# Patient Record
Sex: Male | Born: 1960
Health system: Southern US, Community
[De-identification: ages and names within clinical notes are randomized; demographics above are authoritative.]

## PROBLEM LIST (undated history)

## (undated) DIAGNOSIS — I10 Essential (primary) hypertension: Secondary | ICD-10-CM

## (undated) DIAGNOSIS — M5106 Intervertebral disc disorders with myelopathy, lumbar region: Secondary | ICD-10-CM

## (undated) DIAGNOSIS — G5621 Lesion of ulnar nerve, right upper limb: Secondary | ICD-10-CM

## (undated) DIAGNOSIS — M961 Postlaminectomy syndrome, not elsewhere classified: Secondary | ICD-10-CM

## (undated) DIAGNOSIS — M109 Gout, unspecified: Secondary | ICD-10-CM

## (undated) DIAGNOSIS — M7581 Other shoulder lesions, right shoulder: Secondary | ICD-10-CM

## (undated) DIAGNOSIS — N189 Chronic kidney disease, unspecified: Secondary | ICD-10-CM

## (undated) DIAGNOSIS — M24111 Other articular cartilage disorders, right shoulder: Secondary | ICD-10-CM

## (undated) DIAGNOSIS — N400 Enlarged prostate without lower urinary tract symptoms: Secondary | ICD-10-CM

## (undated) DIAGNOSIS — M5431 Sciatica, right side: Secondary | ICD-10-CM

## (undated) DIAGNOSIS — M51369 Other intervertebral disc degeneration, lumbar region without mention of lumbar back pain or lower extremity pain: Secondary | ICD-10-CM

## (undated) DIAGNOSIS — Z87442 Personal history of urinary calculi: Secondary | ICD-10-CM

## (undated) DIAGNOSIS — E785 Hyperlipidemia, unspecified: Secondary | ICD-10-CM

## (undated) HISTORY — PX: LUMBAR FUSION: SHX111

## (undated) HISTORY — PX: CHOLECYSTECTOMY: SHX55

## (undated) HISTORY — PX: BACK SURGERY: SHX140

---

## 2001-10-03 HISTORY — PX: SHOULDER SURGERY: SHX246

## 2008-11-30 ENCOUNTER — Emergency Department: Payer: Self-pay | Admitting: Emergency Medicine

## 2009-10-14 ENCOUNTER — Ambulatory Visit: Payer: Self-pay | Admitting: General Practice

## 2011-01-09 ENCOUNTER — Emergency Department: Payer: Self-pay | Admitting: Emergency Medicine

## 2011-01-11 ENCOUNTER — Emergency Department: Payer: Self-pay | Admitting: Emergency Medicine

## 2011-05-27 ENCOUNTER — Ambulatory Visit: Payer: Self-pay | Admitting: Urology

## 2011-09-22 ENCOUNTER — Emergency Department: Payer: Self-pay | Admitting: Internal Medicine

## 2011-11-07 ENCOUNTER — Ambulatory Visit: Payer: Self-pay | Admitting: Urology

## 2012-02-08 ENCOUNTER — Ambulatory Visit: Payer: Self-pay | Admitting: General Practice

## 2012-06-25 ENCOUNTER — Ambulatory Visit: Payer: Self-pay | Admitting: General Practice

## 2012-07-04 ENCOUNTER — Emergency Department: Payer: Self-pay | Admitting: Unknown Physician Specialty

## 2012-07-04 LAB — BASIC METABOLIC PANEL
Calcium, Total: 8.9 mg/dL (ref 8.5–10.1)
Co2: 19 mmol/L — ABNORMAL LOW (ref 21–32)
EGFR (Non-African Amer.): 58 — ABNORMAL LOW
Osmolality: 285 (ref 275–301)
Potassium: 4.4 mmol/L (ref 3.5–5.1)
Sodium: 140 mmol/L (ref 136–145)

## 2012-07-04 LAB — URINALYSIS, COMPLETE
Bacteria: NONE SEEN
Bilirubin,UR: NEGATIVE
Glucose,UR: NEGATIVE mg/dL (ref 0–75)
Ketone: NEGATIVE
Nitrite: NEGATIVE
Ph: 5 (ref 4.5–8.0)
Specific Gravity: 1.02 (ref 1.003–1.030)
Squamous Epithelial: <1
WBC UR: 2 /HPF (ref 0–5)

## 2012-07-04 LAB — CBC
MCH: 29.5 pg (ref 26.0–34.0)
MCHC: 34.2 g/dL (ref 32.0–36.0)
MCV: 86 fL (ref 80–100)
Platelet: 294 10*3/uL (ref 150–440)
RDW: 14.7 % — ABNORMAL HIGH (ref 11.5–14.5)

## 2012-07-07 ENCOUNTER — Emergency Department: Payer: Self-pay | Admitting: Unknown Physician Specialty

## 2012-07-07 LAB — URINALYSIS, COMPLETE
Bacteria: NONE SEEN
Bilirubin,UR: NEGATIVE
Glucose,UR: NEGATIVE mg/dL (ref 0–75)
Ketone: NEGATIVE
Leukocyte Esterase: NEGATIVE
Nitrite: NEGATIVE
Ph: 5 (ref 4.5–8.0)
Specific Gravity: 1.02 (ref 1.003–1.030)
Squamous Epithelial: NONE SEEN
WBC UR: NONE SEEN /HPF (ref 0–5)

## 2012-07-07 LAB — COMPREHENSIVE METABOLIC PANEL
Albumin: 3.4 g/dL (ref 3.4–5.0)
Alkaline Phosphatase: 86 U/L (ref 50–136)
Chloride: 110 mmol/L — ABNORMAL HIGH (ref 98–107)
Co2: 24 mmol/L (ref 21–32)
Creatinine: 1.66 mg/dL — ABNORMAL HIGH (ref 0.60–1.30)
EGFR (African American): 54 — ABNORMAL LOW
EGFR (Non-African Amer.): 47 — ABNORMAL LOW
Osmolality: 291 (ref 275–301)
SGOT(AST): 22 U/L (ref 15–37)
SGPT (ALT): 33 U/L (ref 12–78)
Sodium: 144 mmol/L (ref 136–145)
Total Protein: 6.9 g/dL (ref 6.4–8.2)

## 2012-07-07 LAB — CBC
HCT: 42.3 % (ref 40.0–52.0)
HGB: 14.4 g/dL (ref 13.0–18.0)
MCHC: 34 g/dL (ref 32.0–36.0)
RDW: 14.6 % — ABNORMAL HIGH (ref 11.5–14.5)
WBC: 12.1 10*3/uL — ABNORMAL HIGH (ref 3.8–10.6)

## 2012-07-11 ENCOUNTER — Ambulatory Visit: Payer: Self-pay | Admitting: General Practice

## 2013-01-20 ENCOUNTER — Emergency Department: Payer: Self-pay | Admitting: Unknown Physician Specialty

## 2013-01-20 LAB — URINALYSIS, COMPLETE
Bacteria: NONE SEEN
Leukocyte Esterase: NEGATIVE
Ph: 5 (ref 4.5–8.0)
Protein: NEGATIVE
Specific Gravity: 1.021 (ref 1.003–1.030)
Squamous Epithelial: NONE SEEN
WBC UR: 3 /HPF (ref 0–5)

## 2013-01-20 LAB — BASIC METABOLIC PANEL
Anion Gap: 7 (ref 7–16)
Chloride: 107 mmol/L (ref 98–107)
Co2: 23 mmol/L (ref 21–32)
Creatinine: 1.88 mg/dL — ABNORMAL HIGH (ref 0.60–1.30)
Glucose: 134 mg/dL — ABNORMAL HIGH (ref 65–99)
Potassium: 4 mmol/L (ref 3.5–5.1)
Sodium: 137 mmol/L (ref 136–145)

## 2013-01-20 LAB — CBC
HCT: 48.5 % (ref 40.0–52.0)
HGB: 16.3 g/dL (ref 13.0–18.0)
MCHC: 33.6 g/dL (ref 32.0–36.0)
MCV: 87 fL (ref 80–100)
RDW: 15.8 % — ABNORMAL HIGH (ref 11.5–14.5)
WBC: 15.9 10*3/uL — ABNORMAL HIGH (ref 3.8–10.6)

## 2013-01-21 ENCOUNTER — Emergency Department: Payer: Self-pay | Admitting: Emergency Medicine

## 2013-01-23 ENCOUNTER — Ambulatory Visit: Payer: Self-pay | Admitting: Urology

## 2013-01-24 ENCOUNTER — Ambulatory Visit: Payer: Self-pay | Admitting: Urology

## 2014-02-10 ENCOUNTER — Emergency Department: Payer: Self-pay | Admitting: Emergency Medicine

## 2014-02-10 LAB — COMPREHENSIVE METABOLIC PANEL
ALK PHOS: 76 U/L
ALT: 39 U/L (ref 12–78)
ANION GAP: 7 (ref 7–16)
Albumin: 4.1 g/dL (ref 3.4–5.0)
BILIRUBIN TOTAL: 1.6 mg/dL — AB (ref 0.2–1.0)
BUN: 16 mg/dL (ref 7–18)
CHLORIDE: 109 mmol/L — AB (ref 98–107)
CO2: 23 mmol/L (ref 21–32)
CREATININE: 1.52 mg/dL — AB (ref 0.60–1.30)
Calcium, Total: 9.3 mg/dL (ref 8.5–10.1)
EGFR (African American): 60 — ABNORMAL LOW
EGFR (Non-African Amer.): 52 — ABNORMAL LOW
GLUCOSE: 142 mg/dL — AB (ref 65–99)
Osmolality: 281 (ref 275–301)
POTASSIUM: 4.4 mmol/L (ref 3.5–5.1)
SGOT(AST): 36 U/L (ref 15–37)
Sodium: 139 mmol/L (ref 136–145)
Total Protein: 7.9 g/dL (ref 6.4–8.2)

## 2014-02-10 LAB — CBC
HCT: 54.6 % — AB (ref 40.0–52.0)
HGB: 17.5 g/dL (ref 13.0–18.0)
MCH: 28.5 pg (ref 26.0–34.0)
MCHC: 32 g/dL (ref 32.0–36.0)
MCV: 89 fL (ref 80–100)
PLATELETS: 301 10*3/uL (ref 150–440)
RBC: 6.12 10*6/uL — ABNORMAL HIGH (ref 4.40–5.90)
RDW: 14.6 % — ABNORMAL HIGH (ref 11.5–14.5)
WBC: 21.8 10*3/uL — ABNORMAL HIGH (ref 3.8–10.6)

## 2014-02-10 LAB — URINALYSIS, COMPLETE
BILIRUBIN, UR: NEGATIVE
Bacteria: NONE SEEN
GLUCOSE, UR: NEGATIVE mg/dL (ref 0–75)
Hyaline Cast: 2
Ketone: NEGATIVE
Leukocyte Esterase: NEGATIVE
Nitrite: NEGATIVE
Ph: 5 (ref 4.5–8.0)
Protein: 30
RBC,UR: 18 /HPF (ref 0–5)
SPECIFIC GRAVITY: 1.017 (ref 1.003–1.030)
Squamous Epithelial: NONE SEEN
WBC UR: 6 /HPF (ref 0–5)

## 2014-02-10 LAB — LIPASE, BLOOD: LIPASE: 119 U/L (ref 73–393)

## 2014-02-12 LAB — URINE CULTURE

## 2014-03-24 ENCOUNTER — Ambulatory Visit: Payer: Self-pay | Admitting: General Practice

## 2014-05-28 ENCOUNTER — Ambulatory Visit: Payer: Self-pay | Admitting: General Practice

## 2014-06-03 ENCOUNTER — Ambulatory Visit: Payer: Self-pay | Admitting: General Practice

## 2015-01-23 NOTE — Op Note (Signed)
PATIENT NAME:  John John [name], John John [name] MR#:  [id number] DATE OF BIRTH:  1961/06/17  DATE OF PROCEDURE:  01/24/2013  PREOPERATIVE DIAGNOSES:  1. Right distal ureteral calculus.  2. Right renal colic.   POSTOPERATIVE DIAGNOSES:  1. Right distal ureteral calculus.  2. Right renal colic.   PROCEDURES:  1. Right ureteroscopy with holmium laser lithotripsy/stone extraction.  2. Placement right ureteral stent.   SURGEON: John C. Lonna CobbStoioff, MD   ASSISTANT: None.   ANESTHESIA: General.   INDICATIONS: John [name] 54 year old male with John [name] history of recurrent stone disease presented with an 8-day history of severe, intermittent right renal colic. He has had 2 Emergency Department visits. He denies fevers or chills. CT remarkable for John [name] 5 mm right distal ureteral calculus. After discussion of treatment options, he has elected ureteroscopy.   DESCRIPTION: He was taken to the Operating Room, where John [name] general anesthetic was administered. He was placed in the low lithotomy position, and his external genitalia were prepped and draped in the usual sterile fashion. John [name] 21 French cystoscope with 30 degree lens was lubricated and passed under direct vision. The urethra was normal in caliber without stricture. The prostate was remarkable for mild to moderate lateral lobe enlargement. Bladder mucosa was closely inspected, and there was no erythema, solid or papillary lesions. The left ureteral orifice was normal in appearance with clear efflux. No efflux was seen from the right ureteral orifice. John [name] 0.035 Glidewire was placed through the cystoscope into the right ureteral orifice and could not be negotiated past the stone into the distal ureter. John [name] 5 French open-ended ureteral catheter was placed over the wire at the ureteral orifice, and the wire was able to be negotiated past the stone into the right renal pelvis. There was John [name] large rush of urine seen after wire placement. The cystoscope was removed, and John [name] 6 French semirigid ureteroscope was  passed per urethra, and the right ureteral orifice was engaged without dilation. The stone was seen in the distal ureter with John [name] fairly significant degree of inflammation. John [name] 365 micron holmium laser fiber was placed through the ureteroscope, and the stone was easily fragmented at John [name] power setting of 5 watts. All fragments were removed with aid of John [name] parachute basket. The ureteroscope was repassed up to the proximal ureter, and no other fragments were seen. Due to the degree of ureteral edema, it was elected to place John [name] stent, and John [name] 6 French/26 cm Microvasive Contour ureteral stent was placed. There was good curl seen in the renal pelvis under fluoroscopy, and the distal end of the stent was well positioned in the bladder under direct vision. The stent was left attached to John [name] string and will be removed at John [name] later date. B and O suppository was placed per rectum. He was taken to PACU in stable condition. There were no complications. EBL minimal.    ____________________________ John CzechScott C. Lonna CobbStoioff, MD scs:OSi D: 01/25/2013 17:26:18 ET T: 01/26/2013 06:43:04 ET JOB#: [id number]  cc: Lorin PicketScott C. Lonna CobbStoioff, MD, <Dictator> John AltesSCOTT C STOIOFF MD ELECTRONICALLY SIGNED 02/06/2013 7:33

## 2015-12-24 ENCOUNTER — Encounter: Payer: Self-pay | Admitting: Emergency Medicine

## 2015-12-24 ENCOUNTER — Emergency Department: Payer: BLUE CROSS/BLUE SHIELD

## 2015-12-24 ENCOUNTER — Emergency Department
Admission: EM | Admit: 2015-12-24 | Discharge: 2015-12-24 | Disposition: A | Discharge status: Home / Self Care | Payer: BLUE CROSS/BLUE SHIELD | Attending: Emergency Medicine | Admitting: Emergency Medicine

## 2015-12-24 DIAGNOSIS — Z79899 Other long term (current) drug therapy: Secondary | ICD-10-CM | POA: Insufficient documentation

## 2015-12-24 DIAGNOSIS — N201 Calculus of ureter: Secondary | ICD-10-CM | POA: Insufficient documentation

## 2015-12-24 DIAGNOSIS — R109 Unspecified abdominal pain: Secondary | ICD-10-CM

## 2015-12-24 HISTORY — DX: Hyperlipidemia, unspecified: E78.5

## 2015-12-24 LAB — CBC WITH DIFFERENTIAL/PLATELET
BASOS PCT: 1 %
Basophils Absolute: 0.1 10*3/uL (ref 0–0.1)
EOS ABS: 0.3 10*3/uL (ref 0–0.7)
EOS PCT: 3 %
HCT: 51.2 % (ref 40.0–52.0)
Hemoglobin: 17.4 g/dL (ref 13.0–18.0)
LYMPHS ABS: 1.5 10*3/uL (ref 1.0–3.6)
Lymphocytes Relative: 14 %
MCH: 29.4 pg (ref 26.0–34.0)
MCHC: 34 g/dL (ref 32.0–36.0)
MCV: 86.6 fL (ref 80.0–100.0)
MONOS PCT: 7 %
Monocytes Absolute: 0.8 10*3/uL (ref 0.2–1.0)
Neutro Abs: 8 10*3/uL — ABNORMAL HIGH (ref 1.4–6.5)
Neutrophils Relative %: 75 %
PLATELETS: 263 10*3/uL (ref 150–440)
RBC: 5.91 MIL/uL — ABNORMAL HIGH (ref 4.40–5.90)
RDW: 14.8 % — AB (ref 11.5–14.5)
WBC: 10.7 10*3/uL — ABNORMAL HIGH (ref 3.8–10.6)

## 2015-12-24 LAB — COMPREHENSIVE METABOLIC PANEL
ALBUMIN: 4.4 g/dL (ref 3.5–5.0)
ALT: 60 U/L (ref 17–63)
ANION GAP: 7 (ref 5–15)
AST: 40 U/L (ref 15–41)
Alkaline Phosphatase: 80 U/L (ref 38–126)
BUN: 17 mg/dL (ref 6–20)
CALCIUM: 9.3 mg/dL (ref 8.9–10.3)
CHLORIDE: 107 mmol/L (ref 101–111)
CO2: 22 mmol/L (ref 22–32)
Creatinine, Ser: 1.49 mg/dL — ABNORMAL HIGH (ref 0.61–1.24)
GFR calc non Af Amer: 51 mL/min — ABNORMAL LOW (ref >60)
GFR, EST AFRICAN AMERICAN: 59 mL/min — AB (ref >60)
Glucose, Bld: 127 mg/dL — ABNORMAL HIGH (ref 65–99)
POTASSIUM: 4.4 mmol/L (ref 3.5–5.1)
SODIUM: 136 mmol/L (ref 135–145)
Total Bilirubin: 1.5 mg/dL — ABNORMAL HIGH (ref 0.3–1.2)
Total Protein: 7.5 g/dL (ref 6.5–8.1)

## 2015-12-24 MED ORDER — IBUPROFEN 800 MG PO TABS: 800.0000 mg | ORAL_TABLET | Freq: Three times a day (TID) | ORAL | Status: DC | PRN

## 2015-12-24 MED ORDER — TAMSULOSIN HCL 0.4 MG PO CAPS
0.4000 mg | ORAL_CAPSULE | Freq: Once | ORAL | Status: AC
  Administered 2015-12-24: 0.4 mg via ORAL
  Filled 2015-12-24: qty 1

## 2015-12-24 MED ORDER — HYDROMORPHONE HCL 1 MG/ML IJ SOLN
1.0000 mg | Freq: Once | INTRAMUSCULAR | Status: AC
  Administered 2015-12-24: 1 mg via INTRAVENOUS

## 2015-12-24 MED ORDER — ONDANSETRON HCL 4 MG/2ML IJ SOLN
INTRAMUSCULAR | Status: AC
  Administered 2015-12-24: 4 mg
  Filled 2015-12-24: qty 2

## 2015-12-24 MED ORDER — TAMSULOSIN HCL 0.4 MG PO CAPS: 0.4000 mg | ORAL_CAPSULE | Freq: Every day | ORAL | Status: DC

## 2015-12-24 MED ORDER — HYDROMORPHONE HCL 1 MG/ML IJ SOLN
INTRAMUSCULAR | Status: AC
  Administered 2015-12-24: 1 mg via INTRAVENOUS
  Filled 2015-12-24: qty 1

## 2015-12-24 MED ORDER — ONDANSETRON HCL 4 MG/2ML IJ SOLN
4.0000 mg | Freq: Once | INTRAMUSCULAR | Status: AC
  Administered 2015-12-24: 4 mg via INTRAVENOUS

## 2015-12-24 MED ORDER — OXYCODONE-ACETAMINOPHEN 5-325 MG PO TABS: 2.0000 | ORAL_TABLET | ORAL | Status: DC | PRN

## 2015-12-24 MED ORDER — ONDANSETRON 4 MG PO TBDP: 4.0000 mg | ORAL_TABLET | Freq: Three times a day (TID) | ORAL | Status: DC | PRN

## 2015-12-24 MED ORDER — SODIUM CHLORIDE 0.9 % IV BOLUS (SEPSIS)
1000.0000 mL | Freq: Once | INTRAVENOUS | Status: AC
  Administered 2015-12-24: 1000 mL via INTRAVENOUS

## 2015-12-24 MED ORDER — ONDANSETRON HCL 4 MG/2ML IJ SOLN
INTRAMUSCULAR | Status: AC
  Filled 2015-12-24: qty 2

## 2015-12-24 MED ORDER — KETOROLAC TROMETHAMINE 30 MG/ML IJ SOLN
30.0000 mg | INTRAMUSCULAR | Status: AC
  Administered 2015-12-24: 30 mg via INTRAVENOUS
  Filled 2015-12-24: qty 1

## 2015-12-24 MED ORDER — HYDROMORPHONE HCL 1 MG/ML IJ SOLN
INTRAMUSCULAR | Status: AC
  Filled 2015-12-24: qty 1

## 2015-12-24 NOTE — ED Provider Notes (Signed)
[facility] Emergency Department Provider Note ____________________________________________  Time seen: Approximately 9:00 AM  I have reviewed the triage vital signs and the nursing notes.   HISTORY  Chief Complaint Flank Pain  HPI John [name] is a 55 y.o. male who is complaining of severe right posterior lateral flank pain radiating into his right groin that has progressively worsened over the past several hours. Patient states that he did have some vomiting at home and was not able to hold any of his pain medications down. Patient has history of multiple kidney stones in which he passes several at home, but occasionally has had have a procedure done to remove a kidney stone. Patient states that this pain feels just like his kidney stone and on a scale of 0-10 is about a 10. Patient denies any dysuria frequency or hematuria, but he says his urine looked normal. Patient has not had any significant recent surgeries are new medications. Patient states that nothing seems to make it better or worse. She also denies any constipation or diarrhea. Patient denies any fever or chills or muscle aches.   Past Medical History  Diagnosis Date  . Hyperlipidemia     There are no active problems to display for this patient.   History reviewed. No pertinent past surgical history.  Current Outpatient Rx  Name  Route  Sig  Dispense  Refill  . atorvastatin (LIPITOR) 40 MG tablet   Oral   Take 40 mg by mouth daily.         . cyclobenzaprine (FLEXERIL) 10 MG tablet   Oral   Take 10 mg by mouth 3 (three) times daily as needed for muscle spasms.         Marland Kitchen ibuprofen (ADVIL,MOTRIN) 200 MG tablet   Oral   Take 400 mg by mouth every 6 (six) hours as needed.         Marland Kitchen oxyCODONE-acetaminophen (PERCOCET/ROXICET) 5-325 MG tablet   Oral   Take 1 tablet by mouth. Every 4-6 hours as needed for kidney stone pain         . ibuprofen (ADVIL,MOTRIN) 800 MG tablet   Oral   Take 1  tablet (800 mg total) by mouth every 8 (eight) hours as needed.   30 tablet   0   . ondansetron (ZOFRAN ODT) 4 MG disintegrating tablet   Oral   Take 1 tablet (4 mg total) by mouth every 8 (eight) hours as needed for nausea or vomiting.   20 tablet   0   . oxyCODONE-acetaminophen (ROXICET) 5-325 MG tablet   Oral   Take 2 tablets by mouth every 4 (four) hours as needed for severe pain.   24 tablet   0   . tamsulosin (FLOMAX) 0.4 MG CAPS capsule   Oral   Take 1 capsule (0.4 mg total) by mouth daily.   14 capsule   0     Allergies Review of patient's allergies indicates no known allergies.  No family history on file.  Social History Social History  Substance Use Topics  . Smoking status: Never Smoker   . Smokeless tobacco: None  . Alcohol Use: No    Review of Systems Constitutional: No fever/chills Eyes: No visual changes. ENT: No sore throat. Cardiovascular: Denies chest pain. Respiratory: Denies shortness of breath. Gastrointestinal: Patient with severe right flank pain that radiates down into the right groin.  Patient with nausea and vomiting..  No diarrhea.  No constipation. Genitourinary: Negative for dysuria, frequency,  or hematuria, but urine is dark. Musculoskeletal: Pain is in his mid right posterior lateral flank.. Skin: Negative for rash. Neurological: Negative for headaches, focal weakness or numbness. 10-point ROS otherwise negative.  ____________________________________________   PHYSICAL EXAM:  VITAL SIGNS: ED Triage Vitals  Enc Vitals Group     BP 12/24/15 0848 159/83 mmHg     Pulse Rate 12/24/15 0848 83     Resp 12/24/15 0848 20     Temp 12/24/15 0856 97.7 F (36.5 C)     Temp Source 12/24/15 0848 Oral     SpO2 12/24/15 0848 100 %     Weight 12/24/15 0848 220 lb (99.791 kg)     Height 12/24/15 0848 6\' 2"  (1.88 m)     Head Cir --      Peak Flow --      Pain Score 12/24/15 0848 10     Pain Loc --      Pain Edu? --      Excl. in GC?  --     Constitutional: Alert and oriented. Well appearing but in significant distress secondary to his pain. Eyes: Conjunctivae are normal. PERRL. EOMI. Head: Atraumatic. Nose: No congestion/rhinnorhea. Mouth/Throat: Mucous membranes are moist.  Oropharynx non-erythematous. Neck: No stridor.   Cardiovascular: Normal rate, regular rhythm. Grossly normal heart sounds.  Good peripheral circulation. Respiratory: Normal respiratory effort.  No retractions. Lungs CTAB. Gastrointestinal: Soft and nontender. No distention. No abdominal bruits. No CVA tenderness. The patient was significant tenderness palpation to his posterior lateral right flank into his right lower quadrant. There is no rebound guarding or peritoneal signs. Musculoskeletal: No lower extremity tenderness nor edema.  No joint effusions. Neurologic:  Normal speech and language. No gross focal neurologic deficits are appreciated. No gait instability. Skin:  Skin is warm, dry and intact. No rash noted. Psychiatric: Mood and affect are normal. Speech and behavior are normal.  ____________________________________________   LABS (all labs ordered are listed, but only abnormal results are displayed)  Labs Reviewed  CBC WITH DIFFERENTIAL/PLATELET - Abnormal; Notable for the following:    WBC 10.7 (*)    RBC 5.91 (*)    RDW 14.8 (*)    Neutro Abs 8.0 (*)    All other components within normal limits  COMPREHENSIVE METABOLIC PANEL - Abnormal; Notable for the following:    Glucose, Bld 127 (*)    Creatinine, Ser 1.49 (*)    Total Bilirubin 1.5 (*)    GFR calc non Af Amer 51 (*)    GFR calc Af Amer 59 (*)    All other components within normal limits  URINALYSIS COMPLETEWITH MICROSCOPIC ([facility] ONLY)   ____________________________________________  EKG  None ____________________________________________  RADIOLOGY Ct Renal Stone Study  12/24/2015  CLINICAL DATA:  Left-sided flank pain for several hours, initial encounter EXAM:  CT ABDOMEN AND PELVIS WITHOUT CONTRAST TECHNIQUE: Multidetector CT imaging of the abdomen and pelvis was performed following the standard protocol without IV contrast. COMPARISON:  02/10/2014 FINDINGS: Lung bases are free of acute infiltrate or sizable effusion. The gallbladder has been surgically removed. The liver, spleen, adrenal glands and pancreas are within normal limits. The left kidney demonstrates multiple nonobstructing renal calculi measuring approximately 5 mm. No obstructive changes are seen. Cystic lesions are noted in the left kidney stable from the previous exam. The right kidney also shows cystic changes and multiple nonobstructing renal calculi. The largest of these measures approximately 4 mm. Dilatation of the collecting system is noted which extends to the level  of the mid to distal ureter. The mid ureter there are 2 obstructing stones identified. The larger of these measures 6 mm and is noted inferiorly causing the obstructive change. A smaller 3 mm stone is noted proximal. The more distal right ureter is within normal limits. The bladder is well distended. Diverticulosis is seen without evidence of diverticulitis. The appendix is unremarkable. No acute bony abnormality is seen. IMPRESSION: Bilateral renal cystic change and nonobstructing renal calculi as described. Two mid right ureteral stones with obstructive change Electronically Signed   By: Alcide Clever M.D.   On: 12/24/2015 09:43     ____________________________________________   PROCEDURES  Procedure(s) performed: None  Critical Care performed: No  ____________________________________________   INITIAL IMPRESSION / ASSESSMENT AND PLAN / ED COURSE  Pertinent labs & imaging results that were available during my care of the patient were reviewed by me and considered in my medical decision making (see chart for details). On arrival to the ER patient was given IV Dilaudid and Zofran for pain as well as started on a liter  of IV fluids. We'll get routine labs and urine and CT renal stone study to evaluate. 1:28 PM Dr. Len Childs was consulted approximately one hour ago for further evaluation of this patient but is still in surgery at this time. Patient was given another dose of pain and nausea medicine and we are awaiting his consult. Patient has 2 right ureter stones at this time and is still having a significant amount of pain.  1:28 PM Dr. Lonna Cobb was  consulted and he feels that we can go ahead and give patient some IV Toradol as well as some Flomax to see if we can get his pain under control. If we are able to get his pain under control and he feels the patient will be able to just follow-up as an outpatient.  1:28 PM Patient's pain is under control after the IV Toradol. Patient will be placed on by mouth Toradol, Percocet, Zofran, and Flomax at home. Patient was told to follow up with Dr. Rob Bunting L over the next couple days, and return immediately if condition worsens. ____________________________________________   FINAL CLINICAL IMPRESSION(S) / ED DIAGNOSES  Final diagnoses:  Right flank pain  Right ureteral stone      Leona Carry, MD 12/24/15 1329

## 2015-12-24 NOTE — ED Notes (Signed)
Pt reports right flank pain radiating into testicles. Pt appears uncomfortable, unable to stay still and grimacing.  See med note.  Small amount of blood noted in urine this am.  Reports long hx of kidney stones.

## 2015-12-24 NOTE — ED Notes (Signed)
AAOx3.  Skin warm and dry.  Ambulates with easy and steady gait.  Posture relaxed.   

## 2015-12-24 NOTE — ED Notes (Signed)
Pt to ed with c/o left flank pain acute onset at 530 this am.  Pt reports vomiting and difficulty urinating as well.  Hx of kidney stones.

## 2015-12-24 NOTE — ED Notes (Signed)
Pt reports pain got much better after dilaudid but pain returned.  New order for dilaudid given.

## 2015-12-26 DIAGNOSIS — R109 Unspecified abdominal pain: Secondary | ICD-10-CM | POA: Diagnosis present

## 2015-12-26 DIAGNOSIS — Z79899 Other long term (current) drug therapy: Secondary | ICD-10-CM | POA: Insufficient documentation

## 2015-12-26 DIAGNOSIS — N200 Calculus of kidney: Secondary | ICD-10-CM | POA: Insufficient documentation

## 2015-12-26 MED ORDER — KETOROLAC TROMETHAMINE 30 MG/ML IJ SOLN
INTRAMUSCULAR | Status: AC
  Administered 2015-12-26: 30 mg via INTRAVENOUS
  Filled 2015-12-26: qty 1

## 2015-12-26 MED ORDER — HYDROMORPHONE HCL 1 MG/ML IJ SOLN
1.0000 mg | Freq: Once | INTRAMUSCULAR | Status: AC
  Administered 2015-12-26: 1 mg via INTRAVENOUS

## 2015-12-26 MED ORDER — HYDROMORPHONE HCL 1 MG/ML IJ SOLN
INTRAMUSCULAR | Status: AC
  Administered 2015-12-26: 1 mg via INTRAVENOUS
  Filled 2015-12-26: qty 1

## 2015-12-26 MED ORDER — KETOROLAC TROMETHAMINE 30 MG/ML IJ SOLN
30.0000 mg | Freq: Once | INTRAMUSCULAR | Status: AC
  Administered 2015-12-26: 30 mg via INTRAVENOUS

## 2015-12-26 NOTE — ED Notes (Signed)
Pt reports seen here on Thursday and dx'd with 6mm and 3 mm kidney stone on the right. Has appoint to see Dr Lonna CobbStoioff on Tuesday. Pt reports pain became severe to his right flank area and radiating into his groin over the last few hours. Pt last took percocet x 2 tabs at 6 pm.

## 2015-12-27 ENCOUNTER — Emergency Department
Admission: EM | Admit: 2015-12-27 | Discharge: 2015-12-27 | Disposition: A | Discharge status: Home / Self Care | Payer: BLUE CROSS/BLUE SHIELD | Attending: Emergency Medicine | Admitting: Emergency Medicine

## 2015-12-27 DIAGNOSIS — N200 Calculus of kidney: Secondary | ICD-10-CM

## 2015-12-27 DIAGNOSIS — R109 Unspecified abdominal pain: Secondary | ICD-10-CM

## 2015-12-27 LAB — CBC WITH DIFFERENTIAL/PLATELET
BASOS ABS: 0.1 10*3/uL (ref 0–0.1)
Basophils Relative: 0 %
Eosinophils Absolute: 0.3 10*3/uL (ref 0–0.7)
Eosinophils Relative: 2 %
HEMATOCRIT: 40.8 % (ref 40.0–52.0)
Hemoglobin: 13.7 g/dL (ref 13.0–18.0)
LYMPHS PCT: 10 %
Lymphs Abs: 1.4 10*3/uL (ref 1.0–3.6)
MCH: 29.2 pg (ref 26.0–34.0)
MCHC: 33.5 g/dL (ref 32.0–36.0)
MCV: 87.2 fL (ref 80.0–100.0)
Monocytes Absolute: 1.3 10*3/uL — ABNORMAL HIGH (ref 0.2–1.0)
Monocytes Relative: 9 %
NEUTROS ABS: 10.3 10*3/uL — AB (ref 1.4–6.5)
NEUTROS PCT: 77 %
PLATELETS: 221 10*3/uL (ref 150–440)
RBC: 4.68 MIL/uL (ref 4.40–5.90)
RDW: 14.2 % (ref 11.5–14.5)
WBC: 13.3 10*3/uL — AB (ref 3.8–10.6)

## 2015-12-27 LAB — BASIC METABOLIC PANEL
ANION GAP: 3 — AB (ref 5–15)
BUN: 19 mg/dL (ref 6–20)
CO2: 23 mmol/L (ref 22–32)
Calcium: 8 mg/dL — ABNORMAL LOW (ref 8.9–10.3)
Chloride: 110 mmol/L (ref 101–111)
Creatinine, Ser: 2.28 mg/dL — ABNORMAL HIGH (ref 0.61–1.24)
GFR calc Af Amer: 35 mL/min — ABNORMAL LOW (ref >60)
GFR, EST NON AFRICAN AMERICAN: 31 mL/min — AB (ref >60)
GLUCOSE: 108 mg/dL — AB (ref 65–99)
POTASSIUM: 4.7 mmol/L (ref 3.5–5.1)
Sodium: 136 mmol/L (ref 135–145)

## 2015-12-27 LAB — URINALYSIS COMPLETE WITH MICROSCOPIC (ARMC ONLY)
BILIRUBIN URINE: NEGATIVE
Bacteria, UA: NONE SEEN
Glucose, UA: NEGATIVE mg/dL
Ketones, ur: NEGATIVE mg/dL
LEUKOCYTES UA: NEGATIVE
Nitrite: NEGATIVE
PH: 5 (ref 5.0–8.0)
Protein, ur: NEGATIVE mg/dL
SPECIFIC GRAVITY, URINE: 1.017 (ref 1.005–1.030)
SQUAMOUS EPITHELIAL / LPF: NONE SEEN
WBC, UA: NONE SEEN WBC/hpf (ref 0–5)

## 2015-12-27 MED ORDER — KETOROLAC TROMETHAMINE 30 MG/ML IJ SOLN
30.0000 mg | Freq: Once | INTRAMUSCULAR | Status: DC
  Filled 2015-12-27: qty 1

## 2015-12-27 MED ORDER — HYDROMORPHONE HCL 2 MG PO TABS
2.0000 mg | ORAL_TABLET | Freq: Once | ORAL | Status: AC
  Administered 2015-12-27: 2 mg via ORAL
  Filled 2015-12-27: qty 1

## 2015-12-27 MED ORDER — ONDANSETRON HCL 4 MG/2ML IJ SOLN
4.0000 mg | Freq: Once | INTRAMUSCULAR | Status: AC
  Administered 2015-12-27: 4 mg via INTRAVENOUS
  Filled 2015-12-27: qty 2

## 2015-12-27 MED ORDER — SODIUM CHLORIDE 0.9 % IV BOLUS (SEPSIS)
1000.0000 mL | Freq: Once | INTRAVENOUS | Status: AC
  Administered 2015-12-27: 1000 mL via INTRAVENOUS

## 2015-12-27 MED ORDER — HYDROMORPHONE HCL 2 MG PO TABS: 2.0000 mg | ORAL_TABLET | Freq: Four times a day (QID) | ORAL | Status: DC | PRN

## 2015-12-27 MED ORDER — HYDROMORPHONE HCL 1 MG/ML IJ SOLN
1.0000 mg | Freq: Once | INTRAMUSCULAR | Status: AC
  Administered 2015-12-27: 1 mg via INTRAVENOUS
  Filled 2015-12-27: qty 1

## 2015-12-27 NOTE — Discharge Instructions (Signed)
1. Discontinue toradol tablets. You may alternate Dilaudid tablets (#30) with Percocet to control your pain. 2. Drink plenty of fluids daily.  3. Return to the ER for worsening symptoms, persistent vomiting, fever, or other concerns.   Flank Pain Flank pain refers to pain that is located on the side of the body between the upper abdomen and the back. The pain may occur over a short period of time (acute) or may be long-term or reoccurring (chronic). It may be mild or severe. Flank pain can be caused by many things. CAUSES  Some of the more common causes of flank pain include:  Muscle strains.   Muscle spasms.   A disease of your spine (vertebral disk disease).   A lung infection (pneumonia).   Fluid around your lungs (pulmonary edema).   A kidney infection.   Kidney stones.   A very painful skin rash caused by the chickenpox virus (shingles).   Gallbladder disease.  HOME CARE INSTRUCTIONS  Home care will depend on the cause of your pain. In general,  Rest as directed by your caregiver.  Drink enough fluids to keep your urine clear or pale yellow.  Only take over-the-counter or prescription medicines as directed by your caregiver. Some medicines may help relieve the pain.  Tell your caregiver about any changes in your pain.  Follow up with your caregiver as directed. SEEK IMMEDIATE MEDICAL CARE IF:   Your pain is not controlled with medicine.   You have new or worsening symptoms.  Your pain increases.   You have abdominal pain.   You have shortness of breath.   You have persistent nausea or vomiting.   You have swelling in your abdomen.   You feel faint or pass out.   You have blood in your urine.  You have a fever or persistent symptoms for more than 2-3 days.  You have a fever and your symptoms suddenly get worse. MAKE SURE YOU:   Understand these instructions.  Will watch your condition.  Will get help right away if you are not  doing well or get worse.   This information is not intended to replace advice given to you by your health care provider. Make sure you discuss any questions you have with your health care provider.   Document Released: 11/10/2005 Document Revised: 06/13/2012 Document Reviewed: 05/03/2012 Elsevier Interactive Patient Education 2016 [employer]  Kidney Stones Kidney stones (urolithiasis) are deposits that form inside your kidneys. The intense pain is caused by the stone moving through the urinary tract. When the stone moves, the ureter goes into spasm around the stone. The stone is usually passed in the urine.  CAUSES   A disorder that makes certain neck glands produce too much parathyroid hormone (primary hyperparathyroidism).  A buildup of uric acid crystals, similar to gout in your joints.  Narrowing (stricture) of the ureter.  A kidney obstruction present at birth (congenital obstruction).  Previous surgery on the kidney or ureters.  Numerous kidney infections. SYMPTOMS   Feeling sick to your stomach (nauseous).  Throwing up (vomiting).  Blood in the urine (hematuria).  Pain that usually spreads (radiates) to the groin.  Frequency or urgency of urination. DIAGNOSIS   Taking a history and physical exam.  Blood or urine tests.  CT scan.  Occasionally, an examination of the inside of the urinary bladder (cystoscopy) is performed. TREATMENT   Observation.  Increasing your fluid intake.  Extracorporeal shock wave lithotripsy--This is a noninvasive procedure that uses shock waves  to break up kidney stones.  Surgery may be needed if you have severe pain or persistent obstruction. There are various surgical procedures. Most of the procedures are performed with the use of small instruments. Only small incisions are needed to accommodate these instruments, so recovery time is minimized. The size, location, and chemical composition are all important variables that will  determine the proper choice of action for you. Talk to your health care provider to better understand your situation so that you will minimize the risk of injury to yourself and your kidney.  HOME CARE INSTRUCTIONS   Drink enough water and fluids to keep your urine clear or pale yellow. This will help you to pass the stone or stone fragments.  Strain all urine through the provided strainer. Keep all particulate matter and stones for your health care provider to see. The stone causing the pain may be as small as a grain of salt. It is very important to use the strainer each and every time you pass your urine. The collection of your stone will allow your health care provider to analyze it and verify that a stone has actually passed. The stone analysis will often identify what you can do to reduce the incidence of recurrences.  Only take over-the-counter or prescription medicines for pain, discomfort, or fever as directed by your health care provider.  Keep all follow-up visits as told by your health care provider. This is important.  Get follow-up X-rays if required. The absence of pain does not always mean that the stone has passed. It may have only stopped moving. If the urine remains completely obstructed, it can cause loss of kidney function or even complete destruction of the kidney. It is your responsibility to make sure X-rays and follow-ups are completed. Ultrasounds of the kidney can show blockages and the status of the kidney. Ultrasounds are not associated with any radiation and can be performed easily in a matter of minutes.  Make changes to your daily diet as told by your health care provider. You may be told to:  Limit the amount of salt that you eat.  Eat 5 or more servings of fruits and vegetables each day.  Limit the amount of meat, poultry, fish, and eggs that you eat.  Collect a 24-hour urine sample as told by your health care provider.You may need to collect another urine  sample every 6-12 months. SEEK MEDICAL CARE IF:  You experience pain that is progressive and unresponsive to any pain medicine you have been prescribed. SEEK IMMEDIATE MEDICAL CARE IF:   Pain cannot be controlled with the prescribed medicine.  You have a fever or shaking chills.  The severity or intensity of pain increases over 18 hours and is not relieved by pain medicine.  You develop a new onset of abdominal pain.  You feel faint or pass out.  You are unable to urinate.   This information is not intended to replace advice given to you by your health care provider. Make sure you discuss any questions you have with your health care provider.   Document Released: 09/19/2005 Document Revised: 06/10/2015 Document Reviewed: 02/20/2013 Elsevier Interactive Patient Education [employer]2016 [employer]

## 2015-12-27 NOTE — ED Provider Notes (Signed)
[facility] Emergency Department Provider Note  ____________________________________________  Time seen: Approximately 12:52 AM  I have reviewed the triage vital signs and the nursing notes.   HISTORY  Chief Complaint Flank Pain and Nephrolithiasis    HPI John [name] is a 55 y.o. male who returns to the ED from home with a chief complaint of right flank pain. Patient has a history of kidney stones requiring lithotripsy (last in 2014) who was seen in the ED on 3/23 for similar complaint. On CT scan at that time, he was found to have 2 ureteral stones measuring 3 mm and 6 mm in size. Has a scheduled appointment with urologist in 3 days. Reports increased pain at home despite Percocet and Toradol use. Denies associated fever, chills, chest pain, shortness of breath, abdominal pain, urinary symptoms, nausea, vomiting, diarrhea, testicular pain or swelling. Denies recent travel or trauma. Nothing makes his pain better or worse.   Past Medical History  Diagnosis Date  . Hyperlipidemia   Nephrolithiasis  There are no active problems to display for this patient.   Past surgical history Lithotripsy  Current Outpatient Rx  Name  Route  Sig  Dispense  Refill  . atorvastatin (LIPITOR) 40 MG tablet   Oral   Take 40 mg by mouth daily.         . cyclobenzaprine (FLEXERIL) 10 MG tablet   Oral   Take 10 mg by mouth 3 (three) times daily as needed for muscle spasms.         Marland Kitchen ibuprofen (ADVIL,MOTRIN) 800 MG tablet   Oral   Take 1 tablet (800 mg total) by mouth every 8 (eight) hours as needed.   30 tablet   0   . ondansetron (ZOFRAN ODT) 4 MG disintegrating tablet   Oral   Take 1 tablet (4 mg total) by mouth every 8 (eight) hours as needed for nausea or vomiting.   20 tablet   0   . oxyCODONE-acetaminophen (ROXICET) 5-325 MG tablet   Oral   Take 2 tablets by mouth every 4 (four) hours as needed for severe pain.   24 tablet   0   . tamsulosin (FLOMAX)  0.4 MG CAPS capsule   Oral   Take 1 capsule (0.4 mg total) by mouth daily.   14 capsule   0     Allergies Review of patient's allergies indicates no known allergies.  Family history Father with nephrolithiasis  Social History Social History  Substance Use Topics  . Smoking status: Never Smoker   . Smokeless tobacco: Not on file  . Alcohol Use: No    Review of Systems  Constitutional: No fever/chills. Eyes: No visual changes. ENT: No sore throat. Cardiovascular: Denies chest pain. Respiratory: Denies shortness of breath. Gastrointestinal: No abdominal pain.  No nausea, no vomiting.  No diarrhea.  No constipation. Positive for flank pain. Genitourinary: Negative for dysuria. Musculoskeletal: Negative for back pain. Skin: Negative for rash. Neurological: Negative for headaches, focal weakness or numbness.  10-point ROS otherwise negative.  ____________________________________________   PHYSICAL EXAM:  VITAL SIGNS: ED Triage Vitals  Enc Vitals Group     BP 12/26/15 2140 156/97 mmHg     Pulse Rate 12/26/15 2140 112     Resp 12/26/15 2140 18     Temp 12/26/15 2140 98.2 F (36.8 C)     Temp Source 12/26/15 2140 Oral     SpO2 12/26/15 2140 96 %     Weight 12/26/15 2147 225  lb (102.059 kg)     Height 12/26/15 2147 6' (1.829 m)     Head Cir --      Peak Flow --      Pain Score 12/26/15 2147 10     Pain Loc --      Pain Edu? --      Excl. in GC? --     Constitutional: Alert and oriented. Well appearing and in no acute distress. Eyes: Conjunctivae are normal. PERRL. EOMI. Head: Atraumatic. Nose: No congestion/rhinnorhea. Mouth/Throat: Mucous membranes are moist.  Oropharynx non-erythematous. Neck: No stridor.   Cardiovascular: Normal rate, regular rhythm. Grossly normal heart sounds.  Good peripheral circulation. Respiratory: Normal respiratory effort.  No retractions. Lungs CTAB. Gastrointestinal: Soft and nontender. No distention. No abdominal bruits. No  CVA tenderness. Musculoskeletal: No lower extremity tenderness nor edema.  No joint effusions. Neurologic:  Normal speech and language. No gross focal neurologic deficits are appreciated. No gait instability. Skin:  Skin is warm, dry and intact. No rash noted. Psychiatric: Mood and affect are normal. Speech and behavior are normal.  ____________________________________________   LABS (all labs ordered are listed, but only abnormal results are displayed)  Labs Reviewed  CBC WITH DIFFERENTIAL/PLATELET - Abnormal; Notable for the following:    WBC 13.3 (*)    Neutro Abs 10.3 (*)    Monocytes Absolute 1.3 (*)    All other components within normal limits  BASIC METABOLIC PANEL - Abnormal; Notable for the following:    Glucose, Bld 108 (*)    Creatinine, Ser 2.28 (*)    Calcium 8.0 (*)    GFR calc non Af Amer 31 (*)    GFR calc Af Amer 35 (*)    Anion gap 3 (*)    All other components within normal limits  URINALYSIS COMPLETEWITH MICROSCOPIC ([facility] ONLY) - Abnormal; Notable for the following:    Color, Urine YELLOW (*)    APPearance CLEAR (*)    Hgb urine dipstick 1+ (*)    All other components within normal limits   ____________________________________________  EKG  None ____________________________________________  RADIOLOGY  None ____________________________________________   PROCEDURES  Procedure(s) performed: None  Critical Care performed: No  ____________________________________________   INITIAL IMPRESSION / ASSESSMENT AND PLAN / ED COURSE  Pertinent labs & imaging results that were available during my care of the patient were reviewed by me and considered in my medical decision making (see chart for details).  55 year old male with a history of nephrolithiasis who returns to the ED for pain control. He has known 3 mm and 6 mm right ureteral stones on CT scan obtained several days ago. He is afebrile, not vomiting. Will repeat blood work to evaluate kidney  function, urinalysis as this was not done on prior visit, initiate IV fluid resuscitation. Noted patient received a dose of IV Toradol and Dilaudid prior to being placed in treatment room which has partially relieved his pain. Will give additional dose of Dilaudid coupled with Zofran and reassess.  ----------------------------------------- 2:30 AM on 12/27/2015 -----------------------------------------  Patient improved and is eager for discharge. I discussed with Dr. Apolinar Junes (on-call urology) patient's bump in creatinine. She recommends increased oral hydration, avoidance of NSAIDs and close follow-up with Dr. Lonna Cobb. I have instructed patient to stop taking Toradol; will write prescription for Dilaudid tablets to be alternated with his Percocet. Patient is to call Dr. Heywood Footman office on Monday to see if he can be fit in sooner. Strict return precautions given. Patient and spouse verbalize understanding  and agree with plan of care. ____________________________________________   FINAL CLINICAL IMPRESSION(S) / ED DIAGNOSES  Final diagnoses:  Kidney stones  Flank pain      Irean HongJade J Clova Morlock, MD 12/27/15 [id number]

## 2015-12-27 NOTE — ED Notes (Signed)
Pt reports being seen Thursday morning, and diagnosed with 2 kidney stones. Pt reports he had a significant increase in pain around 1700 3/25. Pt took 2 oxycodone at 1500, and Zofran and oxycodone at 1830 with no relief. Pt denies frequent urination, oliguria, dysuria, penile discharge. Pt denies current pain and nausea.

## 2015-12-27 NOTE — ED Notes (Signed)
Pt. Going home with wife. 

## 2016-09-09 ENCOUNTER — Other Ambulatory Visit: Payer: Self-pay | Admitting: Surgery

## 2016-09-09 DIAGNOSIS — M7581 Other shoulder lesions, right shoulder: Secondary | ICD-10-CM | POA: Insufficient documentation

## 2016-09-09 DIAGNOSIS — M25511 Pain in right shoulder: Secondary | ICD-10-CM

## 2016-09-09 DIAGNOSIS — M75121 Complete rotator cuff tear or rupture of right shoulder, not specified as traumatic: Secondary | ICD-10-CM

## 2016-09-13 ENCOUNTER — Other Ambulatory Visit: Payer: Self-pay | Admitting: Surgery

## 2016-09-13 DIAGNOSIS — M75121 Complete rotator cuff tear or rupture of right shoulder, not specified as traumatic: Secondary | ICD-10-CM

## 2016-09-13 DIAGNOSIS — Z77018 Contact with and (suspected) exposure to other hazardous metals: Secondary | ICD-10-CM

## 2016-09-21 ENCOUNTER — Ambulatory Visit
Admission: RE | Admit: 2016-09-21 | Discharge: 2016-09-21 | Disposition: A | Discharge status: Home / Self Care | Payer: BLUE CROSS/BLUE SHIELD | Source: Ambulatory Visit | Attending: Surgery | Admitting: Surgery

## 2016-09-21 DIAGNOSIS — Z77018 Contact with and (suspected) exposure to other hazardous metals: Secondary | ICD-10-CM

## 2016-09-21 DIAGNOSIS — M75121 Complete rotator cuff tear or rupture of right shoulder, not specified as traumatic: Secondary | ICD-10-CM

## 2016-09-23 ENCOUNTER — Ambulatory Visit: Payer: BLUE CROSS/BLUE SHIELD

## 2016-10-20 ENCOUNTER — Encounter
Admission: RE | Admit: 2016-10-20 | Discharge: 2016-10-20 | Disposition: A | Discharge status: Home / Self Care | Payer: BLUE CROSS/BLUE SHIELD | Source: Ambulatory Visit | Attending: Surgery | Admitting: Surgery

## 2016-10-20 HISTORY — DX: Personal history of urinary calculi: Z87.442

## 2016-10-20 NOTE — Patient Instructions (Signed)
  Your procedure is scheduled on: Thursday, October 27, 2016 Report to Same Day Surgery 2nd floor medical mall (Medical Mall Entrance-take elevator on left to 2nd floor.  Check in with surgery information desk.) To find out your arrival time please call [phone] between 1PM - 3PM on Wednesday, January 24th  Remember: Instructions that are not followed completely may result in serious medical risk, up to and including death, or upon the discretion of your surgeon and anesthesiologist your surgery may need to be rescheduled.    _x___ 1. Do not eat food or drink liquids after midnight. No gum chewing or hard candies.     __x__ 2. No Alcohol for 24 hours before or after surgery.   __x__3. No Smoking for 24 prior to surgery.   ____  4. Bring all medications with you on the day of surgery if instructed.    __x__ 5. Notify your doctor if there is any change in your medical condition     (cold, fever, infections).     Do not wear jewelry, make-up, hairpins, clips or nail polish.  Do not wear lotions, powders, or perfumes. You may wear deodorant.  Do not shave 48 hours prior to surgery. Men may shave face and neck.  Do not bring valuables to the hospital.    [facility] is not responsible for any belongings or valuables.               Contacts, dentures or bridgework may not be worn into surgery.  Leave your suitcase in the car. After surgery it may be brought to your room.  For patients admitted to the hospital, discharge time is determined by your treatment team.   Patients discharged the day of surgery will not be allowed to drive home.  You will need someone to drive you home and stay with you the night of your procedure.    Please read over the following fact sheets that you were given:   [facility] and or MRSA Information   _x___ Take these medicines the morning of surgery with A SIP OF WATER:    1. NONE  2.  3.  4.  5.  6.  ____Fleets enema or  Magnesium Citrate as directed.   _x___ Use CHG Soap or sage wipes as directed on instruction sheet   ____ Use inhalers on the day of surgery and bring to hospital day of surgery  ____ Stop metformin 2 days prior to surgery    ____ Take 1/2 of usual insulin dose the night before surgery and none on the morning of           surgery.   ____ Stop Aspirin, Coumadin, Pllavix ,Eliquis, Effient, or Pradaxa  x__ Stop Anti-inflammatories such as Advil, Aleve, Ibuprofen, Motrin, Naproxen,          Naprosyn, Goodies powders or aspirin products. Ok to take Tylenol.   ____ Stop supplements until after surgery.    ____ Bring C-Pap to the hospital.    

## 2016-10-27 ENCOUNTER — Encounter: Payer: Self-pay | Admitting: *Deleted

## 2016-10-27 ENCOUNTER — Ambulatory Visit: Payer: BLUE CROSS/BLUE SHIELD | Admitting: Anesthesiology

## 2016-10-27 ENCOUNTER — Encounter
Admission: RE | Disposition: A | Discharge status: Home / Self Care | Payer: Self-pay | Source: Ambulatory Visit | Attending: Surgery

## 2016-10-27 ENCOUNTER — Ambulatory Visit
Admission: RE | Admit: 2016-10-27 | Discharge: 2016-10-27 | Disposition: A | Discharge status: Home / Self Care | Payer: BLUE CROSS/BLUE SHIELD | Source: Ambulatory Visit | Attending: Surgery | Admitting: Surgery

## 2016-10-27 DIAGNOSIS — M25511 Pain in right shoulder: Secondary | ICD-10-CM | POA: Diagnosis present

## 2016-10-27 DIAGNOSIS — E785 Hyperlipidemia, unspecified: Secondary | ICD-10-CM | POA: Diagnosis not present

## 2016-10-27 DIAGNOSIS — M25811 Other specified joint disorders, right shoulder: Secondary | ICD-10-CM | POA: Diagnosis not present

## 2016-10-27 DIAGNOSIS — M24111 Other articular cartilage disorders, right shoulder: Secondary | ICD-10-CM | POA: Insufficient documentation

## 2016-10-27 DIAGNOSIS — X58XXXA Exposure to other specified factors, initial encounter: Secondary | ICD-10-CM | POA: Diagnosis not present

## 2016-10-27 DIAGNOSIS — N189 Chronic kidney disease, unspecified: Secondary | ICD-10-CM | POA: Insufficient documentation

## 2016-10-27 HISTORY — PX: SHOULDER ARTHROSCOPY WITH SUBACROMIAL DECOMPRESSION: SHX5684

## 2016-10-27 SURGERY — SHOULDER ARTHROSCOPY WITH SUBACROMIAL DECOMPRESSION
Anesthesia: Regional | Site: Shoulder | Laterality: Right | Wound class: Clean

## 2016-10-27 MED ORDER — ROPIVACAINE HCL 5 MG/ML IJ SOLN
INTRAMUSCULAR | Status: AC
  Filled 2016-10-27: qty 40

## 2016-10-27 MED ORDER — FENTANYL CITRATE (PF) 100 MCG/2ML IJ SOLN
INTRAMUSCULAR | Status: DC | PRN
  Administered 2016-10-27: 200 ug via INTRAVENOUS
  Administered 2016-10-27: 50 ug via INTRAVENOUS

## 2016-10-27 MED ORDER — EPINEPHRINE PF 1 MG/ML IJ SOLN
INTRAMUSCULAR | Status: DC | PRN
  Administered 2016-10-27: 2 mL

## 2016-10-27 MED ORDER — GLYCOPYRROLATE 0.2 MG/ML IJ SOLN
INTRAMUSCULAR | Status: DC | PRN
  Administered 2016-10-27: 0.2 mg via INTRAVENOUS

## 2016-10-27 MED ORDER — MIDAZOLAM HCL 2 MG/2ML IJ SOLN
INTRAMUSCULAR | Status: DC | PRN
  Administered 2016-10-27: 1 mg via INTRAVENOUS

## 2016-10-27 MED ORDER — ROCURONIUM BROMIDE 100 MG/10ML IV SOLN
INTRAVENOUS | Status: DC | PRN
  Administered 2016-10-27: 50 mg via INTRAVENOUS

## 2016-10-27 MED ORDER — ROPIVACAINE HCL 5 MG/ML IJ SOLN
INTRAMUSCULAR | Status: DC | PRN
  Administered 2016-10-27: 30 mL via PERINEURAL

## 2016-10-27 MED ORDER — LIDOCAINE HCL (PF) 1 % IJ SOLN
INTRAMUSCULAR | Status: AC
  Filled 2016-10-27: qty 5

## 2016-10-27 MED ORDER — METOCLOPRAMIDE HCL 5 MG/ML IJ SOLN: 5.0000 mg | Freq: Three times a day (TID) | INTRAMUSCULAR | Status: DC | PRN

## 2016-10-27 MED ORDER — FENTANYL CITRATE (PF) 100 MCG/2ML IJ SOLN
50.0000 ug | Freq: Once | INTRAMUSCULAR | Status: AC
  Administered 2016-10-27: 50 ug via INTRAVENOUS

## 2016-10-27 MED ORDER — SUGAMMADEX SODIUM 200 MG/2ML IV SOLN
INTRAVENOUS | Status: DC | PRN
  Administered 2016-10-27: 200 mg via INTRAVENOUS

## 2016-10-27 MED ORDER — ONDANSETRON HCL 4 MG PO TABS: 4.0000 mg | ORAL_TABLET | Freq: Four times a day (QID) | ORAL | Status: DC | PRN

## 2016-10-27 MED ORDER — LIDOCAINE HCL (PF) 2 % IJ SOLN
INTRAMUSCULAR | Status: AC
  Filled 2016-10-27: qty 2

## 2016-10-27 MED ORDER — BUPIVACAINE-EPINEPHRINE (PF) 0.25% -1:200000 IJ SOLN
INTRAMUSCULAR | Status: DC | PRN
  Administered 2016-10-27: 30 mL via PERINEURAL

## 2016-10-27 MED ORDER — METOCLOPRAMIDE HCL 10 MG PO TABS: 5.0000 mg | ORAL_TABLET | Freq: Three times a day (TID) | ORAL | Status: DC | PRN

## 2016-10-27 MED ORDER — SUGAMMADEX SODIUM 200 MG/2ML IV SOLN
INTRAVENOUS | Status: AC
  Filled 2016-10-27: qty 2

## 2016-10-27 MED ORDER — MIDAZOLAM HCL 2 MG/2ML IJ SOLN
INTRAMUSCULAR | Status: AC
  Administered 2016-10-27: 1 mg via INTRAVENOUS
  Filled 2016-10-27: qty 2

## 2016-10-27 MED ORDER — CEFAZOLIN SODIUM-DEXTROSE 2-4 GM/100ML-% IV SOLN
INTRAVENOUS | Status: AC
  Filled 2016-10-27: qty 100

## 2016-10-27 MED ORDER — PROMETHAZINE HCL 25 MG/ML IJ SOLN: 6.2500 mg | INTRAMUSCULAR | Status: DC | PRN

## 2016-10-27 MED ORDER — EPHEDRINE SULFATE 50 MG/ML IJ SOLN
INTRAMUSCULAR | Status: DC | PRN
  Administered 2016-10-27: 20 mg via INTRAVENOUS

## 2016-10-27 MED ORDER — LIDOCAINE HCL (PF) 1 % IJ SOLN
INTRAMUSCULAR | Status: DC | PRN
  Administered 2016-10-27: 5 mL

## 2016-10-27 MED ORDER — FENTANYL CITRATE (PF) 250 MCG/5ML IJ SOLN
INTRAMUSCULAR | Status: AC
  Filled 2016-10-27: qty 5

## 2016-10-27 MED ORDER — ONDANSETRON HCL 4 MG/2ML IJ SOLN
INTRAMUSCULAR | Status: DC | PRN
  Administered 2016-10-27: 4 mg via INTRAVENOUS

## 2016-10-27 MED ORDER — PROPOFOL 10 MG/ML IV BOLUS
INTRAVENOUS | Status: AC
  Filled 2016-10-27: qty 20

## 2016-10-27 MED ORDER — BUPIVACAINE-EPINEPHRINE (PF) 0.25% -1:200000 IJ SOLN
INTRAMUSCULAR | Status: AC
  Filled 2016-10-27: qty 30

## 2016-10-27 MED ORDER — DEXAMETHASONE SODIUM PHOSPHATE 4 MG/ML IJ SOLN
INTRAMUSCULAR | Status: DC | PRN
  Administered 2016-10-27: 5 mg via INTRAVENOUS

## 2016-10-27 MED ORDER — FENTANYL CITRATE (PF) 100 MCG/2ML IJ SOLN: 25.0000 ug | INTRAMUSCULAR | Status: DC | PRN

## 2016-10-27 MED ORDER — MIDAZOLAM HCL 2 MG/2ML IJ SOLN
INTRAMUSCULAR | Status: AC
  Filled 2016-10-27: qty 2

## 2016-10-27 MED ORDER — MIDAZOLAM HCL 2 MG/2ML IJ SOLN
1.0000 mg | Freq: Once | INTRAMUSCULAR | Status: AC
  Administered 2016-10-27: 1 mg via INTRAVENOUS

## 2016-10-27 MED ORDER — NALOXONE HCL 0.4 MG/ML IJ SOLN
INTRAMUSCULAR | Status: DC | PRN
  Administered 2016-10-27: 80 ug via INTRAVENOUS

## 2016-10-27 MED ORDER — LACTATED RINGERS IV SOLN
INTRAVENOUS | Status: DC
  Administered 2016-10-27: 10:00:00 via INTRAVENOUS

## 2016-10-27 MED ORDER — KETOROLAC TROMETHAMINE 30 MG/ML IJ SOLN
INTRAMUSCULAR | Status: DC | PRN
  Administered 2016-10-27: 20 mg via INTRAVENOUS

## 2016-10-27 MED ORDER — OXYCODONE HCL 5 MG PO TABS: 5.0000 mg | ORAL_TABLET | ORAL | 0 refills | Status: DC | PRN

## 2016-10-27 MED ORDER — DEXAMETHASONE SODIUM PHOSPHATE 10 MG/ML IJ SOLN
INTRAMUSCULAR | Status: AC
  Filled 2016-10-27: qty 1

## 2016-10-27 MED ORDER — KETOROLAC TROMETHAMINE 30 MG/ML IJ SOLN
INTRAMUSCULAR | Status: AC
  Filled 2016-10-27: qty 1

## 2016-10-27 MED ORDER — EPINEPHRINE PF 1 MG/ML IJ SOLN
INTRAMUSCULAR | Status: AC
  Filled 2016-10-27: qty 2

## 2016-10-27 MED ORDER — CEFAZOLIN SODIUM-DEXTROSE 2-4 GM/100ML-% IV SOLN
2.0000 g | Freq: Once | INTRAVENOUS | Status: AC
  Administered 2016-10-27: 2 g via INTRAVENOUS

## 2016-10-27 MED ORDER — ONDANSETRON HCL 4 MG/2ML IJ SOLN
INTRAMUSCULAR | Status: AC
  Filled 2016-10-27: qty 2

## 2016-10-27 MED ORDER — ROCURONIUM BROMIDE 50 MG/5ML IV SOSY
PREFILLED_SYRINGE | INTRAVENOUS | Status: AC
  Filled 2016-10-27: qty 5

## 2016-10-27 MED ORDER — POTASSIUM CHLORIDE IN NACL 20-0.9 MEQ/L-% IV SOLN: INTRAVENOUS | Status: DC

## 2016-10-27 MED ORDER — PHENYLEPHRINE HCL 10 MG/ML IJ SOLN
INTRAMUSCULAR | Status: DC | PRN
  Administered 2016-10-27: 100 ug via INTRAVENOUS

## 2016-10-27 MED ORDER — FAMOTIDINE 20 MG PO TABS
20.0000 mg | ORAL_TABLET | Freq: Once | ORAL | Status: AC
  Administered 2016-10-27: 20 mg via ORAL

## 2016-10-27 MED ORDER — LIDOCAINE HCL (CARDIAC) 20 MG/ML IV SOLN
INTRAVENOUS | Status: DC | PRN
  Administered 2016-10-27: 100 mg via INTRAVENOUS

## 2016-10-27 MED ORDER — FAMOTIDINE 20 MG PO TABS
ORAL_TABLET | ORAL | Status: AC
  Filled 2016-10-27: qty 1

## 2016-10-27 MED ORDER — PROPOFOL 10 MG/ML IV BOLUS
INTRAVENOUS | Status: DC | PRN
  Administered 2016-10-27: 150 mg via INTRAVENOUS

## 2016-10-27 MED ORDER — ONDANSETRON HCL 4 MG/2ML IJ SOLN: 4.0000 mg | Freq: Four times a day (QID) | INTRAMUSCULAR | Status: DC | PRN

## 2016-10-27 MED ORDER — OXYCODONE HCL 5 MG PO TABS: 5.0000 mg | ORAL_TABLET | ORAL | Status: DC | PRN

## 2016-10-27 MED ORDER — PHENYLEPHRINE HCL 10 MG/ML IJ SOLN
INTRAVENOUS | Status: DC | PRN
  Administered 2016-10-27: 25 ug/min via INTRAVENOUS

## 2016-10-27 MED ORDER — FENTANYL CITRATE (PF) 100 MCG/2ML IJ SOLN
INTRAMUSCULAR | Status: AC
  Administered 2016-10-27: 50 ug via INTRAVENOUS
  Filled 2016-10-27: qty 2

## 2016-10-27 SURGICAL SUPPLY — 41 items
BIT DRILL JUGRKNT W/NDL BIT2.9 (DRILL) IMPLANT
BLADE FULL RADIUS 3.5 (BLADE) ×3 IMPLANT
BUR ACROMIONIZER 4.0 (BURR) ×3 IMPLANT
CANNULA SHAVER 8MMX76MM (CANNULA) ×3 IMPLANT
CHLORAPREP W/TINT 26ML (MISCELLANEOUS) ×3 IMPLANT
COVER MAYO STAND STRL (DRAPES) ×3 IMPLANT
DRAPE IMP U-DRAPE 54X76 (DRAPES) ×6 IMPLANT
DRILL JUGGERKNOT W/NDL BIT 2.9 (DRILL)
DRSG OPSITE POSTOP 4X8 (GAUZE/BANDAGES/DRESSINGS) IMPLANT
ELECT REM PT RETURN 9FT ADLT (ELECTROSURGICAL) ×3
ELECTRODE REM PT RTRN 9FT ADLT (ELECTROSURGICAL) ×2 IMPLANT
GAUZE PETRO XEROFOAM 1X8 (MISCELLANEOUS) ×3 IMPLANT
GAUZE SPONGE 4X4 12PLY STRL (GAUZE/BANDAGES/DRESSINGS) ×3 IMPLANT
GLOVE BIO SURGEON STRL SZ7.5 (GLOVE) ×6 IMPLANT
GLOVE BIO SURGEON STRL SZ8 (GLOVE) ×6 IMPLANT
GLOVE BIOGEL PI IND STRL 8 (GLOVE) ×2 IMPLANT
GLOVE BIOGEL PI INDICATOR 8 (GLOVE) ×1
GLOVE INDICATOR 8.0 STRL GRN (GLOVE) ×3 IMPLANT
GOWN STRL REUS W/ TWL LRG LVL3 (GOWN DISPOSABLE) ×2 IMPLANT
GOWN STRL REUS W/ TWL XL LVL3 (GOWN DISPOSABLE) ×2 IMPLANT
GOWN STRL REUS W/TWL LRG LVL3 (GOWN DISPOSABLE) ×1
GOWN STRL REUS W/TWL XL LVL3 (GOWN DISPOSABLE) ×1
GRASPER SUT 15 45D LOW PRO (SUTURE) IMPLANT
IV LACTATED RINGER IRRG 3000ML (IV SOLUTION) ×2
IV LR IRRIG 3000ML ARTHROMATIC (IV SOLUTION) ×4 IMPLANT
MANIFOLD NEPTUNE II (INSTRUMENTS) ×3 IMPLANT
MASK FACE SPIDER DISP (MASK) ×3 IMPLANT
MAT BLUE FLOOR 46X72 FLO (MISCELLANEOUS) ×3 IMPLANT
NEEDLE REVERSE CUT 1/2 CRC (NEEDLE) IMPLANT
PACK ARTHROSCOPY SHOULDER (MISCELLANEOUS) ×3 IMPLANT
SLING ARM LRG DEEP (SOFTGOODS) ×3 IMPLANT
SLING ULTRA II LG (MISCELLANEOUS) IMPLANT
STAPLER SKIN PROX 35W (STAPLE) ×3 IMPLANT
STRAP SAFETY BODY (MISCELLANEOUS) ×3 IMPLANT
SUT ETHIBOND 0 MO6 C/R (SUTURE) ×3 IMPLANT
SUT VIC AB 2-0 CT1 27 (SUTURE) ×2
SUT VIC AB 2-0 CT1 TAPERPNT 27 (SUTURE) ×4 IMPLANT
TAPE MICROFOAM 4IN (TAPE) ×3 IMPLANT
TUBING ARTHRO INFLOW-ONLY STRL (TUBING) ×3 IMPLANT
TUBING CONNECTING 10 (TUBING) ×3 IMPLANT
WAND HAND CNTRL MULTIVAC 90 (MISCELLANEOUS) ×3 IMPLANT

## 2016-10-27 NOTE — Anesthesia Procedure Notes (Signed)
Procedure Name: Intubation Date/Time: 10/27/2016 12:13 PM Performed by: Rosaria Ferries, Kameisha Malicki Pre-anesthesia Checklist: Patient identified, Emergency Drugs available, Suction available and Patient being monitored Patient Re-evaluated:Patient Re-evaluated prior to inductionOxygen Delivery Method: Circle system utilized Preoxygenation: Pre-oxygenation with 100% oxygen Intubation Type: IV induction Laryngoscope Size: Mac and 3 Grade View: Grade I Laser Tube: Cuffed inflated with minimal occlusive pressure - saline Tube size: 7.0 mm Number of attempts: 1 Placement Confirmation: ETT inserted through vocal cords under direct vision,  positive ETCO2 and breath sounds checked- equal and bilateral Secured at: 23 cm Tube secured with: Tape Dental Injury: Teeth and Oropharynx as per pre-operative assessment

## 2016-10-27 NOTE — Transfer of Care (Signed)
Immediate Anesthesia Transfer of Care Note  Patient: John [name]  Procedure(s) Performed: Procedure(s): SHOULDER ARTHROSCOPY WITH SUBACROMIAL DECOMPRESSION  AND DEBRIDEMENT (Right)  Patient Location: PACU  Anesthesia Type:General  Level of Consciousness: awake, alert , oriented and patient cooperative  Airway & Oxygen Therapy: Patient Spontanous Breathing and Patient connected to face mask oxygen  Post-op Assessment: Report given to RN and Post -op Vital signs reviewed and stable  Post vital signs: Reviewed and stable  Last Vitals:  Vitals:   10/27/16 1149 10/27/16 1327  BP: 120/81 134/72  Pulse: (!) 55 84  Resp: 12 15  Temp:  36.1 C    Last Pain:  Vitals:   10/27/16 0950  TempSrc: Oral  PainSc: 4          Complications: No apparent anesthesia complications

## 2016-10-27 NOTE — OR Nursing (Signed)
To PACU via stretcher bay 12 for block

## 2016-10-27 NOTE — Anesthesia Procedure Notes (Signed)
Anesthesia Regional Block:  Interscalene brachial plexus block  Pre-Anesthetic Checklist: ,, timeout performed, Correct Patient, Correct Site, Correct Laterality, Correct Procedure, Correct Position, site marked, Risks and benefits discussed,  Surgical consent,  Pre-op evaluation,  At surgeon's request and post-op pain management  Laterality: Right and Upper  Prep: chloraprep       Needles:  Injection technique: Single-shot  Needle Type: Stimiplex     Needle Length: 5cm 5 cm Needle Gauge: 22 and 22 G    Additional Needles:  Procedures: ultrasound guided (picture in chart) Interscalene brachial plexus block Narrative:  Start time: 10/27/2016 11:32 AM End time: 10/27/2016 11:37 AM Injection made incrementally with aspirations every 5 mL.  Performed by: Personally  Anesthesiologist: Lenard SimmerKARENZ, Etheridge Geil  Additional Notes: Functioning IV was confirmed and monitors were applied.  A 50mm 22ga Stimuplex needle was used. Sterile prep and drape,hand hygiene and sterile gloves were used.  Negative aspiration and negative test dose prior to incremental administration of local anesthetic. The patient tolerated the procedure well.

## 2016-10-27 NOTE — Discharge Instructions (Addendum)
Keep dressing dry and intact.  °May shower after dressing changed on post-op day #4 (Monday).  °Cover staples with Band-Aids after drying off. °Apply ice frequently to shoulder. °Take oxycodone as prescribed when needed.  °May supplement with ES Tylenol if necessary. °Keep shoulder immobilizer on at all times except may remove for bathing purposes. °Follow-up in 10-14 days or as scheduled. ° ° °AMBULATORY SURGERY  °DISCHARGE INSTRUCTIONS ° ° °1) The drugs that you were given will stay in your system until tomorrow so for the next 24 hours you should not: ° °A) Drive an automobile °B) Make any legal decisions °C) Drink any alcoholic beverage ° ° °2) You may resume regular meals tomorrow.  Today it is better to start with liquids and gradually work up to solid foods. ° °You may eat anything you prefer, but it is better to start with liquids, then soup and crackers, and gradually work up to solid foods. ° ° °3) Please notify your doctor immediately if you have any unusual bleeding, trouble breathing, redness and pain at the surgery site, drainage, fever, or pain not relieved by medication. ° ° ° °4) Additional Instructions: ° ° ° ° ° ° ° °Please contact your physician with any problems or Same Day Surgery at [phone], Monday through Friday 6 am to 4 pm, or [facility] Main number at [phone]. °

## 2016-10-27 NOTE — Anesthesia Preprocedure Evaluation (Signed)
Anesthesia Evaluation  Patient identified by MRN, date of birth, ID band Patient awake    Reviewed: Allergy & Precautions, H&P , NPO status , Patient's Chart, lab work & pertinent test results, reviewed documented beta blocker date and time   History of Anesthesia Complications Negative for: history of anesthetic complications  Airway Mallampati: I  TM Distance: >3 FB Neck ROM: full    Dental no notable dental hx. (+) Teeth Intact   Pulmonary neg shortness of breath, neg sleep apnea, neg COPD, Recent URI , Residual Cough,           Cardiovascular Exercise Tolerance: Good negative cardio ROS       Neuro/Psych negative neurological ROS  negative psych ROS   GI/Hepatic negative GI ROS, Neg liver ROS,   Endo/Other  negative endocrine ROS  Renal/GU CRFRenal disease (kidney stones)  negative genitourinary   Musculoskeletal   Abdominal   Peds  Hematology negative hematology ROS (+)   Anesthesia Other Findings Past Medical History: No date: History of kidney stones No date: Hyperlipidemia   Reproductive/Obstetrics negative OB ROS                             Anesthesia Physical Anesthesia Plan  ASA: II  Anesthesia Plan: General and Regional   Post-op Pain Management: GA combined w/ Regional for post-op pain   Induction:   Airway Management Planned:   Additional Equipment:   Intra-op Plan:   Post-operative Plan:   Informed Consent: I have reviewed the patients History and Physical, chart, labs and discussed the procedure including the risks, benefits and alternatives for the proposed anesthesia with the patient or authorized representative who has indicated his/her understanding and acceptance.   Dental Advisory Given  Plan Discussed with: Anesthesiologist, CRNA and Surgeon  Anesthesia Plan Comments:         Anesthesia Quick Evaluation

## 2016-10-27 NOTE — Anesthesia Post-op Follow-up Note (Cosign Needed)
Anesthesia QCDR form completed.        

## 2016-10-27 NOTE — H&P (Signed)
Paper H&P to be scanned into permanent record. H&P reviewed. No changes. 

## 2016-10-27 NOTE — Op Note (Signed)
10/27/2016  1:19 PM  Patient:   John [name]  Pre-Op Diagnosis:   Impingement/tendinopathy, right shoulder.  Postoperative diagnosis: Impingement/tendinopathy with labral fraying, right shoulder.  Procedure: Arthroscopic labral debridement and arthroscopic subacromial decompression, right shoulder.  Anesthesia: General endotracheal with interscalene block placed preoperatively by the anesthesiologist.  Surgeon:   Maryagnes AmosJ. Jeffrey Cylee Dattilo, MD  Assistant:   Ellwood DenseJarred Fisher, PA-S  Findings: As above. The labrum demonstrated fraying anteriorly and posterior superiorly, but there was no separation from the glenoid. The articular surfaces of the glenoid and humerus both were in excellent condition. The rotator cuff also was in excellent condition, as was the biceps tendon.  Complications: None  Fluids:   800 cc  Estimated blood loss: 5 cc  Tourniquet time: None  Drains: None  Closure: Staples   Brief clinical note: The patient is a 56 year old male with a history of right shoulder pain. The patient's symptoms have progressed despite medications, activity modification, etc. The patient's history and examination are consistent with impingement/tendinopathy with a possible partial thickness rotator cuff, as suggested by an MRI scan. The patient presents at this time for definitive management of his shoulder symptoms.  Procedure: The patient underwent placement of an interscalene block by the anesthesiologist in the preoperative holding area before he was brought into the operating room and lain in the supine position. The patient then underwent general endotracheal intubation and anesthesia before being repositioned in the beach chair position using the beach chair positioner. The right shoulder and upper extremity were prepped with ChloraPrep solution before being draped sterilely. Preoperative antibiotics were administered. A timeout was performed to confirm the proper  surgical site before the expected portal sites and incision site were injected with 0.5% Sensorcaine with epinephrine. A posterior portal was created and the glenohumeral joint thoroughly inspected with the findings as described above. An anterior portal was created using an outside-in technique. The labrum and rotator cuff were further probed, again confirming the above-noted findthe areas of labral fraying and localized synovitis were debrided using the full-radius resector. The ArthroCare wand was inserted and used to obtain hemostasis as well as to "anneal" the labrum superiorly and anteriorly. The instruments were removed from the joint after suctioning the excess fluid.  The camera was repositioned through the posterior portal into the subacromial space. A separate lateral portal was created using an outside-in technique. The 3.5 mm full-radius resector was introduced and used to perform a subtotal bursectomy. The ArthroCare wand was then inserted and used to remove the periosteal tissue off the undersurface of the anterior third of the acromion as well as to recess the coracoacromial ligament from its attachment along the anterior and lateral margins of the acromion. The 4.0 mm acromionizing bur was introduced and used to complete the decompression by removing the undersurface of the anterior third of the acromion. The full radius resector was reintroduced to remove any residual bony debris before the ArthroCare wand was reintroduced to obtain hemostasis. The instruments were then removed from the subacromial space after suctioning the excess fluid.  The portal sites were closed using staples. A sterile bulky dressing was applied to the shoulder before the arm was placed into a standard shoulder sling. The patient was then awakened, extubated, and returned to the recovery room in satisfactory condition after tolerating the procedure well.

## 2016-10-28 NOTE — Anesthesia Postprocedure Evaluation (Signed)
Anesthesia Post Note  Patient: John [name]  Procedure(s) Performed: Procedure(s) (LRB): SHOULDER ARTHROSCOPY WITH SUBACROMIAL DECOMPRESSION  AND DEBRIDEMENT (Right)  Patient location during evaluation: PACU Anesthesia Type: Regional and General Level of consciousness: awake and alert Pain management: pain level controlled Vital Signs Assessment: post-procedure vital signs reviewed and stable Respiratory status: spontaneous breathing, nonlabored ventilation, respiratory function stable and patient connected to nasal cannula oxygen Cardiovascular status: blood pressure returned to baseline and stable Postop Assessment: no signs of nausea or vomiting Anesthetic complications: no     Last Vitals:  Vitals:   10/27/16 1418 10/27/16 1436  BP:  121/69  Pulse: 86 83  Resp: 13 14  Temp:      Last Pain:  Vitals:   10/28/16 0843  TempSrc:   PainSc: 1                  John [name]

## 2016-11-08 ENCOUNTER — Ambulatory Visit
Admission: RE | Admit: 2016-11-08 | Discharge: 2016-11-08 | Disposition: A | Discharge status: Home / Self Care | Payer: BLUE CROSS/BLUE SHIELD | Source: Ambulatory Visit | Attending: Family Medicine | Admitting: Family Medicine

## 2016-11-08 ENCOUNTER — Other Ambulatory Visit: Payer: Self-pay | Admitting: Family Medicine

## 2016-11-08 DIAGNOSIS — R0600 Dyspnea, unspecified: Secondary | ICD-10-CM

## 2016-11-08 DIAGNOSIS — R05 Cough: Secondary | ICD-10-CM

## 2016-11-08 DIAGNOSIS — R0602 Shortness of breath: Secondary | ICD-10-CM | POA: Insufficient documentation

## 2016-11-08 DIAGNOSIS — R059 Cough, unspecified: Secondary | ICD-10-CM

## 2016-12-12 DIAGNOSIS — G5621 Lesion of ulnar nerve, right upper limb: Secondary | ICD-10-CM | POA: Insufficient documentation

## 2016-12-28 ENCOUNTER — Emergency Department
Admission: EM | Admit: 2016-12-28 | Discharge: 2016-12-28 | Disposition: A | Discharge status: Home / Self Care | Payer: BLUE CROSS/BLUE SHIELD | Attending: Emergency Medicine | Admitting: Emergency Medicine

## 2016-12-28 ENCOUNTER — Encounter: Payer: Self-pay | Admitting: Emergency Medicine

## 2016-12-28 ENCOUNTER — Emergency Department: Payer: BLUE CROSS/BLUE SHIELD

## 2016-12-28 DIAGNOSIS — F17290 Nicotine dependence, other tobacco product, uncomplicated: Secondary | ICD-10-CM | POA: Insufficient documentation

## 2016-12-28 DIAGNOSIS — Z79899 Other long term (current) drug therapy: Secondary | ICD-10-CM | POA: Insufficient documentation

## 2016-12-28 DIAGNOSIS — N200 Calculus of kidney: Secondary | ICD-10-CM | POA: Insufficient documentation

## 2016-12-28 DIAGNOSIS — R109 Unspecified abdominal pain: Secondary | ICD-10-CM | POA: Diagnosis present

## 2016-12-28 LAB — BASIC METABOLIC PANEL
Anion gap: 9 (ref 5–15)
BUN: 12 mg/dL (ref 6–20)
CALCIUM: 8.9 mg/dL (ref 8.9–10.3)
CO2: 23 mmol/L (ref 22–32)
Chloride: 105 mmol/L (ref 101–111)
Creatinine, Ser: 1.54 mg/dL — ABNORMAL HIGH (ref 0.61–1.24)
GFR calc Af Amer: 57 mL/min — ABNORMAL LOW (ref >60)
GFR calc non Af Amer: 49 mL/min — ABNORMAL LOW (ref >60)
Glucose, Bld: 175 mg/dL — ABNORMAL HIGH (ref 65–99)
Potassium: 3.7 mmol/L (ref 3.5–5.1)
SODIUM: 137 mmol/L (ref 135–145)

## 2016-12-28 LAB — URINALYSIS, COMPLETE (UACMP) WITH MICROSCOPIC
BILIRUBIN URINE: NEGATIVE
Bacteria, UA: NONE SEEN
GLUCOSE, UA: NEGATIVE mg/dL
KETONES UR: NEGATIVE mg/dL
LEUKOCYTES UA: NEGATIVE
NITRITE: NEGATIVE
PH: 6 (ref 5.0–8.0)
Protein, ur: NEGATIVE mg/dL
Specific Gravity, Urine: 1.014 (ref 1.005–1.030)
Squamous Epithelial / LPF: NONE SEEN

## 2016-12-28 LAB — CBC
HCT: 50.1 % (ref 40.0–52.0)
HEMOGLOBIN: 17.2 g/dL (ref 13.0–18.0)
MCH: 29.6 pg (ref 26.0–34.0)
MCHC: 34.4 g/dL (ref 32.0–36.0)
MCV: 86 fL (ref 80.0–100.0)
PLATELETS: 292 10*3/uL (ref 150–440)
RBC: 5.82 MIL/uL (ref 4.40–5.90)
RDW: 14.3 % (ref 11.5–14.5)
WBC: 16.6 10*3/uL — ABNORMAL HIGH (ref 3.8–10.6)

## 2016-12-28 MED ORDER — HYDROMORPHONE HCL 2 MG PO TABS: 2.0000 mg | ORAL_TABLET | Freq: Two times a day (BID) | ORAL | 0 refills | Status: DC | PRN

## 2016-12-28 MED ORDER — HYDROMORPHONE HCL 1 MG/ML IJ SOLN
INTRAMUSCULAR | Status: AC
  Administered 2016-12-28: 1 mg via INTRAVENOUS
  Filled 2016-12-28: qty 1

## 2016-12-28 MED ORDER — HYDROMORPHONE HCL 1 MG/ML IJ SOLN
INTRAMUSCULAR | Status: AC
  Filled 2016-12-28: qty 1

## 2016-12-28 MED ORDER — HYDROMORPHONE HCL 1 MG/ML IJ SOLN
1.0000 mg | Freq: Once | INTRAMUSCULAR | Status: AC
  Administered 2016-12-28: 1 mg via INTRAVENOUS

## 2016-12-28 MED ORDER — TAMSULOSIN HCL 0.4 MG PO CAPS: 0.4000 mg | ORAL_CAPSULE | Freq: Every day | ORAL | 0 refills | Status: DC

## 2016-12-28 MED ORDER — MORPHINE SULFATE (PF) 4 MG/ML IV SOLN
4.0000 mg | Freq: Once | INTRAVENOUS | Status: AC
  Administered 2016-12-28: 4 mg via INTRAVENOUS

## 2016-12-28 MED ORDER — ONDANSETRON 4 MG PO TBDP: 4.0000 mg | ORAL_TABLET | Freq: Three times a day (TID) | ORAL | 0 refills | Status: DC | PRN

## 2016-12-28 MED ORDER — ONDANSETRON HCL 4 MG/2ML IJ SOLN
INTRAMUSCULAR | Status: AC
  Administered 2016-12-28: 4 mg via INTRAVENOUS
  Filled 2016-12-28: qty 2

## 2016-12-28 MED ORDER — ONDANSETRON HCL 4 MG/2ML IJ SOLN
4.0000 mg | Freq: Once | INTRAMUSCULAR | Status: AC
  Administered 2016-12-28: 4 mg via INTRAVENOUS

## 2016-12-28 MED ORDER — SODIUM CHLORIDE 0.9 % IV BOLUS (SEPSIS)
1000.0000 mL | Freq: Once | INTRAVENOUS | Status: AC
  Administered 2016-12-28: 1000 mL via INTRAVENOUS

## 2016-12-28 MED ORDER — MORPHINE SULFATE (PF) 4 MG/ML IV SOLN
INTRAVENOUS | Status: AC
  Administered 2016-12-28: 4 mg via INTRAVENOUS
  Filled 2016-12-28: qty 1

## 2016-12-28 NOTE — ED Provider Notes (Signed)
[name] Emergency Department Provider Note    First MD Initiated Contact with Patient 12/28/16 0231     (approximate)  I have reviewed the triage vital signs and the nursing notes.   HISTORY  Chief Complaint Flank Pain   HPI John [name] is a 56 y.o. male with [place] of chronic medical conditions including multiple kidney stones presents to the emergency department with 10 out of 10 right flank pain with associated nausea and vomiting. Patient states consistent pain consistent with previous kidney stone episodes. Patient denies any fever afebrile on presentation.   Past Medical History:  Diagnosis Date  . History of kidney stones   . Hyperlipidemia     There are no active problems to display for this patient.   Past Surgical History:  Procedure Laterality Date  . CHOLECYSTECTOMY    . SHOULDER ARTHROSCOPY WITH SUBACROMIAL DECOMPRESSION Right 10/27/2016   Procedure: SHOULDER ARTHROSCOPY WITH SUBACROMIAL DECOMPRESSION  AND DEBRIDEMENT;  Surgeon: Christena Flake, MD;  Location: [facility] ORS;  Service: Orthopedics;  Laterality: Right;  . SHOULDER SURGERY Right 2003    Prior to Admission medications   Medication Sig Start Date End Date Taking? Authorizing Provider  atorvastatin (LIPITOR) 10 MG tablet Take 10 mg by mouth daily.    Historical Provider, MD  oxyCODONE (ROXICODONE) 5 MG immediate release tablet Take 1-2 tablets (5-10 mg total) by mouth every 4 (four) hours as needed for severe pain. 10/27/16   Christena Flake, MD    Allergies No known drug allergies  Family History  Problem Relation Age of Onset  . Heart disease Father     Social History Social History  Substance Use Topics  . Smoking status: Never Smoker  . Smokeless tobacco: Current User    Types: Snuff  . Alcohol use No    Review of Systems Constitutional: No fever/chills Eyes: No visual changes. ENT: No sore throat. Cardiovascular: Denies chest pain. Respiratory: Denies  shortness of breath. Gastrointestinal:Positive for right flank pain No nausea, no vomiting.  No diarrhea.  No constipation. Genitourinary: Negative for dysuria. Musculoskeletal: Negative for back pain. Skin: Negative for rash. Neurological: Negative for headaches, focal weakness or numbness.  10-point ROS otherwise negative.  ____________________________________________   PHYSICAL EXAM:  VITAL SIGNS: ED Triage Vitals [12/28/16 0221]  Enc Vitals Group     BP (!) 184/112     Pulse Rate 91     Resp (!) 24     Temp      Temp Source Oral     SpO2 98 %     Weight 200 lb (90.7 kg)     Height 6' (1.829 m)     Head Circumference      Peak Flow      Pain Score 8     Pain Loc      Pain Edu?      Excl. in GC?     Constitutional: Alert and oriented. Apparent discomfort Eyes: Conjunctivae are normal. PERRL. EOMI. Head: Atraumatic. Mouth/Throat: Mucous membranes are moist.  Oropharynx non-erythematous. Neck: No stridor.   Cardiovascular: Normal rate, regular rhythm. Good peripheral circulation. Grossly normal heart sounds. Respiratory: Normal respiratory effort.  No retractions. Lungs CTAB. Gastrointestinal: Soft and nontender. No distention.  Musculoskeletal: No lower extremity tenderness nor edema. No gross deformities of extremities. Neurologic:  Normal speech and language. No gross focal neurologic deficits are appreciated.  Skin:  Skin is warm, dry and intact. No rash noted. Psychiatric: Mood and affect are  normal. Speech and behavior are normal.  ____________________________________________   LABS (all labs ordered are listed, but only abnormal results are displayed)  Labs Reviewed  CBC - Abnormal; Notable for the following:       Result Value   WBC 16.6 (*)    All other components within normal limits  BASIC METABOLIC PANEL - Abnormal; Notable for the following:    Glucose, Bld 175 (*)    Creatinine, Ser 1.54 (*)    GFR calc non Af Amer 49 (*)    GFR calc Af Amer  57 (*)    All other components within normal limits  URINALYSIS, COMPLETE (UACMP) WITH MICROSCOPIC   __  RADIOLOGY I, Dilkon N Ganon Demasi, personally viewed and evaluated these images (plain radiographs) as part of my medical decision making, as well as reviewing the written report by the radiologist.  Ct Renal Stone Study  Result Date: 12/28/2016 CLINICAL DATA:  Right flank pain.  History kidney stone. EXAM: CT ABDOMEN AND PELVIS WITHOUT CONTRAST TECHNIQUE: Multidetector CT imaging of the abdomen and pelvis was performed following the standard protocol without IV contrast. COMPARISON:  Most recent comparison 12/24/2015 FINDINGS: Lower chest: Left basilar scarring or atelectasis. No pleural fluid. Hepatobiliary: Decreased hepatic density consistent with steatosis. No evidence of focal lesion. Clips in the gallbladder fossa postcholecystectomy. No biliary dilatation. Pancreas: No ductal dilatation or inflammation. Spleen: Normal in size without focal abnormality. Adrenals/Urinary Tract: Obstructing 6 x 7 mm stone in the mid distal right ureter (at the level of S1) with a tiny adjacent stone. Moderate resultant hydroureteronephrosis and perinephric edema. Ureter distal to this is decompressed. The stones are at the same location as prior obstructing ureteral calculi. There are at least 5 additional nonobstructing stones in the right kidney. At least 10 nonobstructing stones are seen in the left kidney. No left hydronephrosis. The left ureter is decompressed. There multiple bilateral renal cysts. Urinary bladder is minimally distended without stone. Normal adrenal glands. Stomach/Bowel: Minimal herniation of fat through the esophageal hiatus, no frank hiatal hernia. Multifocal colonic diverticulosis throughout the from the transverse distally, advanced in the distal descending and sigmoid. No acute diverticulitis. Normal appendix. Vascular/Lymphatic: No significant vascular findings are present. No enlarged  abdominal or pelvic lymph nodes. Reproductive: Prominent sized prostate gland. Other: Fat containing left inguinal hernia. No free air free fluid. Tiny fat containing umbilical hernia. Musculoskeletal: There are no acute or suspicious osseous abnormalities. IMPRESSION: 1. Obstructing 6 x 7 mm stone in the mid distal right ureter with moderate hydronephrosis. Tiny adjacent ureteral stone. 2. Nonobstructing stones within both kidneys. Bilateral renal cysts. 3. Colonic diverticulosis, advanced in the distal descending and sigmoid colon, no acute diverticulitis. 4. Hepatic steatosis. Electronically Signed   By: Rubye Oaks M.D.   On: 12/28/2016 03:20        Procedures   ____________________________________________   INITIAL IMPRESSION / ASSESSMENT AND PLAN / ED COURSE  Pertinent labs & imaging results that were available during my care of the patient were reviewed by me and considered in my medical decision making (see chart for details).   Patient received multiple doses of IV morphine and subsequently Dilaudid for pain control. In addition patient given IV Zofran for antiemetic. CT scan revealed a 6 x 7 distal right ureter stone. Awaiting pain control patient will be referred to Dr. Shane Crutch off     ____________________________________________  FINAL CLINICAL IMPRESSION(S) / ED DIAGNOSES  Final diagnoses:  Kidney stone     MEDICATIONS GIVEN DURING THIS  VISIT:  Medications  sodium chloride 0.9 % bolus 1,000 mL (1,000 mLs Intravenous New Bag/Given 12/28/16 0245)  morphine 4 MG/ML injection 4 mg (4 mg Intravenous Given 12/28/16 0245)  ondansetron (ZOFRAN) injection 4 mg (4 mg Intravenous Given 12/28/16 0245)  HYDROmorphone (DILAUDID) injection 1 mg (1 mg Intravenous Given 12/28/16 0333)     NEW OUTPATIENT MEDICATIONS STARTED DURING THIS VISIT:  New Prescriptions   No medications on file    Modified Medications   No medications on file    Discontinued Medications   No  medications on file     Note:  This document was prepared using Dragon voice recognition software and may include unintentional dictation errors.    Darci Currentandolph N India Jolin, MD 12/28/16 [id number]

## 2016-12-28 NOTE — ED Notes (Signed)
MD notified of patient's BP, per MD states okay for D/C.

## 2016-12-28 NOTE — ED Triage Notes (Addendum)
Pt to room 8 via w/c, appears uncomfortable, grimacing; pt reports right flank pain radiating into abd since yesterday; st hx kidney stones; took percocet at midnight without relief

## 2016-12-28 NOTE — ED Notes (Signed)
This RN to bedside, pt removed from O2, explained to patient would be d/c after monitoring O2 saturation. Pt requesting something to drink. Will continue to monitor for further patient needs.

## 2016-12-28 NOTE — ED Notes (Signed)
Pt given meal tray, pt noted to be ambulating around the room with no difficulty at this time. Will continue to monitor for further patient needs.

## 2016-12-28 NOTE — ED Notes (Signed)
NAD noted at time of D/C. Pt refused wheelchair to the lobby at this time. PT ambulatory to the lobby with his wife at this time. Pt/SO deny any comments/concerns at this time.

## 2016-12-28 NOTE — ED Notes (Signed)
Pt desat to 83% on RA, this RN to bedside to introduce self to patient and SO, pt awoken by this RN's arrival, O2 saturation 97% on RA. Pt states he feels sleepy, this RN placed patient on 2L of O2 via [place]. MD notified of patient desat. Pt c/o being hungry, explained to patient would need to verify with MD if patient could eat, pt states understanding. Will continue to monitor for further patient needs.

## 2017-10-06 ENCOUNTER — Observation Stay
Admission: EM | Admit: 2017-10-06 | Discharge: 2017-10-07 | Disposition: A | Discharge status: Home / Self Care | Payer: BLUE CROSS/BLUE SHIELD | Attending: Internal Medicine | Admitting: Internal Medicine

## 2017-10-06 DIAGNOSIS — R52 Pain, unspecified: Secondary | ICD-10-CM

## 2017-10-06 DIAGNOSIS — Z87442 Personal history of urinary calculi: Secondary | ICD-10-CM | POA: Diagnosis not present

## 2017-10-06 DIAGNOSIS — N200 Calculus of kidney: Secondary | ICD-10-CM

## 2017-10-06 DIAGNOSIS — Z8249 Family history of ischemic heart disease and other diseases of the circulatory system: Secondary | ICD-10-CM | POA: Diagnosis not present

## 2017-10-06 DIAGNOSIS — N132 Hydronephrosis with renal and ureteral calculous obstruction: Secondary | ICD-10-CM | POA: Diagnosis not present

## 2017-10-06 DIAGNOSIS — N23 Unspecified renal colic: Secondary | ICD-10-CM

## 2017-10-06 DIAGNOSIS — R109 Unspecified abdominal pain: Secondary | ICD-10-CM | POA: Diagnosis present

## 2017-10-06 DIAGNOSIS — E785 Hyperlipidemia, unspecified: Secondary | ICD-10-CM | POA: Diagnosis not present

## 2017-10-06 DIAGNOSIS — N183 Chronic kidney disease, stage 3 (moderate): Secondary | ICD-10-CM | POA: Insufficient documentation

## 2017-10-06 DIAGNOSIS — Z720 Tobacco use: Secondary | ICD-10-CM | POA: Insufficient documentation

## 2017-10-06 DIAGNOSIS — Z9049 Acquired absence of other specified parts of digestive tract: Secondary | ICD-10-CM | POA: Insufficient documentation

## 2017-10-06 MED ORDER — HYDROMORPHONE HCL 1 MG/ML IJ SOLN
INTRAMUSCULAR | Status: AC
  Filled 2017-10-06: qty 1

## 2017-10-06 MED ORDER — HYDROMORPHONE HCL 1 MG/ML IJ SOLN
1.0000 mg | Freq: Once | INTRAMUSCULAR | Status: AC
  Administered 2017-10-06: 1 mg via INTRAVENOUS

## 2017-10-06 MED ORDER — SODIUM CHLORIDE 0.9 % IV BOLUS (SEPSIS)
1000.0000 mL | Freq: Once | INTRAVENOUS | Status: AC
  Administered 2017-10-07: 1000 mL via INTRAVENOUS

## 2017-10-06 MED ORDER — SODIUM CHLORIDE 0.9 % IV BOLUS (SEPSIS)
1000.0000 mL | Freq: Once | INTRAVENOUS | Status: AC
  Administered 2017-10-06: 1000 mL via INTRAVENOUS

## 2017-10-06 MED ORDER — HYDROMORPHONE HCL 1 MG/ML IJ SOLN
1.0000 mg | Freq: Once | INTRAMUSCULAR | Status: AC
  Administered 2017-10-06: 1 mg via INTRAVENOUS
  Filled 2017-10-06: qty 1

## 2017-10-06 MED ORDER — FENTANYL CITRATE (PF) 100 MCG/2ML IJ SOLN
100.0000 ug | Freq: Once | INTRAMUSCULAR | Status: AC
  Administered 2017-10-06: 100 ug via INTRAVENOUS
  Filled 2017-10-06: qty 2

## 2017-10-06 MED ORDER — ONDANSETRON HCL 4 MG/2ML IJ SOLN
4.0000 mg | Freq: Once | INTRAMUSCULAR | Status: AC
Start: 2017-10-06 — End: 2017-10-06
  Administered 2017-10-06: 4 mg via INTRAVENOUS
  Filled 2017-10-06: qty 2

## 2017-10-06 NOTE — ED Notes (Signed)
Spoke with Dr. Roxan Hockeyobinson regarding patient's pain level and probable kidney stones.  VO given for NS bolus 1 L and IV 100mcg fentanyl.  Read back and verified.

## 2017-10-06 NOTE — ED Triage Notes (Signed)
Pt states he took percocet at 5pm, with no relief.  Pt also took flexeril at 7pm, with no relief.

## 2017-10-06 NOTE — ED Triage Notes (Signed)
Pt has history of kidney stones, presents with L flank pain at this time.  Pt is moaning and groaning in triage.

## 2017-10-06 NOTE — ED Provider Notes (Signed)
[facility] Emergency Department Provider Note   ____________________________________________   First MD Initiated Contact with Patient 10/06/17 2341     (approximate)  I have reviewed the triage vital signs and the nursing notes.   HISTORY  Chief Complaint Flank Pain    HPI John [name] is a 57 y.o. male who presents to the ED from home with a chief complaint of left flank pain.  Patient has a history of recurrent kidney stones requiring stents and lithotripsy.  States he frequently passes stones and is able to manage it at home with Percocet and Flexeril.  He has been having left flank pain for the past 1.5 days.  Pain is unrelieved with the above medications.  Symptoms associated with nausea.  Denies associated fever, chills, chest pain, shortness of breath, abdominal pain, vomiting, hematuria.  Denies recent travel or trauma.  Nothing makes his symptoms better or worse.   Past Medical History:  Diagnosis Date  . History of kidney stones   . Hyperlipidemia     There are no active problems to display for this patient.   Past Surgical History:  Procedure Laterality Date  . CHOLECYSTECTOMY    . SHOULDER ARTHROSCOPY WITH SUBACROMIAL DECOMPRESSION Right 10/27/2016   Procedure: SHOULDER ARTHROSCOPY WITH SUBACROMIAL DECOMPRESSION  AND DEBRIDEMENT;  Surgeon: Christena Flake, MD;  Location: [facility] ORS;  Service: Orthopedics;  Laterality: Right;  . SHOULDER SURGERY Right 2003    Prior to Admission medications   Medication Sig Start Date End Date Taking? Authorizing Provider  atorvastatin (LIPITOR) 10 MG tablet Take 10 mg by mouth daily.    [provider]  HYDROmorphone (DILAUDID) 2 MG tablet Take 1 tablet (2 mg total) by mouth every 12 (twelve) hours as needed for severe pain. 12/28/16 12/28/17  Darci Current, MD  ondansetron (ZOFRAN ODT) 4 MG disintegrating tablet Take 1 tablet (4 mg total) by mouth every 8 (eight) hours as needed for nausea or  vomiting. 12/28/16   Darci Current, MD  oxyCODONE (ROXICODONE) 5 MG immediate release tablet Take 1-2 tablets (5-10 mg total) by mouth every 4 (four) hours as needed for severe pain. 10/27/16   Poggi, Excell Seltzer, MD  tamsulosin (FLOMAX) 0.4 MG CAPS capsule Take 1 capsule (0.4 mg total) by mouth daily after breakfast. 12/28/16   Darci Current, MD    Allergies Patient has no known allergies.  Family History  Problem Relation Age of Onset  . Heart disease Father     Social History Social History   Tobacco Use  . Smoking status: Never Smoker  . Smokeless tobacco: Current User    Types: Snuff  Substance Use Topics  . Alcohol use: No  . Drug use: No    Review of Systems  Constitutional: No fever/chills. Eyes: No visual changes. ENT: No sore throat. Cardiovascular: Denies chest pain. Respiratory: Denies shortness of breath. Gastrointestinal: Positive for left flank pain.  No abdominal pain.  Positive for nausea, no vomiting.  No diarrhea.  No constipation. Genitourinary: Negative for dysuria. Musculoskeletal: Negative for back pain. Skin: Negative for rash. Neurological: Negative for headaches, focal weakness or numbness.   ____________________________________________   PHYSICAL EXAM:  VITAL SIGNS: ED Triage Vitals  Enc Vitals Group     BP 10/06/17 2259 (!) 161/86     Pulse Rate 10/06/17 2259 81     Resp 10/06/17 2259 (!) 26     Temp 10/06/17 2259 (!) 97.5 F (36.4 C)  Temp Source 10/06/17 2259 Oral     SpO2 10/06/17 2259 98 %     Weight 10/06/17 2259 200 lb (90.7 kg)     Height --      Head Circumference --      Peak Flow --      Pain Score 10/06/17 2258 10     Pain Loc --      Pain Edu? --      Excl. in GC? --     Constitutional: Alert and oriented. Well appearing and in moderate acute distress. Eyes: Conjunctivae are normal. PERRL. EOMI. Head: Atraumatic. Nose: No congestion/rhinnorhea. Mouth/Throat: Mucous membranes are moist.  Oropharynx  non-erythematous. Neck: No stridor.   Cardiovascular: Normal rate, regular rhythm. Grossly normal heart sounds.  Good peripheral circulation. Respiratory: Normal respiratory effort.  No retractions. Lungs CTAB. Gastrointestinal: Soft and nontender to light or deep palpation. No distention. No abdominal bruits.  Mild left CVA tenderness. Musculoskeletal: No lower extremity tenderness nor edema.  No joint effusions. Neurologic:  Normal speech and language. No gross focal neurologic deficits are appreciated. No gait instability. Skin:  Skin is warm, dry and intact. No rash noted. Psychiatric: Mood and affect are normal. Speech and behavior are normal.  ____________________________________________   LABS (all labs ordered are listed, but only abnormal results are displayed)  Labs Reviewed  URINALYSIS, COMPLETE (UACMP) WITH MICROSCOPIC - Abnormal; Notable for the following components:      Result Value   Color, Urine YELLOW (*)    APPearance CLEAR (*)    Hgb urine dipstick MODERATE (*)    Leukocytes, UA TRACE (*)    All other components within normal limits  CBC WITH DIFFERENTIAL/PLATELET - Abnormal; Notable for the following components:   WBC 13.2 (*)    Neutro Abs 10.2 (*)    All other components within normal limits  BASIC METABOLIC PANEL - Abnormal; Notable for the following components:   Glucose, Bld 114 (*)    Creatinine, Ser 1.57 (*)    Calcium 8.1 (*)    GFR calc non Af Amer 48 (*)    GFR calc Af Amer 55 (*)    All other components within normal limits  URINE CULTURE   ____________________________________________  EKG  None ____________________________________________  RADIOLOGY  Ct Renal Stone Study  Result Date: 10/07/2017 CLINICAL DATA:  Left flank pain radiating to the left groin today. Previous history of kidney stones. Prior cholecystectomy. EXAM: CT ABDOMEN AND PELVIS WITHOUT CONTRAST TECHNIQUE: Multidetector CT imaging of the abdomen and pelvis was performed  following the standard protocol without IV contrast. COMPARISON:  12/28/2016 FINDINGS: Lower chest: Atelectasis in the lung bases. Hepatobiliary: Mild diffuse fatty infiltration of the liver with somewhat geographic pattern. No definite focal liver lesions. Appearance is similar to previous study. Surgical absence of the gallbladder. No bile duct dilatation. Pancreas: Unremarkable. No pancreatic ductal dilatation or surrounding inflammatory changes. Spleen: Normal in size without focal abnormality. Adrenals/Urinary Tract: No adrenal gland nodules. Bilateral renal cysts. Bilateral intrarenal stones, more numerous on the left. Largest stone is on the left and measures about 4 mm in diameter. There is a 6 mm stone in the distal left ureter just above the ureterovesical junction. Left hydronephrosis and hydroureter with stranding around the left kidney and ureter. No ureteral stone or obstruction on the right. Bladder wall is not thickened. No bladder stones. Stomach/Bowel: Diverticulosis of the colon. No evidence of diverticulitis. Stomach, small bowel, and colon are not abnormally distended. Appendix is normal. Vascular/Lymphatic: No  significant vascular findings are present. No enlarged abdominal or pelvic lymph nodes. Reproductive: Prostate gland is enlarged, measuring 5.1 cm diameter. Other: Small left inguinal hernia containing fat. No free air or free fluid in the abdomen. Musculoskeletal: Mild degenerative changes in the spine. No destructive bone lesions. IMPRESSION: 1. 6 mm stone in the distal left ureter with moderate proximal obstruction. 2. Multiple bilateral nonobstructing intrarenal stones. 3. Diverticulosis of the colon.  No evidence of diverticulitis. 4. Diffuse fatty infiltration of the liver. 5. Enlarged prostate gland. 6. Small left inguinal hernia containing fat. Electronically Signed   By: Burman NievesWilliam  Stevens M.D.   On: 10/07/2017 00:34     ____________________________________________   PROCEDURES  Procedure(s) performed: None  Procedures  Critical Care performed: No  ____________________________________________   INITIAL IMPRESSION / ASSESSMENT AND PLAN / ED COURSE  As part of my medical decision making, I reviewed the following data within the electronic MEDICAL RECORD NUMBER History obtained from family, Nursing notes reviewed and incorporated, Labs reviewed, Old chart reviewed, Radiograph reviewed  and Notes from prior ED visits.   57 year old male with recurrent kidney stones who presents with left flank pain. Differential diagnosis includes, but is not limited to, acute appendicitis, renal colic, testicular torsion, urinary tract infection/pyelonephritis, prostatitis,  epididymitis, diverticulitis, small bowel obstruction or ileus, colitis, abdominal aortic aneurysm, gastroenteritis, hernia, etc.  Patient evaluated after receiving fentanyl and Dilaudid.  States usually pain is resolved after 1 dose of Dilaudid.  Wife is concerned that patient is having a large, obstructive kidney stone.  Reviewed old records; patient's last CT scan was in 12/2016 which demonstrated over 10 nonobstructive stones in his left kidney.  We discussed risk/benefits of CT radiation; they wish to proceed with CT scan.  Additional 1 mg IV Dilaudid administered.  Clinical Course as of Oct 07 234  Sat Oct 07, 2017  0127 Updated patient and spouse of CT results. Pain returning; will re-dose Dilaudid. Urine sent. Will discuss with urology.  [JS]  [id number] Spoke with Dr. Apolinar JunesBrandon from urology; no urgent intervention at this time.  Recommends holding off on antibiotics and sending a urine culture.  Patient will be admitted to the hospitalist service for pain control and urology will consult in the morning.  Okay to give single dose of Toradol.  Discussed with hospitalist who will evaluate patient in the emergency department for admission.  [JS]     Clinical Course User Index [JS] Irean HongSung, Virtie Bungert J, MD     ____________________________________________   FINAL CLINICAL IMPRESSION(S) / ED DIAGNOSES  Final diagnoses:  Left flank pain  Ureteral colic  Intractable pain     ED Discharge Orders    None       Note:  This document was prepared using Dragon voice recognition software and may include unintentional dictation errors.    Irean HongSung, Romualdo Prosise J, MD 10/07/17 [id number]

## 2017-10-07 ENCOUNTER — Encounter: Payer: Self-pay | Admitting: Internal Medicine

## 2017-10-07 ENCOUNTER — Emergency Department: Payer: BLUE CROSS/BLUE SHIELD

## 2017-10-07 ENCOUNTER — Other Ambulatory Visit: Payer: Self-pay

## 2017-10-07 DIAGNOSIS — R52 Pain, unspecified: Secondary | ICD-10-CM

## 2017-10-07 DIAGNOSIS — N23 Unspecified renal colic: Secondary | ICD-10-CM

## 2017-10-07 DIAGNOSIS — R109 Unspecified abdominal pain: Secondary | ICD-10-CM

## 2017-10-07 DIAGNOSIS — N200 Calculus of kidney: Secondary | ICD-10-CM

## 2017-10-07 LAB — URINALYSIS, COMPLETE (UACMP) WITH MICROSCOPIC
Bacteria, UA: NONE SEEN
Bilirubin Urine: NEGATIVE
GLUCOSE, UA: NEGATIVE mg/dL
Ketones, ur: NEGATIVE mg/dL
Nitrite: NEGATIVE
PH: 5 (ref 5.0–8.0)
Protein, ur: NEGATIVE mg/dL
SQUAMOUS EPITHELIAL / LPF: NONE SEEN
Specific Gravity, Urine: 1.016 (ref 1.005–1.030)

## 2017-10-07 LAB — CBC WITH DIFFERENTIAL/PLATELET
BASOS ABS: 0.1 10*3/uL (ref 0–0.1)
Basophils Relative: 1 %
Eosinophils Absolute: 0.2 10*3/uL (ref 0–0.7)
Eosinophils Relative: 2 %
HEMATOCRIT: 46.3 % (ref 40.0–52.0)
HEMOGLOBIN: 15.3 g/dL (ref 13.0–18.0)
LYMPHS PCT: 14 %
Lymphs Abs: 1.8 10*3/uL (ref 1.0–3.6)
MCH: 29.4 pg (ref 26.0–34.0)
MCHC: 33.1 g/dL (ref 32.0–36.0)
MCV: 89 fL (ref 80.0–100.0)
MONOS PCT: 7 %
Monocytes Absolute: 0.9 10*3/uL (ref 0.2–1.0)
NEUTROS ABS: 10.2 10*3/uL — AB (ref 1.4–6.5)
NEUTROS PCT: 76 %
Platelets: 227 10*3/uL (ref 150–440)
RBC: 5.2 MIL/uL (ref 4.40–5.90)
RDW: 14.3 % (ref 11.5–14.5)
WBC: 13.2 10*3/uL — ABNORMAL HIGH (ref 3.8–10.6)

## 2017-10-07 LAB — BASIC METABOLIC PANEL
ANION GAP: 10 (ref 5–15)
Anion gap: 7 (ref 5–15)
BUN: 16 mg/dL (ref 6–20)
BUN: 17 mg/dL (ref 6–20)
CHLORIDE: 107 mmol/L (ref 101–111)
CO2: 22 mmol/L (ref 22–32)
CO2: 26 mmol/L (ref 22–32)
Calcium: 8 mg/dL — ABNORMAL LOW (ref 8.9–10.3)
Calcium: 8.1 mg/dL — ABNORMAL LOW (ref 8.9–10.3)
Chloride: 106 mmol/L (ref 101–111)
Creatinine, Ser: 1.57 mg/dL — ABNORMAL HIGH (ref 0.61–1.24)
Creatinine, Ser: 1.75 mg/dL — ABNORMAL HIGH (ref 0.61–1.24)
GFR calc Af Amer: 48 mL/min — ABNORMAL LOW (ref >60)
GFR calc non Af Amer: 42 mL/min — ABNORMAL LOW (ref >60)
GFR calc non Af Amer: 48 mL/min — ABNORMAL LOW (ref >60)
GFR, EST AFRICAN AMERICAN: 55 mL/min — AB (ref >60)
GLUCOSE: 112 mg/dL — AB (ref 65–99)
GLUCOSE: 114 mg/dL — AB (ref 65–99)
POTASSIUM: 4.4 mmol/L (ref 3.5–5.1)
Potassium: 3.8 mmol/L (ref 3.5–5.1)
Sodium: 139 mmol/L (ref 135–145)
Sodium: 139 mmol/L (ref 135–145)

## 2017-10-07 LAB — CBC
HEMATOCRIT: 46.7 % (ref 40.0–52.0)
Hemoglobin: 15.4 g/dL (ref 13.0–18.0)
MCH: 29.9 pg (ref 26.0–34.0)
MCHC: 32.9 g/dL (ref 32.0–36.0)
MCV: 90.7 fL (ref 80.0–100.0)
Platelets: 220 10*3/uL (ref 150–440)
RBC: 5.15 MIL/uL (ref 4.40–5.90)
RDW: 14.3 % (ref 11.5–14.5)
WBC: 12.2 10*3/uL — ABNORMAL HIGH (ref 3.8–10.6)

## 2017-10-07 MED ORDER — HYDROMORPHONE HCL 1 MG/ML IJ SOLN
1.0000 mg | INTRAMUSCULAR | Status: DC | PRN
  Administered 2017-10-07: 1 mg via INTRAVENOUS
  Filled 2017-10-07: qty 1

## 2017-10-07 MED ORDER — DEXTROSE-NACL 5-0.9 % IV SOLN
INTRAVENOUS | Status: DC
  Administered 2017-10-07: 05:00:00 via INTRAVENOUS

## 2017-10-07 MED ORDER — ACETAMINOPHEN 650 MG RE SUPP: 650.0000 mg | Freq: Four times a day (QID) | RECTAL | Status: DC | PRN

## 2017-10-07 MED ORDER — ACETAMINOPHEN 325 MG PO TABS: 650.0000 mg | ORAL_TABLET | Freq: Four times a day (QID) | ORAL | Status: DC | PRN

## 2017-10-07 MED ORDER — HYDROMORPHONE HCL 1 MG/ML IJ SOLN
1.0000 mg | Freq: Once | INTRAMUSCULAR | Status: AC
  Administered 2017-10-07: 1 mg via INTRAVENOUS
  Filled 2017-10-07: qty 1

## 2017-10-07 MED ORDER — ONDANSETRON HCL 4 MG/2ML IJ SOLN: 4.0000 mg | Freq: Four times a day (QID) | INTRAMUSCULAR | Status: DC | PRN

## 2017-10-07 MED ORDER — TAMSULOSIN HCL 0.4 MG PO CAPS
0.4000 mg | ORAL_CAPSULE | Freq: Once | ORAL | Status: AC
  Administered 2017-10-07: 0.4 mg via ORAL
  Filled 2017-10-07: qty 1

## 2017-10-07 MED ORDER — KETOROLAC TROMETHAMINE 30 MG/ML IJ SOLN
30.0000 mg | Freq: Once | INTRAMUSCULAR | Status: AC
  Administered 2017-10-07: 30 mg via INTRAVENOUS
  Filled 2017-10-07: qty 1

## 2017-10-07 MED ORDER — ONDANSETRON HCL 4 MG/2ML IJ SOLN
4.0000 mg | Freq: Once | INTRAMUSCULAR | Status: AC
  Administered 2017-10-07: 4 mg via INTRAVENOUS
  Filled 2017-10-07: qty 2

## 2017-10-07 MED ORDER — ONDANSETRON HCL 4 MG PO TABS: 4.0000 mg | ORAL_TABLET | Freq: Four times a day (QID) | ORAL | Status: DC | PRN

## 2017-10-07 NOTE — H&P (Signed)
[facility] [place] at [facility]   PATIENT NAME: John [name]    MR#:  [id number]  DATE OF BIRTH:  [birth year]  DATE OF ADMISSION:  10/06/2017  PRIMARY CARE PHYSICIAN: Jaclyn Shaggy, MD   REQUESTING/REFERRING PHYSICIAN:   CHIEF COMPLAINT:   Chief Complaint  Patient presents with  . Flank Pain    HISTORY OF PRESENT ILLNESS: John [name]  is a 57 y.o. male with a known history of nephrolithiasis, hyperlipidemia presented to the emergency room with left flank pain.  The pain started 3 days ago but worse since yesterday afternoon pain is located in the left flank sharp in nature 10 out of 10 on a scale of 1-10.  Patient received a Dilaudid IV and fentanyl for pain in the emergency room and felt better.  CT kidney stone scan showed 6 mm left distal ureteral stone with moderate obstruction.  Case was discussed with urology by ER physician.  No complaints of any chest pain, shortness of breath.  No nausea and vomitin  PAST MEDICAL HISTORY:   Past Medical History:  Diagnosis Date  . History of kidney stones   . Hyperlipidemia     PAST SURGICAL HISTORY:  Past Surgical History:  Procedure Laterality Date  . CHOLECYSTECTOMY    . SHOULDER ARTHROSCOPY WITH SUBACROMIAL DECOMPRESSION Right 10/27/2016   Procedure: SHOULDER ARTHROSCOPY WITH SUBACROMIAL DECOMPRESSION  AND DEBRIDEMENT;  Surgeon: Christena Flake, MD;  Location: [facility] ORS;  Service: Orthopedics;  Laterality: Right;  . SHOULDER SURGERY Right 2003    SOCIAL HISTORY:  Social History   Tobacco Use  . Smoking status: Never Smoker  . Smokeless tobacco: Current User    Types: Snuff  Substance Use Topics  . Alcohol use: No    FAMILY HISTORY:  Family History  Problem Relation Age of Onset  . Heart disease Father     DRUG ALLERGIES: No Known Allergies  REVIEW OF SYSTEMS:   CONSTITUTIONAL: No fever, fatigue or weakness.  EYES: No blurred or double vision.  EARS, NOSE, AND THROAT: No tinnitus or ear pain.   RESPIRATORY: No cough, shortness of breath, wheezing or hemoptysis.  CARDIOVASCULAR: No chest pain, orthopnea, edema.  GASTROINTESTINAL: No nausea, vomiting, diarrhea Has left flank pain  GENITOURINARY: No dysuria, hematuria.  ENDOCRINE: No polyuria, nocturia,  HEMATOLOGY: No anemia, easy bruising or bleeding SKIN: No rash or lesion. MUSCULOSKELETAL: No joint pain or arthritis.   NEUROLOGIC: No tingling, numbness, weakness.  PSYCHIATRY: No anxiety or depression.   MEDICATIONS AT HOME:  Prior to Admission medications   Medication Sig Start Date End Date Taking? Authorizing Provider  atorvastatin (LIPITOR) 10 MG tablet Take 10 mg by mouth daily.   Yes [provider]  cyclobenzaprine (FLEXERIL) 10 MG tablet Take 10 mg by mouth daily as needed.   Yes [provider]  losartan (COZAAR) 25 MG tablet Take 1 tablet by mouth daily. 10/06/17  Yes [provider]  oxyCODONE (ROXICODONE) 5 MG immediate release tablet Take 1-2 tablets (5-10 mg total) by mouth every 4 (four) hours as needed for severe pain. 10/27/16  Yes Poggi, Excell Seltzer, MD  tamsulosin (FLOMAX) 0.4 MG CAPS capsule Take 1 capsule (0.4 mg total) by mouth daily after breakfast. 12/28/16  Yes Darci Current, MD  HYDROmorphone (DILAUDID) 2 MG tablet Take 1 tablet (2 mg total) by mouth every 12 (twelve) hours as needed for severe pain. Patient not taking: Reported on 10/07/2017 12/28/16 12/28/17  Darci Current, MD  ondansetron (  ZOFRAN ODT) 4 MG disintegrating tablet Take 1 tablet (4 mg total) by mouth every 8 (eight) hours as needed for nausea or vomiting. Patient not taking: Reported on 10/07/2017 12/28/16   Darci CurrentBrown,  N, MD      PHYSICAL EXAMINATION:   VITAL SIGNS: Blood pressure 128/67, pulse 81, temperature (!) 97.5 F (36.4 C), temperature source Oral, resp. rate 20, weight 90.7 kg (200 lb), SpO2 91 %.  GENERAL:  57 y.o.-year-old patient lying in the bed with no acute distress.  EYES: Pupils equal,  round, reactive to light and accommodation. No scleral icterus. Extraocular muscles intact.  HEENT: Head atraumatic, normocephalic. Oropharynx and nasopharynx clear.  NECK:  Supple, no jugular venous distention. No thyroid enlargement, no tenderness.  LUNGS: Normal breath sounds bilaterally, no wheezing, rales,rhonchi or crepitation. No use of accessory muscles of respiration.  CARDIOVASCULAR: S1, S2 normal. No murmurs, rubs, or gallops.  ABDOMEN: Soft, nontender, nondistended. Bowel sounds present. No organomegaly or mass.  Left CVA angle tenderness noted. EXTREMITIES: No pedal edema, cyanosis, or clubbing.  NEUROLOGIC: Cranial nerves II through XII are intact. Muscle strength 5/5 in all extremities. Sensation intact. Gait not checked.  PSYCHIATRIC: The patient is alert and oriented x 3.  SKIN: No obvious rash, lesion, or ulcer.   LABORATORY PANEL:   CBC Recent Labs  Lab 10/06/17 2351  WBC 13.2*  HGB 15.3  HCT 46.3  PLT 227  MCV 89.0  MCH 29.4  MCHC 33.1  RDW 14.3  LYMPHSABS 1.8  MONOABS 0.9  EOSABS 0.2  BASOSABS 0.1   ------------------------------------------------------------------------------------------------------------------  Chemistries  Recent Labs  Lab 10/06/17 2351  NA 139  K 3.8  CL 107  CO2 22  GLUCOSE 114*  BUN 17  CREATININE 1.57*  CALCIUM 8.1*   ------------------------------------------------------------------------------------------------------------------ CrCl cannot be calculated (Unknown ideal weight.). ------------------------------------------------------------------------------------------------------------------ No results for input(s): TSH, T4TOTAL, T3FREE, THYROIDAB in the last 72 hours.  Invalid input(s): FREET3   Coagulation profile No results for input(s): INR, PROTIME in the last 168 hours. ------------------------------------------------------------------------------------------------------------------- No results for input(s):  DDIMER in the last 72 hours. -------------------------------------------------------------------------------------------------------------------  Cardiac Enzymes No results for input(s): CKMB, TROPONINI, MYOGLOBIN in the last 168 hours.  Invalid input(s): CK ------------------------------------------------------------------------------------------------------------------ Invalid input(s): POCBNP  ---------------------------------------------------------------------------------------------------------------  Urinalysis    Component Value Date/Time   COLORURINE YELLOW (A) 10/07/2017 0153   APPEARANCEUR CLEAR (A) 10/07/2017 0153   APPEARANCEUR Hazy 02/10/2014 1835   LABSPEC 1.016 10/07/2017 0153   LABSPEC 1.017 02/10/2014 1835   PHURINE 5.0 10/07/2017 0153   GLUCOSEU NEGATIVE 10/07/2017 0153   GLUCOSEU Negative 02/10/2014 1835   HGBUR MODERATE (A) 10/07/2017 0153   BILIRUBINUR NEGATIVE 10/07/2017 0153   BILIRUBINUR Negative 02/10/2014 1835   KETONESUR NEGATIVE 10/07/2017 0153   PROTEINUR NEGATIVE 10/07/2017 0153   NITRITE NEGATIVE 10/07/2017 0153   LEUKOCYTESUR TRACE (A) 10/07/2017 0153   LEUKOCYTESUR Negative 02/10/2014 1835     RADIOLOGY: Ct Renal Stone Study  Result Date: 10/07/2017 CLINICAL DATA:  Left flank pain radiating to the left groin today. Previous history of kidney stones. Prior cholecystectomy. EXAM: CT ABDOMEN AND PELVIS WITHOUT CONTRAST TECHNIQUE: Multidetector CT imaging of the abdomen and pelvis was performed following the standard protocol without IV contrast. COMPARISON:  12/28/2016 FINDINGS: Lower chest: Atelectasis in the lung bases. Hepatobiliary: Mild diffuse fatty infiltration of the liver with somewhat geographic pattern. No definite focal liver lesions. Appearance is similar to previous study. Surgical absence of the gallbladder. No bile duct dilatation. Pancreas: Unremarkable. No pancreatic ductal dilatation or surrounding inflammatory  changes. Spleen:  Normal in size without focal abnormality. Adrenals/Urinary Tract: No adrenal gland nodules. Bilateral renal cysts. Bilateral intrarenal stones, more numerous on the left. Largest stone is on the left and measures about 4 mm in diameter. There is a 6 mm stone in the distal left ureter just above the ureterovesical junction. Left hydronephrosis and hydroureter with stranding around the left kidney and ureter. No ureteral stone or obstruction on the right. Bladder wall is not thickened. No bladder stones. Stomach/Bowel: Diverticulosis of the colon. No evidence of diverticulitis. Stomach, small bowel, and colon are not abnormally distended. Appendix is normal. Vascular/Lymphatic: No significant vascular findings are present. No enlarged abdominal or pelvic lymph nodes. Reproductive: Prostate gland is enlarged, measuring 5.1 cm diameter. Other: Small left inguinal hernia containing fat. No free air or free fluid in the abdomen. Musculoskeletal: Mild degenerative changes in the spine. No destructive bone lesions. IMPRESSION: 1. 6 mm stone in the distal left ureter with moderate proximal obstruction. 2. Multiple bilateral nonobstructing intrarenal stones. 3. Diverticulosis of the colon.  No evidence of diverticulitis. 4. Diffuse fatty infiltration of the liver. 5. Enlarged prostate gland. 6. Small left inguinal hernia containing fat. Electronically Signed   By: Burman Nieves M.D.   On: 10/07/2017 00:34    EKG: No orders found for this or any previous visit.  IMPRESSION AND PLAN: 57 year old male patient with history of nephrolithiasis, hyperlipidemia presented to the emergency room with left flank pain.  Admitting diagnosis 1.  Left ureteral stone with moderate obstruction 2.  Left flank pain 3.  Hyperlipidemia 4.  History of nephrolithiasis Treatment plan Admit patient to medical floor inpatient service Urology consultation N.p.o. IV fluids Pain management with IV Dilaudid  All the records are  reviewed and case discussed with ED provider. Management plans discussed with the patient, family and they are in agreement.  CODE STATUS:FULL CODE Code Status History    Date Active Date Inactive Code Status Order ID Comments User Context   10/27/2016 13:38 10/27/2016 18:03 Full Code [id number]  Poggi, Excell Seltzer, MD Inpatient       TOTAL TIME TAKING CARE OF THIS PATIENT: 51 minutes.    Ihor Austin M.D on 10/07/2017 at 3:39 AM  Between 7am to 6pm - Pager - [phone]  After 6pm go to [email or web address] - password [email or web address] [facility]  [facility]  Office  [phone]  CC: Primary care physician; Jaclyn Shaggy, MD

## 2017-10-07 NOTE — ED Notes (Signed)
Patient transported to 220

## 2017-10-07 NOTE — Discharge Summary (Signed)
[facility] - [place] at [facility]   PATIENT NAME: John [name]    MR#:  [id number]  DATE OF BIRTH:  07-28-1961  DATE OF ADMISSION:  10/06/2017 ADMITTING PHYSICIAN: Ihor Austin, MD  DATE OF DISCHARGE: 10/07/2016  PRIMARY CARE PHYSICIAN: Jaclyn Shaggy, MD    ADMISSION DIAGNOSIS:  Ureteral colic [N23] Left flank pain [R10.9] Intractable pain [R52]  DISCHARGE DIAGNOSIS:  Active Problems:   Kidney stone   SECONDARY DIAGNOSIS:   Past Medical History:  Diagnosis Date  . History of kidney stones   . Hyperlipidemia     HOSPITAL COURSE:   1.  6 mm left ureteral kidney stone causing intractable pain on his left flank.  Since the patient's pain is completely gone the stone likely passed into the bladder.  Patient was seen by urology Dr. Apolinar Junes and she cleared to go home.  Follow-up with Dr. Lonna Cobb urology on Wednesday.  Patient already on Flomax.  Advised to stay hydrated.  Has PRN pain and nausea medications at home.  Patient was advised to strain all urine and collect kidney stone and bring to Dr. Lonna Cobb for analysis. 2.  Hyperlipidemia unspecified on Lipitor. 3.  Chronic kidney disease stage III.  Monitor as outpatient.  Likely worsened with kidney stone and ureter.  Since the kidney stone has likely passed into the bladder kidney function will probably improve.  DISCHARGE CONDITIONS:   Satisfactory  CONSULTS OBTAINED:  Treatment Team:  Vanna Scotland, MD  DRUG ALLERGIES:  No Known Allergies  DISCHARGE MEDICATIONS:   Allergies as of 10/07/2017   No Known Allergies     Medication List    STOP taking these medications   HYDROmorphone 2 MG tablet Commonly known as:  DILAUDID     TAKE these medications   atorvastatin 10 MG tablet Commonly known as:  LIPITOR Take 10 mg by mouth daily.   cyclobenzaprine 10 MG tablet Commonly known as:  FLEXERIL Take 10 mg by mouth daily as needed.   losartan 25 MG tablet Commonly known as:  COZAAR Take 1 tablet  by mouth daily.   ondansetron 4 MG disintegrating tablet Commonly known as:  ZOFRAN ODT Take 1 tablet (4 mg total) by mouth every 8 (eight) hours as needed for nausea or vomiting.   oxyCODONE 5 MG immediate release tablet Commonly known as:  ROXICODONE Take 1-2 tablets (5-10 mg total) by mouth every 4 (four) hours as needed for severe pain.   tamsulosin 0.4 MG Caps capsule Commonly known as:  FLOMAX Take 1 capsule (0.4 mg total) by mouth daily after breakfast.        DISCHARGE INSTRUCTIONS:    Follow-up PMD 2 weeks Follow-up with urology on Wednesday  If you experience worsening of your admission symptoms, develop shortness of breath, life threatening emergency, suicidal or homicidal thoughts you must seek medical attention immediately by calling 911 or calling your MD immediately  if symptoms less severe.  You Must read complete instructions/literature along with all the possible adverse reactions/side effects for all the Medicines you take and that have been prescribed to you. Take any new Medicines after you have completely understood and accept all the possible adverse reactions/side effects.   Please note  You were cared for by a hospitalist during your hospital stay. If you have any questions about your discharge medications or the care you received while you were in the hospital after you are discharged, you can call the unit and asked to speak with the hospitalist on  call if the hospitalist that took care of you is not available. Once you are discharged, your primary care physician will handle any further medical issues. Please note that NO REFILLS for any discharge medications will be authorized once you are discharged, as it is imperative that you return to your primary care physician (or establish a relationship with a primary care physician if you do not have one) for your aftercare needs so that they can reassess your need for medications and monitor your lab  values.    Today   CHIEF COMPLAINT:   Chief Complaint  Patient presents with  . Flank Pain    HISTORY OF PRESENT ILLNESS:  John [name]  is a 57 y.o. male presented with left flank pain   VITAL SIGNS:  Blood pressure 127/71, pulse 70, temperature 97.8 F (36.6 C), temperature source Oral, resp. rate 16, height 6\' 1"  (1.854 m), weight 102.9 kg (226 lb 14.4 oz), SpO2 93 %.    PHYSICAL EXAMINATION:  GENERAL:  57 y.o.-year-old patient lying in the bed with no acute distress.  EYES: Pupils equal, round, reactive to light and accommodation. No scleral icterus. Extraocular muscles intact.  HEENT: Head atraumatic, normocephalic. Oropharynx and nasopharynx clear.  NECK:  Supple, no jugular venous distention. No thyroid enlargement, no tenderness.  LUNGS: Normal breath sounds bilaterally, no wheezing, rales,rhonchi or crepitation. No use of accessory muscles of respiration.  CARDIOVASCULAR: S1, S2 normal. No murmurs, rubs, or gallops.  ABDOMEN: Soft, non-tender, non-distended. Bowel sounds present. No organomegaly or mass.  EXTREMITIES: No pedal edema, cyanosis, or clubbing.  NEUROLOGIC: Cranial nerves II through XII are intact. Muscle strength 5/5 in all extremities. Sensation intact. Gait not checked.  PSYCHIATRIC: The patient is alert and oriented x 3.  SKIN: No obvious rash, lesion, or ulcer.   DATA REVIEW:   CBC Recent Labs  Lab 10/07/17 0511  WBC 12.2*  HGB 15.4  HCT 46.7  PLT 220    Chemistries  Recent Labs  Lab 10/07/17 0511  NA 139  K 4.4  CL 106  CO2 26  GLUCOSE 112*  BUN 16  CREATININE 1.75*  CALCIUM 8.0*      RADIOLOGY:  Ct Renal Stone Study  Result Date: 10/07/2017 CLINICAL DATA:  Left flank pain radiating to the left groin today. Previous history of kidney stones. Prior cholecystectomy. EXAM: CT ABDOMEN AND PELVIS WITHOUT CONTRAST TECHNIQUE: Multidetector CT imaging of the abdomen and pelvis was performed following the standard protocol without IV  contrast. COMPARISON:  12/28/2016 FINDINGS: Lower chest: Atelectasis in the lung bases. Hepatobiliary: Mild diffuse fatty infiltration of the liver with somewhat geographic pattern. No definite focal liver lesions. Appearance is similar to previous study. Surgical absence of the gallbladder. No bile duct dilatation. Pancreas: Unremarkable. No pancreatic ductal dilatation or surrounding inflammatory changes. Spleen: Normal in size without focal abnormality. Adrenals/Urinary Tract: No adrenal gland nodules. Bilateral renal cysts. Bilateral intrarenal stones, more numerous on the left. Largest stone is on the left and measures about 4 mm in diameter. There is a 6 mm stone in the distal left ureter just above the ureterovesical junction. Left hydronephrosis and hydroureter with stranding around the left kidney and ureter. No ureteral stone or obstruction on the right. Bladder wall is not thickened. No bladder stones. Stomach/Bowel: Diverticulosis of the colon. No evidence of diverticulitis. Stomach, small bowel, and colon are not abnormally distended. Appendix is normal. Vascular/Lymphatic: No significant vascular findings are present. No enlarged abdominal or pelvic lymph nodes. Reproductive: Prostate gland  is enlarged, measuring 5.1 cm diameter. Other: Small left inguinal hernia containing fat. No free air or free fluid in the abdomen. Musculoskeletal: Mild degenerative changes in the spine. No destructive bone lesions. IMPRESSION: 1. 6 mm stone in the distal left ureter with moderate proximal obstruction. 2. Multiple bilateral nonobstructing intrarenal stones. 3. Diverticulosis of the colon.  No evidence of diverticulitis. 4. Diffuse fatty infiltration of the liver. 5. Enlarged prostate gland. 6. Small left inguinal hernia containing fat. Electronically Signed   By: Burman NievesWilliam  Stevens M.D.   On: 10/07/2017 00:34    EKG:  No orders found for this or any previous visit.    Management plans discussed with the  patient, family and they are in agreement.  CODE STATUS:     Code Status Orders  (From admission, onward)        Start     Ordered   10/07/17 0410  Full code  Continuous     10/07/17 0409    Code Status History    Date Active Date Inactive Code Status Order ID Comments User Context   10/27/2016 13:38 10/27/2016 18:03 Full Code [id number]  Poggi, Excell SeltzerJohn J, MD Inpatient    Advance Directive Documentation     Most Recent Value  Type of Advance Directive  Healthcare Power of Attorney, Living will  Pre-existing out of facility DNR order (yellow form or pink MOST form)  No data  "MOST" Form in Place?  No data      TOTAL TIME TAKING CARE OF THIS PATIENT: 31 minutes.    Alford Highlandichard Ade Stmarie M.D on 10/07/2017 at 9:32 AM  Between 7am to 6pm - Pager - [phone]  After 6pm go to [email or web address] - password [email or web address]  [facility] Office  [phone]  CC: Primary care physician; Jaclyn Shaggyate, Denny C, MD

## 2017-10-07 NOTE — Discharge Instructions (Signed)
Strain all urine and save stone to bring to dr Lonna CobbStoioff   Kidney Stones Kidney stones (urolithiasis) are rock-like masses that form inside of the kidneys. Kidneys are organs that make pee (urine). A kidney stone can cause very bad pain and can block the flow of pee. The stone usually leaves your body (passes) through your pee. You may need to have a doctor take out the stone. Follow these instructions at home: Eating and drinking  Drink enough fluid to keep your pee clear or pale yellow. This will help you pass the stone.  If told by your doctor, change the foods you eat (your diet). This may include: ? Limiting how much salt (sodium) you eat. ? Eating more fruits and vegetables. ? Limiting how much meat, poultry, fish, and eggs you eat.  Follow instructions from your doctor about eating or drinking restrictions. General instructions  Collect pee samples as told by your doctor. You may need to collect a pee sample: ? 24 hours after a stone comes out. ? 8-12 weeks after a stone comes out, and every 6-12 months after that.  Strain your pee every time you pee (urinate), for as long as told. Use the strainer that your doctor recommends.  Do not throw out the stone. Keep it so that it can be tested by your doctor.  Take over-the-counter and prescription medicines only as told by your doctor.  Keep all follow-up visits as told by your doctor. This is important. You may need follow-up tests. Preventing kidney stones To prevent another kidney stone:  Drink enough fluid to keep your pee clear or pale yellow. This is the best way to prevent kidney stones.  Eat healthy foods.  Avoid certain foods as told by your doctor. You may be told to eat less protein.  Stay at a healthy weight.  Contact a doctor if:  You have pain that gets worse or does not get better with medicine. Get help right away if:  You have a fever or chills.  You get very bad pain.  You get new pain in your belly  (abdomen).  You pass out (faint).  You cannot pee. This information is not intended to replace advice given to you by your health care provider. Make sure you discuss any questions you have with your health care provider. Document Released: 03/07/2008 Document Revised: 06/07/2016 Document Reviewed: 06/07/2016 Elsevier Interactive Patient Education  2017 [employer]

## 2017-10-07 NOTE — Consult Note (Signed)
Urology Consult  I have been asked to see the patient by Dr. Dolores Frame, for evaluation and management of left ureteral stone.  Chief Complaint: left flank pain  History of Present Illness: John [name] is a 57 y.o. year old with a history of nephrolithiasis who presented overnight to the ED with severe left flank admitted to the hospital for pain control.  He developed pain in his left flank x 3 days prior to admission which was moderate and intermittent but manageable.  He developed severe and poorly controlled pain, nausea which ultimately sent him to the ED.  No fevers, chills, vomiting, dysuria, gross hematuria, urgency or frequency.    CT scan showed 6 mm left UVJ stone with proximal hydroureteronephrosis.  He also has small nonobstructing stones bilaterally.    WBC 13, UA benign.  Hemodynamically stable.    Since arriving to the floor, he has had not further pain, currently 0/10 and has not required any further pain meds since ~2 am.  He has not yet seen his stone pass but suspects it may have passed into bladder.    Past Medical History:  Diagnosis Date  . History of kidney stones   . Hyperlipidemia     Past Surgical History:  Procedure Laterality Date  . CHOLECYSTECTOMY    . SHOULDER ARTHROSCOPY WITH SUBACROMIAL DECOMPRESSION Right 10/27/2016   Procedure: SHOULDER ARTHROSCOPY WITH SUBACROMIAL DECOMPRESSION  AND DEBRIDEMENT;  Surgeon: Christena Flake, MD;  Location: [facility] ORS;  Service: Orthopedics;  Laterality: Right;  . SHOULDER SURGERY Right 2003    Home Medications:  Current Meds  Medication Sig  . atorvastatin (LIPITOR) 10 MG tablet Take 10 mg by mouth daily.  . cyclobenzaprine (FLEXERIL) 10 MG tablet Take 10 mg by mouth daily as needed.  Marland Kitchen losartan (COZAAR) 25 MG tablet Take 1 tablet by mouth daily.  Marland Kitchen oxyCODONE (ROXICODONE) 5 MG immediate release tablet Take 1-2 tablets (5-10 mg total) by mouth every 4 (four) hours as needed for severe pain.  . tamsulosin (FLOMAX) 0.4  MG CAPS capsule Take 1 capsule (0.4 mg total) by mouth daily after breakfast.    Allergies: No Known Allergies  Family History  Problem Relation Age of Onset  . Heart disease Father     Social History:  reports that  has never smoked. His smokeless tobacco use includes snuff. He reports that he does not drink alcohol or use drugs.  ROS: A complete review of systems was performed.  All systems are negative except for pertinent findings as noted.  Physical Exam:  Vital signs in last 24 hours: Temp:  [97.5 F (36.4 C)-97.8 F (36.6 C)] 97.8 F (36.6 C) (01/05 0417) Pulse Rate:  [68-81] 70 (01/05 0417) Resp:  [16-26] 16 (01/05 0417) BP: (110-161)/(63-86) 127/71 (01/05 0417) SpO2:  [91 %-98 %] 93 % (01/05 0417) Weight:  [200 lb (90.7 kg)-226 lb 14.4 oz (102.9 kg)] 226 lb 14.4 oz (102.9 kg) (01/05 0417) Constitutional:  Alert and oriented, No acute distress.  Wife at bedside.  HEENT: [place] AT, moist mucus membranes.  Trachea midline, no masses Cardiovascular: Regular rate and rhythm, no clubbing, cyanosis, or edema. Respiratory: Normal respiratory effort. GI: Abdomen is soft, nontender, nondistended, no abdominal masses GU: No CVA tenderness  Skin: No rashes, bruises or suspicious lesions Neurologic: Grossly intact, no focal deficits, moving all 4 extremities Psychiatric: Normal mood and affect   Laboratory Data:  Recent Labs    10/06/17 2351 10/07/17 0511  WBC 13.2* 12.2*  HGB 15.3 15.4  HCT 46.3 46.7   Recent Labs    10/06/17 2351 10/07/17 0511  NA 139 139  K 3.8 4.4  CL 107 106  CO2 22 26  GLUCOSE 114* 112*  BUN 17 16  CREATININE 1.57* 1.75*  CALCIUM 8.1* 8.0*   No results for input(s): LABPT, INR in the last 72 hours. No results for input(s): LABURIN in the last 72 hours. No results found for this or any previous visit.   Radiologic Imaging: Ct Renal Stone Study  Result Date: 10/07/2017 CLINICAL DATA:  Left flank pain radiating to the left groin today.  Previous history of kidney stones. Prior cholecystectomy. EXAM: CT ABDOMEN AND PELVIS WITHOUT CONTRAST TECHNIQUE: Multidetector CT imaging of the abdomen and pelvis was performed following the standard protocol without IV contrast. COMPARISON:  12/28/2016 FINDINGS: Lower chest: Atelectasis in the lung bases. Hepatobiliary: Mild diffuse fatty infiltration of the liver with somewhat geographic pattern. No definite focal liver lesions. Appearance is similar to previous study. Surgical absence of the gallbladder. No bile duct dilatation. Pancreas: Unremarkable. No pancreatic ductal dilatation or surrounding inflammatory changes. Spleen: Normal in size without focal abnormality. Adrenals/Urinary Tract: No adrenal gland nodules. Bilateral renal cysts. Bilateral intrarenal stones, more numerous on the left. Largest stone is on the left and measures about 4 mm in diameter. There is a 6 mm stone in the distal left ureter just above the ureterovesical junction. Left hydronephrosis and hydroureter with stranding around the left kidney and ureter. No ureteral stone or obstruction on the right. Bladder wall is not thickened. No bladder stones. Stomach/Bowel: Diverticulosis of the colon. No evidence of diverticulitis. Stomach, small bowel, and colon are not abnormally distended. Appendix is normal. Vascular/Lymphatic: No significant vascular findings are present. No enlarged abdominal or pelvic lymph nodes. Reproductive: Prostate gland is enlarged, measuring 5.1 cm diameter. Other: Small left inguinal hernia containing fat. No free air or free fluid in the abdomen. Musculoskeletal: Mild degenerative changes in the spine. No destructive bone lesions. IMPRESSION: 1. 6 mm stone in the distal left ureter with moderate proximal obstruction. 2. Multiple bilateral nonobstructing intrarenal stones. 3. Diverticulosis of the colon.  No evidence of diverticulitis. 4. Diffuse fatty infiltration of the liver. 5. Enlarged prostate gland. 6.  Small left inguinal hernia containing fat. Electronically Signed   By: Burman NievesWilliam  Stevens M.D.   On: 10/07/2017 00:34   CT scan personally reviewed today.  Impression/Assessment:  57 yo F with 6 mm L UVJ stone admitted for severe pain which has now resolved.  Highly suspicious for interval stone passage given complete resolution of pain.  No concern for infection.  Plan:  -OK to d/c home -continue flomax -strain all urine, send for analysis if possible -f/u with Dr. Lonna CobbStoioff as scheduled Wednesday, will likely need KUB on that day if unable to catch stone  10/07/2017, 8:19 AM  Vanna ScotlandAshley Davelyn Gwinn,  MD

## 2017-10-07 NOTE — Progress Notes (Signed)
Daven A [place]  A and O x 4. VSS. No complaints of pain or nausea. IV removed intact. Pt voiced understanding of discharge instructions with no further questions. Pt discharged via wheelchair with nurse tech.     Allergies as of 10/07/2017   No Known Allergies     Medication List    STOP taking these medications   HYDROmorphone 2 MG tablet Commonly known as:  DILAUDID     TAKE these medications   atorvastatin 10 MG tablet Commonly known as:  LIPITOR Take 10 mg by mouth daily.   cyclobenzaprine 10 MG tablet Commonly known as:  FLEXERIL Take 10 mg by mouth daily as needed.   losartan 25 MG tablet Commonly known as:  COZAAR Take 1 tablet by mouth daily.   ondansetron 4 MG disintegrating tablet Commonly known as:  ZOFRAN ODT Take 1 tablet (4 mg total) by mouth every 8 (eight) hours as needed for nausea or vomiting.   oxyCODONE 5 MG immediate release tablet Commonly known as:  ROXICODONE Take 1-2 tablets (5-10 mg total) by mouth every 4 (four) hours as needed for severe pain.   tamsulosin 0.4 MG Caps capsule Commonly known as:  FLOMAX Take 1 capsule (0.4 mg total) by mouth daily after breakfast.       Vitals:   10/07/17 0342 10/07/17 0417  BP:  127/71  Pulse: 68 70  Resp:  16  Temp:  97.8 F (36.6 C)  SpO2: 92% 93%    Suzzanne CloudJuan G Rodriguez Ornelas

## 2017-10-08 LAB — URINE CULTURE: CULTURE: NO GROWTH

## 2017-10-09 LAB — HIV ANTIBODY (ROUTINE TESTING W REFLEX): HIV SCREEN 4TH GENERATION: NONREACTIVE

## 2017-10-10 ENCOUNTER — Emergency Department
Admission: EM | Admit: 2017-10-10 | Discharge: 2017-10-10 | Disposition: A | Discharge status: Home / Self Care | Payer: BLUE CROSS/BLUE SHIELD | Attending: Emergency Medicine | Admitting: Emergency Medicine

## 2017-10-10 ENCOUNTER — Encounter: Payer: Self-pay | Admitting: Emergency Medicine

## 2017-10-10 ENCOUNTER — Emergency Department: Payer: BLUE CROSS/BLUE SHIELD

## 2017-10-10 DIAGNOSIS — Z79899 Other long term (current) drug therapy: Secondary | ICD-10-CM | POA: Diagnosis not present

## 2017-10-10 DIAGNOSIS — N23 Unspecified renal colic: Secondary | ICD-10-CM | POA: Insufficient documentation

## 2017-10-10 DIAGNOSIS — R1032 Left lower quadrant pain: Secondary | ICD-10-CM | POA: Diagnosis present

## 2017-10-10 DIAGNOSIS — K5900 Constipation, unspecified: Secondary | ICD-10-CM

## 2017-10-10 LAB — CBC WITH DIFFERENTIAL/PLATELET
Basophils Absolute: 0 10*3/uL (ref 0–0.1)
Basophils Relative: 0 %
Eosinophils Absolute: 0.1 10*3/uL (ref 0–0.7)
Eosinophils Relative: 1 %
HEMATOCRIT: 46 % (ref 40.0–52.0)
HEMOGLOBIN: 15.6 g/dL (ref 13.0–18.0)
LYMPHS ABS: 1 10*3/uL (ref 1.0–3.6)
LYMPHS PCT: 11 %
MCH: 30.1 pg (ref 26.0–34.0)
MCHC: 33.9 g/dL (ref 32.0–36.0)
MCV: 89 fL (ref 80.0–100.0)
MONOS PCT: 9 %
Monocytes Absolute: 0.8 10*3/uL (ref 0.2–1.0)
NEUTROS PCT: 79 %
Neutro Abs: 7.3 10*3/uL — ABNORMAL HIGH (ref 1.4–6.5)
Platelets: 250 10*3/uL (ref 150–440)
RBC: 5.17 MIL/uL (ref 4.40–5.90)
RDW: 14.2 % (ref 11.5–14.5)
WBC: 9.3 10*3/uL (ref 3.8–10.6)

## 2017-10-10 LAB — URINALYSIS, ROUTINE W REFLEX MICROSCOPIC
BACTERIA UA: NONE SEEN
BILIRUBIN URINE: NEGATIVE
GLUCOSE, UA: NEGATIVE mg/dL
Ketones, ur: NEGATIVE mg/dL
Leukocytes, UA: NEGATIVE
Nitrite: NEGATIVE
PH: 6 (ref 5.0–8.0)
PROTEIN: NEGATIVE mg/dL
Specific Gravity, Urine: 1.01 (ref 1.005–1.030)
Squamous Epithelial / LPF: NONE SEEN

## 2017-10-10 LAB — BASIC METABOLIC PANEL
Anion gap: 10 (ref 5–15)
BUN: 15 mg/dL (ref 6–20)
CHLORIDE: 105 mmol/L (ref 101–111)
CO2: 23 mmol/L (ref 22–32)
CREATININE: 1.5 mg/dL — AB (ref 0.61–1.24)
Calcium: 8.9 mg/dL (ref 8.9–10.3)
GFR calc Af Amer: 58 mL/min — ABNORMAL LOW (ref >60)
GFR calc non Af Amer: 50 mL/min — ABNORMAL LOW (ref >60)
Glucose, Bld: 108 mg/dL — ABNORMAL HIGH (ref 65–99)
POTASSIUM: 4.1 mmol/L (ref 3.5–5.1)
SODIUM: 138 mmol/L (ref 135–145)

## 2017-10-10 MED ORDER — SODIUM CHLORIDE 0.9 % IV SOLN
Freq: Once | INTRAVENOUS | Status: AC
  Administered 2017-10-10: 06:00:00 via INTRAVENOUS

## 2017-10-10 MED ORDER — LIDOCAINE HCL (CARDIAC) 20 MG/ML IV SOLN
INTRAVENOUS | Status: AC
  Filled 2017-10-10: qty 5

## 2017-10-10 MED ORDER — TAMSULOSIN HCL 0.4 MG PO CAPS: ORAL_CAPSULE | ORAL | 0 refills | Status: DC

## 2017-10-10 MED ORDER — ONDANSETRON HCL 4 MG/2ML IJ SOLN
4.0000 mg | Freq: Once | INTRAMUSCULAR | Status: AC
  Administered 2017-10-10: 4 mg via INTRAVENOUS
  Filled 2017-10-10: qty 2

## 2017-10-10 MED ORDER — ONDANSETRON 4 MG PO TBDP: ORAL_TABLET | ORAL | 0 refills | Status: DC

## 2017-10-10 MED ORDER — MORPHINE SULFATE (PF) 4 MG/ML IV SOLN
4.0000 mg | Freq: Once | INTRAVENOUS | Status: AC
  Administered 2017-10-10: 4 mg via INTRAVENOUS
  Filled 2017-10-10: qty 1

## 2017-10-10 MED ORDER — LIDOCAINE HCL (CARDIAC) 20 MG/ML IV SOLN
1.5000 mg/kg | INTRAVENOUS | Status: AC
  Administered 2017-10-10: 153.8 mg via INTRAVENOUS
  Filled 2017-10-10: qty 10

## 2017-10-10 MED ORDER — OXYCODONE-ACETAMINOPHEN 5-325 MG PO TABS: 1.0000 | ORAL_TABLET | ORAL | 0 refills | Status: DC | PRN

## 2017-10-10 NOTE — ED Provider Notes (Signed)
[facility] Emergency Department Provider Note  ____________________________________________   First MD Initiated Contact with Patient 10/10/17 0408     (approximate)  I have reviewed the triage vital signs and the nursing notes.   HISTORY  Chief Complaint Flank Pain    HPI John [name] is a 57 y.o. male with an extensive history of difficult to treat kidney stones which have required multiple procedures in the past.  He presents tonight for evaluation of severe left flank and lower abdominal pain similar to prior.  He was seen in the emergency department about 72 hours ago with the same symptoms.  He had a CT scan which demonstrated a 6 mm stone in the distal left ureter and he was admitted for pain control and urology evaluation.  When Dr. Apolinar Junes the urologist saw the patient in the hospital a few hours later, the pain was completely gone and the patient agreed with the plan for discharge and follow-up with Dr. Lonna Cobb, his regular urologist.  The patient and his wife report that the pain started up again later the same day or early the next day and gradually getting worse over the last 2-3 days.  It is now severe and nothing makes it better or worse.  He cannot find a position of comfort.  He is still able to urinate and has no gross hematuria or dysuria.  He has been constipated since starting pain medication in spite of taking stool softeners.  He has had some nausea but no vomiting.  Past Medical History:  Diagnosis Date  . History of kidney stones   . Hyperlipidemia     Patient Active Problem List   Diagnosis Date Noted  . Kidney stone 10/07/2017    Past Surgical History:  Procedure Laterality Date  . CHOLECYSTECTOMY    . SHOULDER ARTHROSCOPY WITH SUBACROMIAL DECOMPRESSION Right 10/27/2016   Procedure: SHOULDER ARTHROSCOPY WITH SUBACROMIAL DECOMPRESSION  AND DEBRIDEMENT;  Surgeon: Christena Flake, MD;  Location: [facility] ORS;  Service: Orthopedics;   Laterality: Right;  . SHOULDER SURGERY Right 2003    Prior to Admission medications   Medication Sig Start Date End Date Taking? Authorizing Provider  atorvastatin (LIPITOR) 10 MG tablet Take 10 mg by mouth daily.   Yes [provider]  losartan (COZAAR) 25 MG tablet Take 1 tablet by mouth daily. 10/06/17  Yes [provider]  cyclobenzaprine (FLEXERIL) 10 MG tablet Take 10 mg by mouth daily as needed.    [provider]  ondansetron (ZOFRAN ODT) 4 MG disintegrating tablet Allow 1-2 tablets to dissolve in your mouth every 8 hours as needed for nausea/vomiting 10/10/17   Loleta Rose, MD  oxyCODONE-acetaminophen (ROXICET) 5-325 MG tablet Take 1-2 tablets by mouth every 4 (four) hours as needed for severe pain. 10/10/17   Loleta Rose, MD  tamsulosin (FLOMAX) 0.4 MG CAPS capsule Take 1 tablet by mouth daily until you pass the kidney stone or no longer have symptoms 10/10/17   Loleta Rose, MD    Allergies Patient has no known allergies.  Family History  Problem Relation Age of Onset  . Heart disease Father     Social History Social History   Tobacco Use  . Smoking status: Never Smoker  . Smokeless tobacco: Current User    Types: Snuff  Substance Use Topics  . Alcohol use: No  . Drug use: No    Review of Systems Constitutional: No fever/chills ENT: No Congestion Cardiovascular: Denies chest pain. Respiratory: Denies shortness  of breath. Gastrointestinal: Left-sided flank and lower abdominal pain similar to prior.  Nausea, no vomiting.  +Constipation. Genitourinary: Negative for dysuria and gross hematuria Musculoskeletal: Negative for neck pain.  Left sided flank pain Integumentary: Negative for rash. Neurological: Negative for headaches, focal weakness or numbness. Psychiatric:No psychiatric complaints at this time  ____________________________________________   PHYSICAL EXAM:  VITAL SIGNS: ED Triage Vitals  Enc Vitals Group     BP 10/10/17  0323 (!) 162/91     Pulse Rate 10/10/17 0323 88     Resp 10/10/17 0323 18     Temp 10/10/17 0323 97.8 F (36.6 C)     Temp Source 10/10/17 0323 Oral     SpO2 10/10/17 0323 98 %     Weight 10/10/17 0323 102.5 kg (226 lb)     Height --      Head Circumference --      Peak Flow --      Pain Score 10/10/17 0410 10     Pain Loc --      Pain Edu? --      Excl. in GC? --     Constitutional: Alert and oriented.  Appears to be in moderate distress after 1 dose of morphine 4 mg Eyes: Conjunctivae are normal.  Head: Atraumatic. Nose: No congestion/rhinnorhea. Mouth/Throat: Mucous membranes are moist. Neck: No stridor.  No meningeal signs.   Cardiovascular: Normal rate, regular rhythm. Good peripheral circulation. Grossly normal heart sounds. Respiratory: Normal respiratory effort.  No retractions. Lungs CTAB. Gastrointestinal: Soft with diffuse and mild tenderness to palpation of the left side of his abdomen and left CVA tenderness.  No rebound and no guarding, no distention. Musculoskeletal: No lower extremity tenderness nor edema. No gross deformities of extremities. Neurologic:  Normal speech and language. No gross focal neurologic deficits are appreciated.  Skin:  Skin is warm, dry and intact. No rash noted. Psychiatric: Mood and affect are normal. Speech and behavior are normal.  ____________________________________________   LABS (all labs ordered are listed, but only abnormal results are displayed)  Labs Reviewed  CBC WITH DIFFERENTIAL/PLATELET - Abnormal; Notable for the following components:      Result Value   Neutro Abs 7.3 (*)    All other components within normal limits  BASIC METABOLIC PANEL - Abnormal; Notable for the following components:   Glucose, Bld 108 (*)    Creatinine, Ser 1.50 (*)    GFR calc non Af Amer 50 (*)    GFR calc Af Amer 58 (*)    All other components within normal limits  URINALYSIS, ROUTINE W REFLEX MICROSCOPIC - Abnormal; Notable for the  following components:   Color, Urine YELLOW (*)    APPearance CLEAR (*)    Hgb urine dipstick SMALL (*)    All other components within normal limits   ____________________________________________  EKG  None - EKG not ordered by ED physician ____________________________________________  RADIOLOGY   Dg Abdomen 1 View  Result Date: 10/10/2017 CLINICAL DATA:  Increasing left flank pain. Recent diagnosis of ureteral stone, evaluate for progression. EXAM: ABDOMEN - 1 VIEW COMPARISON:  CT 10/07/2017 FINDINGS: Previous distal ureteral calculus is tentatively but not definitively identified overlying the sacrum in the left pelvis. Patient's known bilateral renal calculi are not well visualized. Few scattered air-fluid levels within bowel loops in the right abdomen suggest ileus. Moderate colonic stool burden. Cholecystectomy clips in the right upper quadrant. Left lung base subsegmental atelectasis. IMPRESSION: 1. Left distal ureteral calculus is tentatively identified overlying the  sacrum, similar in location to prior CT. 2. Known bilateral nonobstructing renal calculi are not well seen radiographically. 3. Few air-fluid levels within bowel loops in the right abdomen suggests ileus. Electronically Signed   By: Rubye Oaks M.D.   On: 10/10/2017 05:01    ____________________________________________   PROCEDURES  Critical Care performed: No   Procedure(s) performed:   Procedures   ____________________________________________   INITIAL IMPRESSION / ASSESSMENT AND PLAN / ED COURSE  As part of my medical decision making, I reviewed the following data within the electronic MEDICAL RECORD NUMBER Nursing notes reviewed and incorporated, Labs reviewed , Patient signed out to Dr. Mayford Knife, A consult was requested and obtained from this/these consultant(s) Urology and Notes from prior ED visits  Differential diagnosis includes, but is not limited to, ureteral/renal colic due to obstructive stone,  UTI/pyelonephritis, renal infarction, etc.  Most likely the patient's stone has not passed and is still stuck in the distal left ureter.  I obtained a radiograph as well as the plan for urology and the radiologist believes that the stone appears to be in about the same place which is overlying the sacrum.  Given that the morphine was not sufficient in relieving the pain I will give him renal lidocaine (1.5 mg/kg IV and 250 mL of normal saline administered over 15 minutes).  I will also contact on-call urology to discuss the case to find out if they recommend additional CT scan, in person assessment, etc.  Clinical Course as of Oct 10 741  Tue Oct 10, 2017  0612 After the lidocaine 1.5 mg/kg IV, the patient reports that his pain is 0.  I called and spoke by phone with Dr. Annabell Howells (urology).  He recommended against another CT scan given that we have a scan from several days ago and the radiograph tonight.  He recommended that we keep the patient in the emergency department until after 7 AM to reassess his pain and to have Dr. Apolinar Junes see him in person if possible given that she would be the one to perform any procedure.  Given the fact that he is a bounce back it seems likely that he will return again, but if his pain returns and is as severe as before he may not make it another 24+ hours until he sees Dr. Lonna Cobb.  I updated the patient and his wife with this plan and they understand and agree.  [CF]  [id number] I called and spoke by phone with Dr. Apolinar Junes and discussed the case.  She explained that she and Dr. Lonna Cobb both have completely full schedules today and that there is no way that a patient with minimal or even completely resolved pain would be taken for a procedure today, particularly when he has a follow-up appointment scheduled for tomorrow in clinic.  She said she would facilitate him being on the list for lithotripsy on Thursday.  I updated the patient and his wife and the patient reports that his pain is  still a 0 or 1.  I explained the situation and he understands and agrees with the plan for follow-up.  I will give him another prescription for pain medicine because he is almost out and I encouraged increasing his bowel regimen to help with constipation.  I gave my usual customary return precautions.  [CF]    Clinical Course User Index [CF] Loleta Rose, MD    ____________________________________________  FINAL CLINICAL IMPRESSION(S) / ED DIAGNOSES  Final diagnoses:  Ureteral colic  Constipation, unspecified constipation type  MEDICATIONS GIVEN DURING THIS VISIT:  Medications  morphine 4 MG/ML injection 4 mg (4 mg Intravenous Given 10/10/17 0415)  ondansetron (ZOFRAN) injection 4 mg (4 mg Intravenous Given 10/10/17 0415)  lidocaine (cardiac) 100 mg/235ml (XYLOCAINE) 20 MG/ML injection 2% 153.8 mg (153.8 mg Intravenous Given 10/10/17 0538)  0.9 %  sodium chloride infusion ( Intravenous New Bag/Given 10/10/17 0617)     ED Discharge Orders        Ordered    oxyCODONE-acetaminophen (ROXICET) 5-325 MG tablet  Every 4 hours PRN     10/10/17 0742    ondansetron (ZOFRAN ODT) 4 MG disintegrating tablet     10/10/17 0742    tamsulosin (FLOMAX) 0.4 MG CAPS capsule     10/10/17 [id number]       Note:  This document was prepared using Dragon voice recognition software and may include unintentional dictation errors.    Loleta RoseForbach, Vittorio Mohs, MD 10/10/17 [phone]

## 2017-10-10 NOTE — ED Triage Notes (Signed)
Pt c/o left flank pain since Thursday. Pt seen in ED on Thursday and diagnosed with kidney stone, 6mm. Pt back to ED this AM due to increase in pain.

## 2017-10-10 NOTE — Discharge Instructions (Signed)
You have been seen in the Emergency Department (ED) today for pain that we believe based on your workup, is caused by the same kidney stone that does not seem to have passed or moved from several days ago.  As we have discussed, please drink plenty of fluids.  Please make a follow up appointment with the physician(s) listed elsewhere in this documentation.  You may take pain medication as needed but ONLY as prescribed.  Please also take your prescribed Flomax daily.    Please see your doctor as soon as possible as stones may take 1-3 weeks to pass and you may require additional care or medications.  Do not drink alcohol, drive or participate in any other potentially dangerous activities while taking opiate pain medication as it may make you sleepy. Do not take this medication with any other sedating medications, either prescription or over-the-counter. If you were prescribed Percocet or Vicodin, do not take these with acetaminophen (Tylenol) as it is already contained within these medications.   Take Percocet as needed for severe pain.  This medication is an opiate (or narcotic) pain medication and can be habit forming.  Use it as little as possible to achieve adequate pain control.  Do not use or use it with extreme caution if you have a history of opiate abuse or dependence.  If you are on a pain contract with your primary care doctor or a pain specialist, be sure to let them know you were prescribed this medication today from the [facility].  This medication is intended for your use only - do not give any to anyone else and keep it in a secure place where nobody else, especially children, have access to it.  It will also cause or worsen constipation, so you may want to consider taking an over-the-counter stool softener while you are taking this medication.  Return to the Emergency Department (ED) or call your doctor if you have any worsening pain, fever, painful urination,  are unable to urinate, or develop other symptoms that concern you.  Additionally, please consider the following recommendations to help with your constipation: 1)  Colace (or Dulcolax) 100 mg:  This is a stool softener, and you may take it once or twice a day as needed. (you are already taking this) 2)  Senna tablets:  This is a bowel stimulant that will help "push" out your stool. It is the next step to add after you have tried a stool softener. 3)  Miralax (powder):  This medication works by drawing additional fluid into your intestines and helps to flush out your stool.  Mix the powder with water or juice according to label instructions.  It may help if the Colace and Senna are not sufficient, but you must be sure to use the recommended amount of water or juice when you mix up the powder. 4)  Look for magnesium citrate at the pharmacy (it is usually a small glass bottle).  Drink the bottle according to the label instructions.

## 2017-10-11 ENCOUNTER — Ambulatory Visit: Payer: Self-pay | Admitting: Urology

## 2017-10-11 ENCOUNTER — Other Ambulatory Visit: Payer: Self-pay

## 2017-10-11 ENCOUNTER — Ambulatory Visit (INDEPENDENT_AMBULATORY_CARE_PROVIDER_SITE_OTHER): Payer: BLUE CROSS/BLUE SHIELD | Admitting: Urology

## 2017-10-11 ENCOUNTER — Encounter: Payer: Self-pay | Admitting: Urology

## 2017-10-11 ENCOUNTER — Observation Stay
Admission: AD | Admit: 2017-10-11 | Discharge: 2017-10-12 | Disposition: A | Discharge status: Home / Self Care | Payer: BLUE CROSS/BLUE SHIELD | Source: Ambulatory Visit | Attending: Urology | Admitting: Urology

## 2017-10-11 VITALS — BP 146/83 | HR 92 | Ht 73.0 in | Wt 227.0 lb

## 2017-10-11 DIAGNOSIS — N23 Unspecified renal colic: Secondary | ICD-10-CM | POA: Diagnosis not present

## 2017-10-11 DIAGNOSIS — N400 Enlarged prostate without lower urinary tract symptoms: Secondary | ICD-10-CM | POA: Diagnosis not present

## 2017-10-11 DIAGNOSIS — Z720 Tobacco use: Secondary | ICD-10-CM | POA: Insufficient documentation

## 2017-10-11 DIAGNOSIS — Z79899 Other long term (current) drug therapy: Secondary | ICD-10-CM | POA: Insufficient documentation

## 2017-10-11 DIAGNOSIS — Z8051 Family history of malignant neoplasm of kidney: Secondary | ICD-10-CM | POA: Diagnosis not present

## 2017-10-11 DIAGNOSIS — E785 Hyperlipidemia, unspecified: Secondary | ICD-10-CM | POA: Diagnosis not present

## 2017-10-11 DIAGNOSIS — N202 Calculus of kidney with calculus of ureter: Secondary | ICD-10-CM | POA: Diagnosis present

## 2017-10-11 DIAGNOSIS — Z8042 Family history of malignant neoplasm of prostate: Secondary | ICD-10-CM | POA: Insufficient documentation

## 2017-10-11 DIAGNOSIS — N200 Calculus of kidney: Secondary | ICD-10-CM | POA: Diagnosis not present

## 2017-10-11 DIAGNOSIS — Z8249 Family history of ischemic heart disease and other diseases of the circulatory system: Secondary | ICD-10-CM | POA: Diagnosis not present

## 2017-10-11 LAB — URINALYSIS, COMPLETE
Bilirubin, UA: NEGATIVE
Glucose, UA: NEGATIVE
KETONES UA: NEGATIVE
Leukocytes, UA: NEGATIVE
NITRITE UA: NEGATIVE
PROTEIN UA: NEGATIVE
Specific Gravity, UA: 1.02 (ref 1.005–1.030)
Urobilinogen, Ur: 0.2 mg/dL (ref 0.2–1.0)
pH, UA: 5.5 (ref 5.0–7.5)

## 2017-10-11 LAB — MICROSCOPIC EXAMINATION
Bacteria, UA: NONE SEEN
Epithelial Cells (non renal): NONE SEEN /hpf (ref 0–10)
WBC, UA: NONE SEEN /hpf (ref 0–?)

## 2017-10-11 MED ORDER — OXYCODONE HCL 5 MG PO TABS
5.0000 mg | ORAL_TABLET | ORAL | Status: DC | PRN
  Administered 2017-10-11: 5 mg via ORAL
  Filled 2017-10-11: qty 1

## 2017-10-11 MED ORDER — KETOROLAC TROMETHAMINE 30 MG/ML IJ SOLN
30.0000 mg | Freq: Once | INTRAMUSCULAR | Status: AC
  Administered 2017-10-11: 30 mg via INTRAVENOUS
  Filled 2017-10-11: qty 1

## 2017-10-11 MED ORDER — CEFAZOLIN SODIUM-DEXTROSE 2-4 GM/100ML-% IV SOLN
2.0000 g | INTRAVENOUS | Status: AC
  Administered 2017-10-12: 2 g via INTRAVENOUS
  Filled 2017-10-11: qty 100

## 2017-10-11 MED ORDER — SODIUM CHLORIDE 0.9 % IV SOLN
INTRAVENOUS | Status: DC
  Administered 2017-10-11: 10:00:00 via INTRAVENOUS

## 2017-10-11 MED ORDER — KETOROLAC TROMETHAMINE 15 MG/ML IJ SOLN
15.0000 mg | Freq: Three times a day (TID) | INTRAMUSCULAR | Status: DC | PRN
  Administered 2017-10-12: 15 mg via INTRAVENOUS
  Filled 2017-10-11: qty 1

## 2017-10-11 MED ORDER — MORPHINE SULFATE (PF) 2 MG/ML IV SOLN
2.0000 mg | INTRAVENOUS | Status: DC | PRN
  Administered 2017-10-11 (×2): 4 mg via INTRAVENOUS
  Filled 2017-10-11 (×2): qty 2

## 2017-10-11 NOTE — Progress Notes (Signed)
9:05 AM   John [name] [birth year] [id number]  Referring provider: Jaclyn Shaggyate, Denny C, MD [address]   [place], [place] [id number]  Chief Complaint  Patient presents with  . Nephrolithiasis    HPI: 57 year old male presents for evaluation of left renal colic.  He has a long history of recurrent stone disease.  He presented to the ED on 10/06/2016 with left renal colic.  A CT showed a 6 mm left distal ureteral calculus and bilateral renal calculi.  He was subsequently admitted for pain control and was seen by Dr. Apolinar JunesBrandon on 1/5 and his pain had resolved at the time of that visit.  He was discharged that day however return to the ED yesterday with recurrent renal colic.  His pain was again controlled however this morning he had acute onset of left flank pain.  There were no identifiable precipitating, aggravating or alleviating factors.  He denies nausea, vomiting, fever or chills.   PMH: Past Medical History:  Diagnosis Date  . History of kidney stones   . Hyperlipidemia     Surgical History: Past Surgical History:  Procedure Laterality Date  . CHOLECYSTECTOMY    . SHOULDER ARTHROSCOPY WITH SUBACROMIAL DECOMPRESSION Right 10/27/2016   Procedure: SHOULDER ARTHROSCOPY WITH SUBACROMIAL DECOMPRESSION  AND DEBRIDEMENT;  Surgeon: Christena FlakeJohn J Poggi, MD;  Location: [facility] ORS;  Service: Orthopedics;  Laterality: Right;  . SHOULDER SURGERY Right 2003    Home Medications:  Allergies as of 10/11/2017   No Known Allergies     Medication List        Accurate as of 10/11/17  9:05 AM. Always use your most recent med list.          atorvastatin 10 MG tablet Commonly known as:  LIPITOR Take 10 mg by mouth daily.   cyclobenzaprine 10 MG tablet Commonly known as:  FLEXERIL Take 10 mg by mouth daily as needed.   losartan 25 MG tablet Commonly known as:  COZAAR Take 1 tablet by mouth daily.   ondansetron 4 MG disintegrating tablet Commonly known as:  ZOFRAN ODT Allow 1-2 tablets to dissolve  in your mouth every 8 hours as needed for nausea/vomiting   oxyCODONE-acetaminophen 5-325 MG tablet Commonly known as:  ROXICET Take 1-2 tablets by mouth every 4 (four) hours as needed for severe pain.   tamsulosin 0.4 MG Caps capsule Commonly known as:  FLOMAX Take 1 tablet by mouth daily until you pass the kidney stone or no longer have symptoms       Allergies: No Known Allergies  Family History: Family History  Problem Relation Age of Onset  . Heart disease Father   . Prostate cancer Neg Hx   . Bladder Cancer Neg Hx   . Kidney cancer Neg Hx     Social History:  reports that  has never smoked. His smokeless tobacco use includes snuff. He reports that he does not drink alcohol or use drugs.  ROS: UROLOGY Frequent Urination?: No Hard to postpone urination?: No Burning/pain with urination?: No Get up at night to urinate?: No Leakage of urine?: No Urine stream starts and stops?: No Trouble starting stream?: No Do you have to strain to urinate?: No Blood in urine?: No Urinary tract infection?: No Sexually transmitted disease?: No Injury to kidneys or bladder?: No Painful intercourse?: No Weak stream?: No Erection problems?: No Penile pain?: No  Gastrointestinal Nausea?: No Vomiting?: No Indigestion/heartburn?: No Diarrhea?: No Constipation?: No  Constitutional Fever: No Night sweats?: No Weight  loss?: No Fatigue?: No  Skin Skin rash/lesions?: No Itching?: No  Eyes Blurred vision?: No Double vision?: No  Ears/Nose/Throat Sore throat?: No Sinus problems?: No  Hematologic/Lymphatic Swollen glands?: No Easy bruising?: No  Cardiovascular Leg swelling?: No Chest pain?: No  Respiratory Cough?: No Shortness of breath?: No  Endocrine Excessive thirst?: No  Musculoskeletal Back pain?: No Joint pain?: No  Neurological Headaches?: No Dizziness?: No  Psychologic Depression?: No Anxiety?: No  Physical Exam: BP (!) 146/83 (BP Location:  Left Arm, Patient Position: Sitting, Cuff Size: Normal)   Pulse 92   Ht 6\' 1"  (1.854 m)   Wt 227 lb (103 kg)   BMI 29.95 kg/m   Constitutional:  Alert and oriented, No acute distress. HEENT: [place] AT, moist mucus membranes.  Trachea midline, no masses. Cardiovascular: No clubbing, cyanosis, or edema. CV RRR Respiratory: Normal respiratory effort, no increased work of breathing.  Lungs clear GI: Abdomen is soft, nontender, nondistended, no abdominal masses GU: Mild left CVA tenderness.  Skin: No rashes, bruises or suspicious lesions. Lymph: No cervical or inguinal adenopathy. Neurologic: Grossly intact, no focal deficits, moving all 4 extremities. Psychiatric: Normal mood and affect.  Laboratory Data: Lab Results  Component Value Date   WBC 9.3 10/10/2017   HGB 15.6 10/10/2017   HCT 46.0 10/10/2017   MCV 89.0 10/10/2017   PLT 250 10/10/2017    Lab Results  Component Value Date   CREATININE 1.50 (H) 10/10/2017    Urinalysis Lab Results  Component Value Date   APPEARANCEUR CLEAR (A) 10/10/2017   LEUKOCYTESUR NEGATIVE 10/10/2017   PROTEINUR NEGATIVE 10/10/2017   GLUCOSEU NEGATIVE 10/10/2017   RBCU 0-5 10/10/2017   BILIRUBINUR NEGATIVE 10/10/2017   NITRITE NEGATIVE 10/10/2017    Lab Results  Component Value Date   BACTERIA NONE SEEN 10/10/2017    Pertinent Imaging: CT/KUB reviewed Results for orders placed during the hospital encounter of 10/10/17  DG Abdomen 1 View   Narrative CLINICAL DATA:  Increasing left flank pain. Recent diagnosis of ureteral stone, evaluate for progression.  EXAM: ABDOMEN - 1 VIEW  COMPARISON:  CT 10/07/2017  FINDINGS: Previous distal ureteral calculus is tentatively but not definitively identified overlying the sacrum in the left pelvis. Patient's known bilateral renal calculi are not well visualized. Few scattered air-fluid levels within bowel loops in the right abdomen suggest ileus. Moderate colonic stool burden. Cholecystectomy  clips in the right upper quadrant. Left lung base subsegmental atelectasis.  IMPRESSION: 1. Left distal ureteral calculus is tentatively identified overlying the sacrum, similar in location to prior CT. 2. Known bilateral nonobstructing renal calculi are not well seen radiographically. 3. Few air-fluid levels within bowel loops in the right abdomen suggests ileus.   Electronically Signed   By: Rubye Oaks M.D.   On: 10/10/2017 05:01     Results for orders placed during the hospital encounter of 10/06/17  CT Renal Stone Study   Narrative CLINICAL DATA:  Left flank pain radiating to the left groin today. Previous history of kidney stones. Prior cholecystectomy.  EXAM: CT ABDOMEN AND PELVIS WITHOUT CONTRAST  TECHNIQUE: Multidetector CT imaging of the abdomen and pelvis was performed following the standard protocol without IV contrast.  COMPARISON:  12/28/2016  FINDINGS: Lower chest: Atelectasis in the lung bases.  Hepatobiliary: Mild diffuse fatty infiltration of the liver with somewhat geographic pattern. No definite focal liver lesions. Appearance is similar to previous study. Surgical absence of the gallbladder. No bile duct dilatation.  Pancreas: Unremarkable. No pancreatic ductal dilatation or  surrounding inflammatory changes.  Spleen: Normal in size without focal abnormality.  Adrenals/Urinary Tract: No adrenal gland nodules. Bilateral renal cysts. Bilateral intrarenal stones, more numerous on the left. Largest stone is on the left and measures about 4 mm in diameter. There is a 6 mm stone in the distal left ureter just above the ureterovesical junction. Left hydronephrosis and hydroureter with stranding around the left kidney and ureter. No ureteral stone or obstruction on the right. Bladder wall is not thickened. No bladder stones.  Stomach/Bowel: Diverticulosis of the colon. No evidence of diverticulitis. Stomach, small bowel, and colon are not  abnormally distended. Appendix is normal.  Vascular/Lymphatic: No significant vascular findings are present. No enlarged abdominal or pelvic lymph nodes.  Reproductive: Prostate gland is enlarged, measuring 5.1 cm diameter.  Other: Small left inguinal hernia containing fat. No free air or free fluid in the abdomen.  Musculoskeletal: Mild degenerative changes in the spine. No destructive bone lesions.  IMPRESSION: 1. 6 mm stone in the distal left ureter with moderate proximal obstruction. 2. Multiple bilateral nonobstructing intrarenal stones. 3. Diverticulosis of the colon.  No evidence of diverticulitis. 4. Diffuse fatty infiltration of the liver. 5. Enlarged prostate gland. 6. Small left inguinal hernia containing fat.   Electronically Signed   By: Burman Nieves M.D.   On: 10/07/2017 00:34     Assessment & Plan:   57 year old male with left renal colic secondary to a left distal ureteral calculus.  He has failed outpatient management and will admit for pain control.  He had a KUB yesterday and the stone is not well visualized.  Will proceed with ureteroscopic removal on 1/10. The indications and nature of the planned procedure were discussed as well as the potential  benefits and expected outcome.  Alternatives have been discussed in detail. The most common complications and side effects were discussed including but not limited to infection/sepsis; blood loss; damage to urethra, bladder, ureter; need for multiple surgeries; need for prolonged stent placement as well as general anesthesia risks. Although uncommon he was also informed of the possibility that the calculus may not be able to be treated due to inability to obtain access to the ureter. In that event he would require stent placement and a follow-up procedure after a period of stent dilation. All of his questions were answered and he desires to proceed.     Riki Altes, MD  [facility] [address] [place], [place] [address] [phone] 10/11/2017

## 2017-10-11 NOTE — H&P (View-Only) (Signed)
9:05 AM   John [name] [birth year] [id number]  Referring provider: Jaclyn Shaggyate, Denny C, MD [address]   [place], [place] [id number]  Chief Complaint  Patient presents with  . Nephrolithiasis    HPI: 57 year old male presents for evaluation of left renal colic.  He has a long history of recurrent stone disease.  He presented to the ED on 10/06/2016 with left renal colic.  A CT showed a 6 mm left distal ureteral calculus and bilateral renal calculi.  He was subsequently admitted for pain control and was seen by Dr. Apolinar JunesBrandon on 1/5 and his pain had resolved at the time of that visit.  He was discharged that day however return to the ED yesterday with recurrent renal colic.  His pain was again controlled however this morning he had acute onset of left flank pain.  There were no identifiable precipitating, aggravating or alleviating factors.  He denies nausea, vomiting, fever or chills.   PMH: Past Medical History:  Diagnosis Date  . History of kidney stones   . Hyperlipidemia     Surgical History: Past Surgical History:  Procedure Laterality Date  . CHOLECYSTECTOMY    . SHOULDER ARTHROSCOPY WITH SUBACROMIAL DECOMPRESSION Right 10/27/2016   Procedure: SHOULDER ARTHROSCOPY WITH SUBACROMIAL DECOMPRESSION  AND DEBRIDEMENT;  Surgeon: Christena FlakeJohn J Poggi, MD;  Location: [facility] ORS;  Service: Orthopedics;  Laterality: Right;  . SHOULDER SURGERY Right 2003    Home Medications:  Allergies as of 10/11/2017   No Known Allergies     Medication List        Accurate as of 10/11/17  9:05 AM. Always use your most recent med list.          atorvastatin 10 MG tablet Commonly known as:  LIPITOR Take 10 mg by mouth daily.   cyclobenzaprine 10 MG tablet Commonly known as:  FLEXERIL Take 10 mg by mouth daily as needed.   losartan 25 MG tablet Commonly known as:  COZAAR Take 1 tablet by mouth daily.   ondansetron 4 MG disintegrating tablet Commonly known as:  ZOFRAN ODT Allow 1-2 tablets to dissolve  in your mouth every 8 hours as needed for nausea/vomiting   oxyCODONE-acetaminophen 5-325 MG tablet Commonly known as:  ROXICET Take 1-2 tablets by mouth every 4 (four) hours as needed for severe pain.   tamsulosin 0.4 MG Caps capsule Commonly known as:  FLOMAX Take 1 tablet by mouth daily until you pass the kidney stone or no longer have symptoms       Allergies: No Known Allergies  Family History: Family History  Problem Relation Age of Onset  . Heart disease Father   . Prostate cancer Neg Hx   . Bladder Cancer Neg Hx   . Kidney cancer Neg Hx     Social History:  reports that  has never smoked. His smokeless tobacco use includes snuff. He reports that he does not drink alcohol or use drugs.  ROS: UROLOGY Frequent Urination?: No Hard to postpone urination?: No Burning/pain with urination?: No Get up at night to urinate?: No Leakage of urine?: No Urine stream starts and stops?: No Trouble starting stream?: No Do you have to strain to urinate?: No Blood in urine?: No Urinary tract infection?: No Sexually transmitted disease?: No Injury to kidneys or bladder?: No Painful intercourse?: No Weak stream?: No Erection problems?: No Penile pain?: No  Gastrointestinal Nausea?: No Vomiting?: No Indigestion/heartburn?: No Diarrhea?: No Constipation?: No  Constitutional Fever: No Night sweats?: No Weight  loss?: No Fatigue?: No  Skin Skin rash/lesions?: No Itching?: No  Eyes Blurred vision?: No Double vision?: No  Ears/Nose/Throat Sore throat?: No Sinus problems?: No  Hematologic/Lymphatic Swollen glands?: No Easy bruising?: No  Cardiovascular Leg swelling?: No Chest pain?: No  Respiratory Cough?: No Shortness of breath?: No  Endocrine Excessive thirst?: No  Musculoskeletal Back pain?: No Joint pain?: No  Neurological Headaches?: No Dizziness?: No  Psychologic Depression?: No Anxiety?: No  Physical Exam: BP (!) 146/83 (BP Location:  Left Arm, Patient Position: Sitting, Cuff Size: Normal)   Pulse 92   Ht 6\' 1"  (1.854 m)   Wt 227 lb (103 kg)   BMI 29.95 kg/m   Constitutional:  Alert and oriented, No acute distress. HEENT: [place] AT, moist mucus membranes.  Trachea midline, no masses. Cardiovascular: No clubbing, cyanosis, or edema. CV RRR Respiratory: Normal respiratory effort, no increased work of breathing.  Lungs clear GI: Abdomen is soft, nontender, nondistended, no abdominal masses GU: Mild left CVA tenderness.  Skin: No rashes, bruises or suspicious lesions. Lymph: No cervical or inguinal adenopathy. Neurologic: Grossly intact, no focal deficits, moving all 4 extremities. Psychiatric: Normal mood and affect.  Laboratory Data: Lab Results  Component Value Date   WBC 9.3 10/10/2017   HGB 15.6 10/10/2017   HCT 46.0 10/10/2017   MCV 89.0 10/10/2017   PLT 250 10/10/2017    Lab Results  Component Value Date   CREATININE 1.50 (H) 10/10/2017    Urinalysis Lab Results  Component Value Date   APPEARANCEUR CLEAR (A) 10/10/2017   LEUKOCYTESUR NEGATIVE 10/10/2017   PROTEINUR NEGATIVE 10/10/2017   GLUCOSEU NEGATIVE 10/10/2017   RBCU 0-5 10/10/2017   BILIRUBINUR NEGATIVE 10/10/2017   NITRITE NEGATIVE 10/10/2017    Lab Results  Component Value Date   BACTERIA NONE SEEN 10/10/2017    Pertinent Imaging: CT/KUB reviewed Results for orders placed during the hospital encounter of 10/10/17  DG Abdomen 1 View   Narrative CLINICAL DATA:  Increasing left flank pain. Recent diagnosis of ureteral stone, evaluate for progression.  EXAM: ABDOMEN - 1 VIEW  COMPARISON:  CT 10/07/2017  FINDINGS: Previous distal ureteral calculus is tentatively but not definitively identified overlying the sacrum in the left pelvis. Patient's known bilateral renal calculi are not well visualized. Few scattered air-fluid levels within bowel loops in the right abdomen suggest ileus. Moderate colonic stool burden. Cholecystectomy  clips in the right upper quadrant. Left lung base subsegmental atelectasis.  IMPRESSION: 1. Left distal ureteral calculus is tentatively identified overlying the sacrum, similar in location to prior CT. 2. Known bilateral nonobstructing renal calculi are not well seen radiographically. 3. Few air-fluid levels within bowel loops in the right abdomen suggests ileus.   Electronically Signed   By: Rubye Oaks M.D.   On: 10/10/2017 05:01     Results for orders placed during the hospital encounter of 10/06/17  CT Renal Stone Study   Narrative CLINICAL DATA:  Left flank pain radiating to the left groin today. Previous history of kidney stones. Prior cholecystectomy.  EXAM: CT ABDOMEN AND PELVIS WITHOUT CONTRAST  TECHNIQUE: Multidetector CT imaging of the abdomen and pelvis was performed following the standard protocol without IV contrast.  COMPARISON:  12/28/2016  FINDINGS: Lower chest: Atelectasis in the lung bases.  Hepatobiliary: Mild diffuse fatty infiltration of the liver with somewhat geographic pattern. No definite focal liver lesions. Appearance is similar to previous study. Surgical absence of the gallbladder. No bile duct dilatation.  Pancreas: Unremarkable. No pancreatic ductal dilatation or  surrounding inflammatory changes.  Spleen: Normal in size without focal abnormality.  Adrenals/Urinary Tract: No adrenal gland nodules. Bilateral renal cysts. Bilateral intrarenal stones, more numerous on the left. Largest stone is on the left and measures about 4 mm in diameter. There is a 6 mm stone in the distal left ureter just above the ureterovesical junction. Left hydronephrosis and hydroureter with stranding around the left kidney and ureter. No ureteral stone or obstruction on the right. Bladder wall is not thickened. No bladder stones.  Stomach/Bowel: Diverticulosis of the colon. No evidence of diverticulitis. Stomach, small bowel, and colon are not  abnormally distended. Appendix is normal.  Vascular/Lymphatic: No significant vascular findings are present. No enlarged abdominal or pelvic lymph nodes.  Reproductive: Prostate gland is enlarged, measuring 5.1 cm diameter.  Other: Small left inguinal hernia containing fat. No free air or free fluid in the abdomen.  Musculoskeletal: Mild degenerative changes in the spine. No destructive bone lesions.  IMPRESSION: 1. 6 mm stone in the distal left ureter with moderate proximal obstruction. 2. Multiple bilateral nonobstructing intrarenal stones. 3. Diverticulosis of the colon.  No evidence of diverticulitis. 4. Diffuse fatty infiltration of the liver. 5. Enlarged prostate gland. 6. Small left inguinal hernia containing fat.   Electronically Signed   By: Burman Nieves M.D.   On: 10/07/2017 00:34     Assessment & Plan:   57 year old male with left renal colic secondary to a left distal ureteral calculus.  He has failed outpatient management and will admit for pain control.  He had a KUB yesterday and the stone is not well visualized.  Will proceed with ureteroscopic removal on 1/10. The indications and nature of the planned procedure were discussed as well as the potential  benefits and expected outcome.  Alternatives have been discussed in detail. The most common complications and side effects were discussed including but not limited to infection/sepsis; blood loss; damage to urethra, bladder, ureter; need for multiple surgeries; need for prolonged stent placement as well as general anesthesia risks. Although uncommon he was also informed of the possibility that the calculus may not be able to be treated due to inability to obtain access to the ureter. In that event he would require stent placement and a follow-up procedure after a period of stent dilation. All of his questions were answered and he desires to proceed.     Riki Altes, MD  [facility] [address] [place], [place] [address] [phone] 10/11/2017

## 2017-10-11 NOTE — Care Management (Signed)
Patient with recent discharge from inpatient admission 10/07/2016 for ureteral colic. Patient was pain free at discharge so was thought the stone had passed into the bladder.  Presented back to the ED with similar pain. Attempted to see urologist but schedules were full.  Placed in observation for intractable pain

## 2017-10-12 ENCOUNTER — Observation Stay: Payer: BLUE CROSS/BLUE SHIELD | Admitting: Anesthesiology

## 2017-10-12 ENCOUNTER — Telehealth: Payer: Self-pay | Admitting: Radiology

## 2017-10-12 ENCOUNTER — Encounter
Admission: AD | Disposition: A | Discharge status: Home / Self Care | Payer: Self-pay | Source: Ambulatory Visit | Attending: Urology

## 2017-10-12 ENCOUNTER — Encounter: Payer: Self-pay | Admitting: *Deleted

## 2017-10-12 DIAGNOSIS — N202 Calculus of kidney with calculus of ureter: Secondary | ICD-10-CM | POA: Diagnosis not present

## 2017-10-12 DIAGNOSIS — N201 Calculus of ureter: Secondary | ICD-10-CM | POA: Diagnosis not present

## 2017-10-12 HISTORY — PX: CYSTOSCOPY/URETEROSCOPY/HOLMIUM LASER/STENT PLACEMENT: SHX6546

## 2017-10-12 SURGERY — CYSTOSCOPY/URETEROSCOPY/HOLMIUM LASER/STENT PLACEMENT
Anesthesia: General | Site: Ureter | Laterality: Left | Wound class: Clean Contaminated

## 2017-10-12 MED ORDER — MIDAZOLAM HCL 2 MG/2ML IJ SOLN
INTRAMUSCULAR | Status: AC
  Filled 2017-10-12: qty 2

## 2017-10-12 MED ORDER — HYDROMORPHONE HCL 1 MG/ML IJ SOLN
INTRAMUSCULAR | Status: DC | PRN
  Administered 2017-10-12: .4 mg via INTRAVENOUS

## 2017-10-12 MED ORDER — FENTANYL CITRATE (PF) 100 MCG/2ML IJ SOLN
INTRAMUSCULAR | Status: DC | PRN
  Administered 2017-10-12: 25 ug via INTRAVENOUS

## 2017-10-12 MED ORDER — FENTANYL CITRATE (PF) 100 MCG/2ML IJ SOLN
INTRAMUSCULAR | Status: AC
  Filled 2017-10-12: qty 2

## 2017-10-12 MED ORDER — ONDANSETRON HCL 4 MG/2ML IJ SOLN: 4.0000 mg | Freq: Once | INTRAMUSCULAR | Status: DC | PRN

## 2017-10-12 MED ORDER — HYDROMORPHONE HCL 1 MG/ML IJ SOLN
INTRAMUSCULAR | Status: AC
Start: 2017-10-12 — End: 2017-10-12
  Filled 2017-10-12: qty 1

## 2017-10-12 MED ORDER — FENTANYL CITRATE (PF) 100 MCG/2ML IJ SOLN: 25.0000 ug | INTRAMUSCULAR | Status: DC | PRN

## 2017-10-12 MED ORDER — MIDAZOLAM HCL 2 MG/2ML IJ SOLN
INTRAMUSCULAR | Status: DC | PRN
  Administered 2017-10-12: 2 mg via INTRAVENOUS

## 2017-10-12 MED ORDER — PROPOFOL 10 MG/ML IV BOLUS
INTRAVENOUS | Status: DC | PRN
  Administered 2017-10-12: 160 mg via INTRAVENOUS

## 2017-10-12 MED ORDER — CEFAZOLIN SODIUM-DEXTROSE 2-4 GM/100ML-% IV SOLN
INTRAVENOUS | Status: AC
  Filled 2017-10-12: qty 100

## 2017-10-12 MED ORDER — LACTATED RINGERS IV SOLN
INTRAVENOUS | Status: DC | PRN
  Administered 2017-10-12: 10:00:00 via INTRAVENOUS

## 2017-10-12 MED ORDER — IOTHALAMATE MEGLUMINE 43 % IV SOLN
INTRAVENOUS | Status: DC | PRN
  Administered 2017-10-12: 15 mL via URETHRAL

## 2017-10-12 SURGICAL SUPPLY — 28 items
BAG DRAIN CYSTO-URO LG1000N (MISCELLANEOUS) ×2 IMPLANT
BASKET ZERO TIP 1.9FR (BASKET) ×2 IMPLANT
BRUSH SCRUB EZ 1% IODOPHOR (MISCELLANEOUS) ×2 IMPLANT
CATH URETL 5X70 OPEN END (CATHETERS) ×2 IMPLANT
CNTNR SPEC 2.5X3XGRAD LEK (MISCELLANEOUS)
CONRAY 43 FOR UROLOGY 50M (MISCELLANEOUS) ×2 IMPLANT
CONT SPEC 4OZ STER OR WHT (MISCELLANEOUS)
CONTAINER SPEC 2.5X3XGRAD LEK (MISCELLANEOUS) IMPLANT
DRAPE UTILITY 15X26 TOWEL STRL (DRAPES) ×2 IMPLANT
FIBER LASER LITHO 273 (Laser) ×2 IMPLANT
GLOVE BIO SURGEON STRL SZ8 (GLOVE) ×2 IMPLANT
GOWN STRL REUS W/ TWL LRG LVL3 (GOWN DISPOSABLE) ×2 IMPLANT
GOWN STRL REUS W/TWL LRG LVL3 (GOWN DISPOSABLE) ×2
GUIDEWIRE GREEN .038 145CM (MISCELLANEOUS) IMPLANT
INFUSOR MANOMETER BAG 3000ML (MISCELLANEOUS) ×2 IMPLANT
INTRODUCER DILATOR DOUBLE (INTRODUCER) IMPLANT
KIT RM TURNOVER CYSTO AR (KITS) ×2 IMPLANT
PACK CYSTO AR (MISCELLANEOUS) ×2 IMPLANT
SENSORWIRE 0.038 NOT ANGLED (WIRE) ×2
SET CYSTO W/LG BORE CLAMP LF (SET/KITS/TRAYS/PACK) ×2 IMPLANT
SHEATH URETERAL 12FRX35CM (MISCELLANEOUS) IMPLANT
SOL .9 NS 3000ML IRR  AL (IV SOLUTION) ×1
SOL .9 NS 3000ML IRR UROMATIC (IV SOLUTION) ×1 IMPLANT
STENT URET 6FRX24 CONTOUR (STENTS) IMPLANT
STENT URET 6FRX26 CONTOUR (STENTS) ×2 IMPLANT
SURGILUBE 2OZ TUBE FLIPTOP (MISCELLANEOUS) ×2 IMPLANT
WATER STERILE IRR 1000ML POUR (IV SOLUTION) ×2 IMPLANT
WIRE SENSOR 0.038 NOT ANGLED (WIRE) ×1 IMPLANT

## 2017-10-12 NOTE — Transfer of Care (Signed)
Immediate Anesthesia Transfer of Care Note  Patient: John MillionFred A Grippi  Procedure(s) Performed: CYSTOSCOPY/URETEROSCOPY/HOLMIUM LASER/STENT PLACEMENT (Left Ureter)  Patient Location: PACU  Anesthesia Type:General  Level of Consciousness: awake, alert  and oriented  Airway & Oxygen Therapy: Patient Spontanous Breathing  Post-op Assessment: Report given to RN  Post vital signs: stable  Last Vitals:  Vitals:   10/12/17 0851 10/12/17 1100  BP: (!) 142/82 120/63  Pulse: 68 77  Resp: 18 20  Temp: (!) 36.3 C 36.7 C  SpO2: 97% 97%    Last Pain:  Vitals:   10/12/17 1100  TempSrc:   PainSc: Asleep         Complications: No apparent anesthesia complications

## 2017-10-12 NOTE — Progress Notes (Signed)
Patient tolerating diet and pain free. Vitals stable. Cleared for discharge. AVS printed. Instructions given and questions answered. IV removed. Patient walked to exit.

## 2017-10-12 NOTE — Anesthesia Post-op Follow-up Note (Signed)
Anesthesia QCDR form completed.        

## 2017-10-12 NOTE — Anesthesia Preprocedure Evaluation (Signed)
Anesthesia Evaluation  Patient identified by MRN, date of birth, ID band Patient awake    Reviewed: Allergy & Precautions, H&P , NPO status , Patient's Chart, lab work & pertinent test results, reviewed documented beta blocker date and time   Airway Mallampati: II  TM Distance: >3 FB Neck ROM: full    Dental  (+) Teeth Intact   Pulmonary neg pulmonary ROS,    Pulmonary exam normal        Cardiovascular Exercise Tolerance: Good negative cardio ROS Normal cardiovascular exam Rate:Normal     Neuro/Psych negative neurological ROS  negative psych ROS   GI/Hepatic negative GI ROS, Neg liver ROS,   Endo/Other  negative endocrine ROS  Renal/GU Renal diseasenegative Renal ROS  negative genitourinary   Musculoskeletal   Abdominal   Peds  Hematology negative hematology ROS (+)   Anesthesia Other Findings   Reproductive/Obstetrics negative OB ROS                             Anesthesia Physical Anesthesia Plan  ASA: II  Anesthesia Plan: General LMA   Post-op Pain Management:    Induction:   PONV Risk Score and Plan: 3  Airway Management Planned:   Additional Equipment:   Intra-op Plan:   Post-operative Plan:   Informed Consent: I have reviewed the patients History and Physical, chart, labs and discussed the procedure including the risks, benefits and alternatives for the proposed anesthesia with the patient or authorized representative who has indicated his/her understanding and acceptance.     Plan Discussed with: CRNA  Anesthesia Plan Comments:         Anesthesia Quick Evaluation

## 2017-10-12 NOTE — Anesthesia Procedure Notes (Signed)
Procedure Name: LMA Insertion Date/Time: 10/12/2017 10:09 AM Performed by: Hermenia BersNewman, Dawnetta Copenhaver W, CRNA Pre-anesthesia Checklist: Emergency Drugs available, Patient identified, Suction available and Patient being monitored Patient Re-evaluated:Patient Re-evaluated prior to induction Oxygen Delivery Method: Circle system utilized Preoxygenation: Pre-oxygenation with 100% oxygen Induction Type: IV induction Ventilation: Mask ventilation without difficulty LMA: LMA inserted LMA Size: 4.5 Nasal Tubes: Right

## 2017-10-12 NOTE — Telephone Encounter (Signed)
done

## 2017-10-12 NOTE — Op Note (Signed)
Preoperative diagnosis: Left distal ureteral calculus  Postoperative diagnosis: Left distal ureteral calculus  Procedure:  1. Cystoscopy 2. Left ureteroscopy and stone removal 3. Ureteroscopic laser lithotripsy 4. Left ureteral stent placement  5. Left retrograde pyelography with interpretation  Surgeon: Lorin PicketScott C. Jamieka Royle, M.D.  Anesthesia: General  Complications: None  Intraoperative findings: Left retrograde pyelography post procedure demonstrated no filling defects, obstruction or contrast extravasation  EBL: Minimal  Specimens: 1. Calculus fragments for analysis   Indication: Farrel DemarkFred A [name] is a 57 y.o. year old patient with a 6 mm left distal ureteral calculus refractory to outpatient management.  He was admitted yesterday for pain control. After reviewing the management options for treatment, the patient elected to proceed with the above surgical procedure(s). We have discussed the potential benefits and risks of the procedure, side effects of the proposed treatment, the likelihood of the patient achieving the goals of the procedure, and any potential problems that might occur during the procedure or recuperation. Informed consent has been obtained.  Description of procedure:  The patient was taken to the operating room and general anesthesia was induced.  The patient was placed in the dorsal lithotomy position, prepped and draped in the usual sterile fashion, and preoperative antibiotics were administered. A preoperative time-out was performed.   Cystourethroscopy was performed.  The patient's urethra was examined and was normal.  The prostate demonstrated mild bilobar prostatic enlargement. The bladder was then systematically examined in its entirety. There was no evidence for any bladder tumors or other mucosal pathology.  Particulate stone particles were noted in the bladder.  Attention then turned to the left ureteral orifice and a 0.038 sensor guidewire was then advanced up  the left ureter into the renal pelvis under fluoroscopic guidance.  The cystoscope was removed and a 6 Fr semirigid ureteroscope was passed per urethra and advanced into the ureter next to the guidewire and the calculus was identified.   The stone was then fragmented with the 273 micron holmium laser fiber on a setting of 0.8 J and frequency of 8 hz.   All stones were then removed from the ureter with a zero tip nitinol basket.  Reinspection of the ureter revealed no remaining visible stones or fragments.   The ureteroscope was removed.  A 6 French/26 cm double-J ureteral stent was placed over the wire under fluoroscopic guidance.  There was a good curl noted in the bladder and renal pelvis under fluoroscopy.  The cystoscope was repassed and stone fragments were irrigated from the bladder.  The bladder was then emptied and the cystoscope was removed the patient appeared to tolerate the procedure well and without complications.  After anesthetic reversal he was transported to the PACU in stable condition.

## 2017-10-12 NOTE — Interval H&P Note (Signed)
History and Physical Interval Note:  10/12/2017 9:47 AM  John [name]  has presented today for surgery, with the diagnosis of left ureteral stone  The various methods of treatment have been discussed with the patient and family. After consideration of risks, benefits and other options for treatment, the patient has consented to  Procedure(s): CYSTOSCOPY/URETEROSCOPY/HOLMIUM LASER/STENT PLACEMENT (Left) as a surgical intervention .  The patient's history has been reviewed, patient examined, no change in status, stable for surgery.  I have reviewed the patient's chart and labs.  Questions were answered to the patient's satisfaction.     [name] C Ezel Vallone

## 2017-10-12 NOTE — Telephone Encounter (Signed)
-----   Message from Riki AltesScott C Stoioff, MD sent at 10/12/2017 12:51 PM EST ----- Please schedule postop follow-up appointment with me in 3-4 weeks

## 2017-10-13 ENCOUNTER — Encounter: Payer: Self-pay | Admitting: Urology

## 2017-10-15 NOTE — Anesthesia Postprocedure Evaluation (Signed)
Anesthesia Post Note  Patient: Faylene MillionFred A Snowball  Procedure(s) Performed: CYSTOSCOPY/URETEROSCOPY/HOLMIUM LASER/STENT PLACEMENT (Left Ureter)  Patient location during evaluation: PACU Anesthesia Type: General Level of consciousness: awake and alert Pain management: pain level controlled Vital Signs Assessment: post-procedure vital signs reviewed and stable Respiratory status: spontaneous breathing, nonlabored ventilation, respiratory function stable and patient connected to nasal cannula oxygen Cardiovascular status: blood pressure returned to baseline and stable Postop Assessment: no apparent nausea or vomiting Anesthetic complications: no     Last Vitals:  Vitals:   10/12/17 1130 10/12/17 1145  BP: 122/77 116/74  Pulse: 60 64  Resp: 11 11  Temp:  36.6 C  SpO2: 96% 94%    Last Pain:  Vitals:   10/12/17 1100  TempSrc:   PainSc: Asleep                 Yevette EdwardsJames G Adams

## 2017-10-18 LAB — STONE ANALYSIS
CA OXALATE, MONOHYDR.: 95 %
CA PHOS CRY STONE QL IR: 5 %
Stone Weight KSTONE: 28.5 mg

## 2017-10-18 NOTE — Discharge Summary (Signed)
Date of admission: 10/11/2017  Date of discharge: 10/18/2017  Admission diagnosis: Left renal colic  Discharge diagnosis: Left renal colic  Secondary diagnoses:  Patient Active Problem List   Diagnosis Date Noted  . Renal colic on left side 93/90/3009  . Kidney stone 10/07/2017    History and Physical: For full details, please see admission history and physical. Briefly, John [name] is a 57 y.o. year old patient with left renal colic secondary to a 6 mm left distal ureteral calculus.  He failed outpatient management and was admitted for pain control and ureteroscopy.  Hospital Course: Patient tolerated the procedure well.  He was then transferred to the floor after an uneventful PACU stay.  His hospital course was uncomplicated.  On POD#1 he had met discharge criteria: was eating a regular diet, was up and ambulating independently,  pain was well controlled, was voiding without a catheter, and was ready to for discharge.   Laboratory values:  No results for input(s): WBC, HGB, HCT in the last 72 hours. No results for input(s): NA, K, CL, CO2, GLUCOSE, BUN, CREATININE, CALCIUM in the last 72 hours. No results for input(s): LABPT, INR in the last 72 hours. No results for input(s): LABURIN in the last 72 hours. Results for orders placed or performed during the hospital encounter of 10/11/17  Stone analysis     Status: None   Collection Time: 10/12/17 10:31 AM  Result Value Ref Range Status   Color Owens Shark  Final   Size 4x4x2 mm Final   Stone Weight KSTONE 28.5 mg Corrected   Nidus No Nidus visualized  Corrected   Ca Oxalate,Monohydr. 95 % Corrected   Ca phos cry stone ql IR 5 % Corrected   Composition Comment  Corrected    Comment: Percentage (Represents the % composition)   Photo Comment  Corrected    Comment: Photograph will follow under separate cover.   Comment: Comment  Corrected    Comment: (NOTE) Physician questions regarding Calculi Analysis contact [facility]  at: [phone].    PLEASE NOTE: Comment  Corrected    Comment: (NOTE) Calculi report with photograph will follow via computer, mail or courier delivery.    Disclaimer - Kidney Stone Analysis: Comment  Corrected    Comment: (NOTE) This test was developed and its performance characteristics determined by [facility]. It has not been cleared or approved by the Food and Drug Administration. Performed At: [facility] [place], [place] [id number] Rush Farmer MD [id number] Performed at [facility], [address] [place], [place] [address]     Disposition: Home  Discharge instruction: The patient was instructed to be ambulatory but told to refrain from heavy lifting, strenuous activity, or driving.   Discharge medications:  Allergies as of 10/12/2017   No Known Allergies     Medication List    TAKE these medications   atorvastatin 10 MG tablet Commonly known as:  LIPITOR Take 10 mg by mouth daily.   cyclobenzaprine 10 MG tablet Commonly known as:  FLEXERIL Take 10 mg by mouth daily as needed.   losartan 25 MG tablet Commonly known as:  COZAAR Take 1 tablet by mouth daily.   ondansetron 4 MG disintegrating tablet Commonly known as:  ZOFRAN ODT Allow 1-2 tablets to dissolve in your mouth every 8 hours as needed for nausea/vomiting   oxyCODONE-acetaminophen 5-325 MG tablet Commonly known as:  ROXICET Take 1-2 tablets by mouth every 4 (four) hours as needed for severe pain.  tamsulosin 0.4 MG Caps capsule Commonly known as:  FLOMAX Take 1 tablet by mouth daily until you pass the kidney stone or no longer have symptoms       Followup: Follow-up appointment scheduled 11/02/2017

## 2017-11-02 ENCOUNTER — Encounter: Payer: Self-pay | Admitting: Urology

## 2017-11-02 ENCOUNTER — Ambulatory Visit (INDEPENDENT_AMBULATORY_CARE_PROVIDER_SITE_OTHER): Payer: BLUE CROSS/BLUE SHIELD | Admitting: Urology

## 2017-11-02 VITALS — BP 97/60 | HR 91 | Ht 73.0 in | Wt 221.0 lb

## 2017-11-02 DIAGNOSIS — N200 Calculus of kidney: Secondary | ICD-10-CM

## 2017-11-02 NOTE — Progress Notes (Signed)
11/02/2017 9:49 AM   John [name] [birth year] [id number]  Referring provider: Jaclyn Shaggy, MD [address]   [place], [place] [address]  Chief Complaint  Patient presents with  . Nephrolithiasis    3-4wk    HPI: 58 year old male presents for postop follow-up.  He was admitted to the hospital on 10/11/2016 with severe renal colic secondary to a distal ureteral calculus.  Ureteroscopic removal was performed on 10/12/2016 with resolution of his pain.  He removed his stent on 1/14 and had no flank pain.  He has no complaints today.  Stone analysis was 95% calcium oxalate monohydrate.  He has seen Dr. Marcy Salvo at [facility] in 2017 and states he was told to hydrate.  He does have bilateral nonobstructing renal calculi on CT.   PMH: Past Medical History:  Diagnosis Date  . History of kidney stones   . Hyperlipidemia     Surgical History: Past Surgical History:  Procedure Laterality Date  . CHOLECYSTECTOMY    . CYSTOSCOPY/URETEROSCOPY/HOLMIUM LASER/STENT PLACEMENT Left 10/12/2017   Procedure: CYSTOSCOPY/URETEROSCOPY/HOLMIUM LASER/STENT PLACEMENT;  Surgeon: Riki Altes, MD;  Location: [facility] ORS;  Service: Urology;  Laterality: Left;  . SHOULDER ARTHROSCOPY WITH SUBACROMIAL DECOMPRESSION Right 10/27/2016   Procedure: SHOULDER ARTHROSCOPY WITH SUBACROMIAL DECOMPRESSION  AND DEBRIDEMENT;  Surgeon: Christena Flake, MD;  Location: [facility] ORS;  Service: Orthopedics;  Laterality: Right;  . SHOULDER SURGERY Right 2003    Home Medications:  Allergies as of 11/02/2017   No Known Allergies     Medication List        Accurate as of 11/02/17  9:49 AM. Always use your most recent med list.          atorvastatin 10 MG tablet Commonly known as:  LIPITOR Take 10 mg by mouth daily.   cyclobenzaprine 10 MG tablet Commonly known as:  FLEXERIL Take 10 mg by mouth daily as needed.   losartan 25 MG tablet Commonly known as:  COZAAR Take 1 tablet by mouth daily.   tamsulosin 0.4 MG Caps  capsule Commonly known as:  FLOMAX Take 1 tablet by mouth daily until you pass the kidney stone or no longer have symptoms       Allergies: No Known Allergies  Family History: Family History  Problem Relation Age of Onset  . Heart disease Father   . Prostate cancer Neg Hx   . Bladder Cancer Neg Hx   . Kidney cancer Neg Hx     Social History:  reports that  has never smoked. His smokeless tobacco use includes snuff. He reports that he does not drink alcohol or use drugs.  ROS: UROLOGY Frequent Urination?: No Hard to postpone urination?: No Burning/pain with urination?: No Get up at night to urinate?: No Leakage of urine?: No Urine stream starts and stops?: No Trouble starting stream?: No Do you have to strain to urinate?: No Blood in urine?: No Urinary tract infection?: No Sexually transmitted disease?: No Injury to kidneys or bladder?: No Painful intercourse?: No Weak stream?: No Erection problems?: No Penile pain?: No  Gastrointestinal Nausea?: No Vomiting?: No Indigestion/heartburn?: No Diarrhea?: No Constipation?: No  Constitutional Fever: No Night sweats?: No Weight loss?: No Fatigue?: No  Skin Skin rash/lesions?: No Itching?: No  Eyes Blurred vision?: No Double vision?: No  Ears/Nose/Throat Sore throat?: No Sinus problems?: No  Hematologic/Lymphatic Swollen glands?: No Easy bruising?: No  Cardiovascular Leg swelling?: No Chest pain?: No  Respiratory Cough?: No Shortness of breath?: No  Endocrine  Excessive thirst?: No  Musculoskeletal Back pain?: No Joint pain?: No  Neurological Headaches?: No Dizziness?: No  Psychologic Depression?: No Anxiety?: No  Physical Exam: BP 97/60   Pulse 91   Ht 6\' 1"  (1.854 m)   Wt 221 lb (100.2 kg)   BMI 29.16 kg/m   Constitutional:  Alert and oriented, No acute distress. HEENT: [place] AT, moist mucus membranes.  Trachea midline, no masses. Cardiovascular: No clubbing, cyanosis, or  edema. Respiratory: Normal respiratory effort, no increased work of breathing. GI: Abdomen is soft, nontender, nondistended, no abdominal masses GU: No CVA tenderness.  Skin: No rashes, bruises or suspicious lesions. Lymph: No cervical or inguinal adenopathy. Neurologic: Grossly intact, no focal deficits, moving all 4 extremities. Psychiatric: Normal mood and affect.  Laboratory Data: Lab Results  Component Value Date   WBC 9.3 10/10/2017   HGB 15.6 10/10/2017   HCT 46.0 10/10/2017   MCV 89.0 10/10/2017   PLT 250 10/10/2017    Lab Results  Component Value Date   CREATININE 1.50 (H) 10/10/2017    Results for orders placed during the hospital encounter of 10/06/17  CT Renal Stone Study   Narrative CLINICAL DATA:  Left flank pain radiating to the left groin today. Previous history of kidney stones. Prior cholecystectomy.  EXAM: CT ABDOMEN AND PELVIS WITHOUT CONTRAST  TECHNIQUE: Multidetector CT imaging of the abdomen and pelvis was performed following the standard protocol without IV contrast.  COMPARISON:  12/28/2016  FINDINGS: Lower chest: Atelectasis in the lung bases.  Hepatobiliary: Mild diffuse fatty infiltration of the liver with somewhat geographic pattern. No definite focal liver lesions. Appearance is similar to previous study. Surgical absence of the gallbladder. No bile duct dilatation.  Pancreas: Unremarkable. No pancreatic ductal dilatation or surrounding inflammatory changes.  Spleen: Normal in size without focal abnormality.  Adrenals/Urinary Tract: No adrenal gland nodules. Bilateral renal cysts. Bilateral intrarenal stones, more numerous on the left. Largest stone is on the left and measures about 4 mm in diameter. There is a 6 mm stone in the distal left ureter just above the ureterovesical junction. Left hydronephrosis and hydroureter with stranding around the left kidney and ureter. No ureteral stone or obstruction on the right. Bladder wall  is not thickened. No bladder stones.  Stomach/Bowel: Diverticulosis of the colon. No evidence of diverticulitis. Stomach, small bowel, and colon are not abnormally distended. Appendix is normal.  Vascular/Lymphatic: No significant vascular findings are present. No enlarged abdominal or pelvic lymph nodes.  Reproductive: Prostate gland is enlarged, measuring 5.1 cm diameter.  Other: Small left inguinal hernia containing fat. No free air or free fluid in the abdomen.  Musculoskeletal: Mild degenerative changes in the spine. No destructive bone lesions.  IMPRESSION: 1. 6 mm stone in the distal left ureter with moderate proximal obstruction. 2. Multiple bilateral nonobstructing intrarenal stones. 3. Diverticulosis of the colon.  No evidence of diverticulitis. 4. Diffuse fatty infiltration of the liver. 5. Enlarged prostate gland. 6. Small left inguinal hernia containing fat.   Electronically Signed   By: Burman NievesWilliam  Stevens M.D.   On: 10/07/2017 00:34     Assessment & Plan:   Doing well status post ureteroscopic stone removal.  He has a long history of recurrent stones.  His last metabolic evaluation was in 2017 and will repeat.  He will be notified with results.  Return in about 6 months (around 05/02/2018) for KUB, Recheck.    Riki AltesScott C Stoioff, MD  [facility] [address], [address] [place],  [place] [address] [phone]

## 2017-11-13 ENCOUNTER — Other Ambulatory Visit: Payer: BLUE CROSS/BLUE SHIELD

## 2017-12-28 DIAGNOSIS — N183 Chronic kidney disease, stage 3 unspecified: Secondary | ICD-10-CM | POA: Insufficient documentation

## 2017-12-28 DIAGNOSIS — E782 Mixed hyperlipidemia: Secondary | ICD-10-CM | POA: Insufficient documentation

## 2017-12-28 DIAGNOSIS — Z6830 Body mass index (BMI) 30.0-30.9, adult: Secondary | ICD-10-CM | POA: Insufficient documentation

## 2017-12-28 DIAGNOSIS — Z720 Tobacco use: Secondary | ICD-10-CM | POA: Insufficient documentation

## 2017-12-28 DIAGNOSIS — I10 Essential (primary) hypertension: Secondary | ICD-10-CM | POA: Insufficient documentation

## 2018-01-01 DIAGNOSIS — M5136 Other intervertebral disc degeneration, lumbar region: Secondary | ICD-10-CM | POA: Insufficient documentation

## 2018-01-01 DIAGNOSIS — M5106 Intervertebral disc disorders with myelopathy, lumbar region: Secondary | ICD-10-CM | POA: Insufficient documentation

## 2018-01-01 DIAGNOSIS — M51369 Other intervertebral disc degeneration, lumbar region without mention of lumbar back pain or lower extremity pain: Secondary | ICD-10-CM | POA: Insufficient documentation

## 2018-01-01 DIAGNOSIS — M961 Postlaminectomy syndrome, not elsewhere classified: Secondary | ICD-10-CM | POA: Insufficient documentation

## 2018-01-01 DIAGNOSIS — M48062 Spinal stenosis, lumbar region with neurogenic claudication: Secondary | ICD-10-CM

## 2018-01-01 DIAGNOSIS — M5431 Sciatica, right side: Secondary | ICD-10-CM | POA: Insufficient documentation

## 2018-02-24 ENCOUNTER — Other Ambulatory Visit: Payer: Self-pay

## 2018-02-24 ENCOUNTER — Emergency Department: Payer: BLUE CROSS/BLUE SHIELD

## 2018-02-24 ENCOUNTER — Emergency Department
Admission: EM | Admit: 2018-02-24 | Discharge: 2018-02-24 | Disposition: A | Discharge status: Home / Self Care | Payer: BLUE CROSS/BLUE SHIELD | Attending: Emergency Medicine | Admitting: Emergency Medicine

## 2018-02-24 DIAGNOSIS — N200 Calculus of kidney: Secondary | ICD-10-CM | POA: Diagnosis not present

## 2018-02-24 DIAGNOSIS — R109 Unspecified abdominal pain: Secondary | ICD-10-CM | POA: Diagnosis present

## 2018-02-24 DIAGNOSIS — R112 Nausea with vomiting, unspecified: Secondary | ICD-10-CM | POA: Diagnosis not present

## 2018-02-24 DIAGNOSIS — Z79899 Other long term (current) drug therapy: Secondary | ICD-10-CM | POA: Insufficient documentation

## 2018-02-24 DIAGNOSIS — F17228 Nicotine dependence, chewing tobacco, with other nicotine-induced disorders: Secondary | ICD-10-CM | POA: Insufficient documentation

## 2018-02-24 LAB — COMPREHENSIVE METABOLIC PANEL
ALK PHOS: 69 U/L (ref 38–126)
ALT: 29 U/L (ref 17–63)
AST: 29 U/L (ref 15–41)
Albumin: 4.1 g/dL (ref 3.5–5.0)
Anion gap: 8 (ref 5–15)
BILIRUBIN TOTAL: 2 mg/dL — AB (ref 0.3–1.2)
BUN: 15 mg/dL (ref 6–20)
CALCIUM: 8.8 mg/dL — AB (ref 8.9–10.3)
CO2: 24 mmol/L (ref 22–32)
CREATININE: 1.97 mg/dL — AB (ref 0.61–1.24)
Chloride: 106 mmol/L (ref 101–111)
GFR, EST AFRICAN AMERICAN: 42 mL/min — AB (ref >60)
GFR, EST NON AFRICAN AMERICAN: 36 mL/min — AB (ref >60)
Glucose, Bld: 128 mg/dL — ABNORMAL HIGH (ref 65–99)
Potassium: 4.2 mmol/L (ref 3.5–5.1)
Sodium: 138 mmol/L (ref 135–145)
TOTAL PROTEIN: 7.4 g/dL (ref 6.5–8.1)

## 2018-02-24 LAB — CBC WITH DIFFERENTIAL/PLATELET
Basophils Absolute: 0 10*3/uL (ref 0–0.1)
Basophils Relative: 0 %
EOS PCT: 1 %
Eosinophils Absolute: 0.1 10*3/uL (ref 0–0.7)
HEMATOCRIT: 48 % (ref 40.0–52.0)
Hemoglobin: 16.2 g/dL (ref 13.0–18.0)
Lymphocytes Relative: 7 %
Lymphs Abs: 1.2 10*3/uL (ref 1.0–3.6)
MCH: 29.6 pg (ref 26.0–34.0)
MCHC: 33.9 g/dL (ref 32.0–36.0)
MCV: 87.5 fL (ref 80.0–100.0)
MONO ABS: 1.3 10*3/uL — AB (ref 0.2–1.0)
MONOS PCT: 8 %
NEUTROS PCT: 84 %
Neutro Abs: 13.6 10*3/uL — ABNORMAL HIGH (ref 1.4–6.5)
PLATELETS: 254 10*3/uL (ref 150–440)
RBC: 5.48 MIL/uL (ref 4.40–5.90)
RDW: 14.8 % — AB (ref 11.5–14.5)
WBC: 16.3 10*3/uL — AB (ref 3.8–10.6)

## 2018-02-24 MED ORDER — MORPHINE SULFATE (PF) 4 MG/ML IV SOLN
8.0000 mg | Freq: Once | INTRAVENOUS | Status: AC
  Administered 2018-02-24: 8 mg via INTRAVENOUS
  Filled 2018-02-24: qty 2

## 2018-02-24 MED ORDER — ONDANSETRON HCL 4 MG/2ML IJ SOLN
4.0000 mg | Freq: Once | INTRAMUSCULAR | Status: AC
Start: 2018-02-24 — End: 2018-02-24
  Administered 2018-02-24: 4 mg via INTRAVENOUS
  Filled 2018-02-24: qty 2

## 2018-02-24 MED ORDER — OXYCODONE-ACETAMINOPHEN 5-325 MG PO TABS: 1.0000 | ORAL_TABLET | Freq: Four times a day (QID) | ORAL | 0 refills | Status: DC | PRN

## 2018-02-24 MED ORDER — KETOROLAC TROMETHAMINE 30 MG/ML IJ SOLN
15.0000 mg | Freq: Once | INTRAMUSCULAR | Status: AC
  Administered 2018-02-24: 15 mg via INTRAVENOUS
  Filled 2018-02-24: qty 1

## 2018-02-24 MED ORDER — IBUPROFEN 600 MG PO TABS: 600.0000 mg | ORAL_TABLET | Freq: Three times a day (TID) | ORAL | 0 refills | Status: DC | PRN

## 2018-02-24 NOTE — ED Triage Notes (Signed)
Patient to ED with right flank pain since yesterday. States history of kidney stones and this is "the same." Has been trying to drink fluids but it's "not happening." Patient is visibly uncomfortale. Nausea this morning.

## 2018-02-24 NOTE — ED Provider Notes (Signed)
[facility] Emergency Department Provider Note  ____________________________________________   First MD Initiated Contact with Patient 02/24/18 [phone]     (approximate)  I have reviewed the triage vital signs and the nursing notes.   HISTORY  Chief Complaint Flank Pain (Right)   HPI John [name] is a 57 y.o. male who comes to the emergency department with sudden onset severe right flank pain radiating towards his right groin that began several hours ago.  The pain is severe constant.  Nothing seems to make it better or worse.  Associated with nausea and he is vomited once.  He has a long-standing history of kidney stones this feels similar.  He denies fevers or chills.  He does have some back pain.  No testicular pain.  No dysuria frequency or hesitancy.  Past Medical History:  Diagnosis Date  . History of kidney stones   . Hyperlipidemia     Patient Active Problem List   Diagnosis Date Noted  . Renal colic on left side 10/11/2017  . Kidney stone 10/07/2017    Past Surgical History:  Procedure Laterality Date  . CHOLECYSTECTOMY    . CYSTOSCOPY/URETEROSCOPY/HOLMIUM LASER/STENT PLACEMENT Left 10/12/2017   Procedure: CYSTOSCOPY/URETEROSCOPY/HOLMIUM LASER/STENT PLACEMENT;  Surgeon: Riki Altes, MD;  Location: [facility] ORS;  Service: Urology;  Laterality: Left;  . SHOULDER ARTHROSCOPY WITH SUBACROMIAL DECOMPRESSION Right 10/27/2016   Procedure: SHOULDER ARTHROSCOPY WITH SUBACROMIAL DECOMPRESSION  AND DEBRIDEMENT;  Surgeon: Christena Flake, MD;  Location: [facility] ORS;  Service: Orthopedics;  Laterality: Right;  . SHOULDER SURGERY Right 2003    Prior to Admission medications   Medication Sig Start Date End Date Taking? Authorizing Provider  atorvastatin (LIPITOR) 10 MG tablet Take 10 mg by mouth daily.    [provider]  cyclobenzaprine (FLEXERIL) 10 MG tablet Take 10 mg by mouth daily as needed.    [provider]  ibuprofen (ADVIL,MOTRIN) 600  MG tablet Take 1 tablet (600 mg total) by mouth every 8 (eight) hours as needed. 02/24/18   Merrily Brittle, MD  losartan (COZAAR) 25 MG tablet Take 1 tablet by mouth daily. 10/06/17   [provider]  oxyCODONE-acetaminophen (PERCOCET/ROXICET) 5-325 MG tablet Take 1 tablet by mouth every 6 (six) hours as needed for severe pain. 02/24/18   Merrily Brittle, MD  tamsulosin (FLOMAX) 0.4 MG CAPS capsule Take 1 tablet by mouth daily until you pass the kidney stone or no longer have symptoms 10/10/17   Loleta Rose, MD    Allergies Patient has no known allergies.  Family History  Problem Relation Age of Onset  . Heart disease Father   . Prostate cancer Neg Hx   . Bladder Cancer Neg Hx   . Kidney cancer Neg Hx     Social History Social History   Tobacco Use  . Smoking status: Never Smoker  . Smokeless tobacco: Current User    Types: Snuff  Substance Use Topics  . Alcohol use: No  . Drug use: No    Review of Systems Constitutional: No fever/chills Eyes: No visual changes. ENT: No sore throat. Cardiovascular: Denies chest pain. Respiratory: Denies shortness of breath. Gastrointestinal: Positive for abdominal pain.  Positive for nausea, positive for vomiting.  No diarrhea.  No constipation. Genitourinary: Negative for dysuria. Musculoskeletal: Positive for back pain. Skin: Negative for rash. Neurological: Negative for headaches, focal weakness or numbness.   ____________________________________________   PHYSICAL EXAM:  VITAL SIGNS: ED Triage Vitals  Enc Vitals Group     BP  02/24/18 0220 (!) 129/92     Pulse Rate 02/24/18 0220 97     Resp 02/24/18 0220 20     Temp 02/24/18 0220 98.6 F (37 C)     Temp Source 02/24/18 0220 Oral     SpO2 02/24/18 0220 94 %     Weight 02/24/18 0222 220 lb (99.8 kg)     Height 02/24/18 0222 6' (1.829 m)     Head Circumference --      Peak Flow --      Pain Score 02/24/18 0222 8     Pain Loc --      Pain Edu? --      Excl. in GC?  --     Constitutional: Alert and oriented x4 appears miserable holding his right flank and tearful Eyes: PERRL EOMI. Head: Atraumatic. Nose: No congestion/rhinnorhea. Mouth/Throat: No trismus Neck: No stridor.   Cardiovascular: Normal rate, regular rhythm. Grossly normal heart sounds.  Good peripheral circulation. Respiratory: Normal respiratory effort.  No retractions. Lungs CTAB and moving good air Gastrointestinal: Soft nontender Musculoskeletal: No lower extremity edema   Neurologic:  Normal speech and language. No gross focal neurologic deficits are appreciated. Skin:  Skin is warm, dry and intact. No rash noted. Psychiatric: Mood and affect are normal. Speech and behavior are normal.    ____________________________________________   DIFFERENTIAL includes but not limited to  Renal colic, pyelonephritis, infected stone, AAA ____________________________________________   LABS (all labs ordered are listed, but only abnormal results are displayed)  Labs Reviewed  COMPREHENSIVE METABOLIC PANEL - Abnormal; Notable for the following components:      Result Value   Glucose, Bld 128 (*)    Creatinine, Ser 1.97 (*)    Calcium 8.8 (*)    Total Bilirubin 2.0 (*)    GFR calc non Af Amer 36 (*)    GFR calc Af Amer 42 (*)    All other components within normal limits  CBC WITH DIFFERENTIAL/PLATELET - Abnormal; Notable for the following components:   WBC 16.3 (*)    RDW 14.8 (*)    Neutro Abs 13.6 (*)    Monocytes Absolute 1.3 (*)    All other components within normal limits    Lab work reviewed by me with elevated white count which is nonspecific.  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  He does have chronic kidney disease and is roughly at his baseline. __________________________________________  EKG   ____________________________________________  RADIOLOGY  CT stone reviewed by me with right-sided stone nearly at the  UVJ ____________________________________________   PROCEDURES  Procedure(s) performed: no  Procedures  Critical Care performed: no  Observation: no ____________________________________________   INITIAL IMPRESSION / ASSESSMENT AND PLAN / ED COURSE  Pertinent labs & imaging results that were available during my care of the patient were reviewed by me and considered in my medical decision making (see chart for details).  The patient arrives very uncomfortable appearing with a history consistent with renal colic.  Given 8 mg of IV morphine, Zofran, and Toradol and I will send him off to CT scan.  Following morphine and Toradol the patient's pain is significantly improved.  CT does show a 3 to 4 mm stone nearly at the right UVJ.  He is already taking Flomax.  No evidence of infection.  I will refer him back to his urologist with a short course of Percocet and Advil.  He is discharged home in improved condition with strict return precautions.  He verbalizes understanding  and agree with the plan.      ____________________________________________   FINAL CLINICAL IMPRESSION(S) / ED DIAGNOSES  Final diagnoses:  Kidney stone      NEW MEDICATIONS STARTED DURING THIS VISIT:  Discharge Medication List as of 02/24/2018  3:44 AM    START taking these medications   Details  ibuprofen (ADVIL,MOTRIN) 600 MG tablet Take 1 tablet (600 mg total) by mouth every 8 (eight) hours as needed., Starting Sat 02/24/2018, Print    oxyCODONE-acetaminophen (PERCOCET/ROXICET) 5-325 MG tablet Take 1 tablet by mouth every 6 (six) hours as needed for severe pain., Starting Sat 02/24/2018, Print         Note:  This document was prepared using Dragon voice recognition software and may include unintentional dictation errors.     Merrily Brittle, MD 02/26/18 1028

## 2018-02-24 NOTE — ED Notes (Signed)
Patient to CT scan with rad tech via wheelchair. Tolerated IV pain medications without incident.

## 2018-02-24 NOTE — Discharge Instructions (Signed)
It was a pleasure to take care of you today, and thank you for coming to our emergency department.  If you have any questions or concerns before leaving please ask the nurse to grab me and I'm more than happy to go through your aftercare instructions again.  If you were prescribed any opioid pain medication today such as Norco, Vicodin, Percocet, morphine, hydrocodone, or oxycodone please make sure you do not drive when you are taking this medication as it can alter your ability to drive safely.  If you have any concerns once you are home that you are not improving or are in fact getting worse before you can make it to your follow-up appointment, please do not hesitate to call 911 and come back for further evaluation.  Merrily Brittle, MD  Results for orders placed or performed during the hospital encounter of 02/24/18  Comprehensive metabolic panel  Result Value Ref Range   Sodium 138 135 - 145 mmol/L   Potassium 4.2 3.5 - 5.1 mmol/L   Chloride 106 101 - 111 mmol/L   CO2 24 22 - 32 mmol/L   Glucose, Bld 128 (H) 65 - 99 mg/dL   BUN 15 6 - 20 mg/dL   Creatinine, Ser 1.61 (H) 0.61 - 1.24 mg/dL   Calcium 8.8 (L) 8.9 - 10.3 mg/dL   Total Protein 7.4 6.5 - 8.1 g/dL   Albumin 4.1 3.5 - 5.0 g/dL   AST 29 15 - 41 U/L   ALT 29 17 - 63 U/L   Alkaline Phosphatase 69 38 - 126 U/L   Total Bilirubin 2.0 (H) 0.3 - 1.2 mg/dL   GFR calc non Af Amer 36 (L) >60 mL/min   GFR calc Af Amer 42 (L) >60 mL/min   Anion gap 8 5 - 15  CBC with Differential  Result Value Ref Range   WBC 16.3 (H) 3.8 - 10.6 K/uL   RBC 5.48 4.40 - 5.90 MIL/uL   Hemoglobin 16.2 13.0 - 18.0 g/dL   HCT 09.6 04.5 - 40.9 %   MCV 87.5 80.0 - 100.0 fL   MCH 29.6 26.0 - 34.0 pg   MCHC 33.9 32.0 - 36.0 g/dL   RDW 81.1 (H) 91.4 - 78.2 %   Platelets 254 150 - 440 K/uL   Neutrophils Relative % 84 %   Neutro Abs 13.6 (H) 1.4 - 6.5 K/uL   Lymphocytes Relative 7 %   Lymphs Abs 1.2 1.0 - 3.6 K/uL   Monocytes Relative 8 %   Monocytes  Absolute 1.3 (H) 0.2 - 1.0 K/uL   Eosinophils Relative 1 %   Eosinophils Absolute 0.1 0 - 0.7 K/uL   Basophils Relative 0 %   Basophils Absolute 0.0 0 - 0.1 K/uL   Ct Renal Stone Study  Result Date: 02/24/2018 CLINICAL DATA:  Right-sided flank pain EXAM: CT ABDOMEN AND PELVIS WITHOUT CONTRAST TECHNIQUE: Multidetector CT imaging of the abdomen and pelvis was performed following the standard protocol without IV contrast. COMPARISON:  Radiograph 10/10/2017, CT 10/07/2017 FINDINGS: Lower chest: Lung bases demonstrate no acute consolidation or effusion. Heart size within normal limits. Hepatobiliary: No focal liver abnormality is seen. Status post cholecystectomy. No biliary dilatation. Pancreas: Unremarkable. No pancreatic ductal dilatation or surrounding inflammatory changes. Spleen: Normal in size without focal abnormality. Adrenals/Urinary Tract: Adrenal glands are within normal limits. Multiple cysts within the kidneys. Multiple small intrarenal stones bilaterally. Moderate right hydronephrosis and hydroureter, secondary to a 3 x 4 mm stone in the distal right ureter, about  a cm proximal to the right UVJ. Bladder otherwise normal Stomach/Bowel: Stomach is within normal limits. Appendix appears normal. No evidence of bowel wall thickening, distention, or inflammatory changes. Sigmoid colon diverticular disease without acute inflammation Vascular/Lymphatic: Mild aortic atherosclerosis. No aneurysmal dilatation. No significantly enlarged lymph nodes Reproductive: Mildly enlarged prostate Other: Fat containing left inguinal hernia. No free air or free fluid. Small fat in the umbilical region Musculoskeletal: No acute or significant osseous findings. IMPRESSION: 1. Moderate right hydronephrosis and hydroureter, secondary to a 3 x 4 mm stone in the distal right ureter about a cm proximal to the right UVJ 2. There are multiple intrarenal stones bilaterally 3. Sigmoid colon diverticular disease without acute  inflammation Electronically Signed   By: Jasmine Pang M.D.   On: 02/24/2018 03:06

## 2018-02-24 NOTE — ED Notes (Signed)
Patient seen and treated by EDP in subwait. Given discharge instructions. Patient verbalizes understanding.

## 2018-04-26 ENCOUNTER — Other Ambulatory Visit: Payer: Self-pay

## 2018-04-26 ENCOUNTER — Ambulatory Visit
Admission: RE | Admit: 2018-04-26 | Discharge: 2018-04-26 | Disposition: A | Discharge status: Home / Self Care | Payer: BLUE CROSS/BLUE SHIELD | Source: Ambulatory Visit | Attending: Urology | Admitting: Urology

## 2018-04-26 DIAGNOSIS — N200 Calculus of kidney: Secondary | ICD-10-CM | POA: Diagnosis not present

## 2018-04-30 ENCOUNTER — Ambulatory Visit: Payer: BLUE CROSS/BLUE SHIELD | Admitting: Urology

## 2018-04-30 ENCOUNTER — Encounter: Payer: Self-pay | Admitting: Urology

## 2018-04-30 VITALS — BP 111/67 | HR 80 | Ht 72.0 in | Wt 220.9 lb

## 2018-04-30 DIAGNOSIS — N200 Calculus of kidney: Secondary | ICD-10-CM

## 2018-04-30 NOTE — Progress Notes (Signed)
04/30/2018 9:03 AM   John [name] 19-Sep-1961 [id number]  Referring provider: Jaclyn Shaggyate, Denny C, MD [address]   [place], [place] [id number]  Chief Complaint  Patient presents with  . Nephrolithiasis    HPI: 57 year old male presents for follow-up of recurrent stone disease.  He has previously been followed in [facility] comprehensive [facility] clinic [facility].  I last saw him in January 2019.  He underwent ureteroscopic removal of a left distal ureteral calculus at that time.  He was seen in the ED in May 2019 with right renal colic and had a 4 mm right UVJ calculus which he subsequently passed.  CT did show bilateral, nonobstructing renal calculi the left greater than the right.   PMH: Past Medical History:  Diagnosis Date  . History of kidney stones   . Hyperlipidemia     Surgical History: Past Surgical History:  Procedure Laterality Date  . CHOLECYSTECTOMY    . CYSTOSCOPY/URETEROSCOPY/HOLMIUM LASER/STENT PLACEMENT Left 10/12/2017   Procedure: CYSTOSCOPY/URETEROSCOPY/HOLMIUM LASER/STENT PLACEMENT;  Surgeon: Riki AltesStoioff, Mckinzy Fuller C, MD;  Location: [facility] ORS;  Service: Urology;  Laterality: Left;  . SHOULDER ARTHROSCOPY WITH SUBACROMIAL DECOMPRESSION Right 10/27/2016   Procedure: SHOULDER ARTHROSCOPY WITH SUBACROMIAL DECOMPRESSION  AND DEBRIDEMENT;  Surgeon: Christena FlakeJohn J Poggi, MD;  Location: [facility] ORS;  Service: Orthopedics;  Laterality: Right;  . SHOULDER SURGERY Right 2003    Home Medications:  Allergies as of 04/30/2018   No Known Allergies     Medication List        Accurate as of 04/30/18  9:03 AM. Always use your most recent med list.          atorvastatin 10 MG tablet Commonly known as:  LIPITOR Take 10 mg by mouth daily.   cyclobenzaprine 10 MG tablet Commonly known as:  FLEXERIL Take 10 mg by mouth daily as needed.   ibuprofen 600 MG tablet Commonly known as:  ADVIL,MOTRIN Take 1 tablet (600 mg total) by mouth every 8 (eight) hours as needed.   losartan 25 MG  tablet Commonly known as:  COZAAR Take 1 tablet by mouth daily.       Allergies: No Known Allergies  Family History: Family History  Problem Relation Age of Onset  . Heart disease Father   . Prostate cancer Neg Hx   . Bladder Cancer Neg Hx   . Kidney cancer Neg Hx     Social History:  reports that he has never smoked. His smokeless tobacco use includes snuff. He reports that he does not drink alcohol or use drugs.  ROS: UROLOGY Frequent Urination?: No Hard to postpone urination?: No Burning/pain with urination?: No Get up at night to urinate?: No Leakage of urine?: No Urine stream starts and stops?: No Trouble starting stream?: No Do you have to strain to urinate?: No Blood in urine?: No Urinary tract infection?: No Sexually transmitted disease?: No Injury to kidneys or bladder?: No Painful intercourse?: No Weak stream?: No Erection problems?: No Penile pain?: No  Gastrointestinal Nausea?: No Vomiting?: No Indigestion/heartburn?: No Diarrhea?: No Constipation?: No  Constitutional Fever: No Night sweats?: No Weight loss?: No Fatigue?: No  Skin Skin rash/lesions?: No Itching?: No  Eyes Blurred vision?: No Double vision?: No  Ears/Nose/Throat Sore throat?: No Sinus problems?: No  Hematologic/Lymphatic Swollen glands?: No Easy bruising?: No  Cardiovascular Leg swelling?: No Chest pain?: No  Respiratory Cough?: No Shortness of breath?: No  Endocrine Excessive thirst?: No  Musculoskeletal Back pain?: No Joint pain?: No  Neurological Headaches?:  No Dizziness?: No  Psychologic Depression?: No Anxiety?: No  Physical Exam: BP 111/67   Pulse 80   Ht 6' (1.829 m)   Wt 220 lb 14.4 oz (100.2 kg)   BMI 29.96 kg/m   Constitutional:  Alert and oriented, No acute distress. HEENT: [place] AT, moist mucus membranes.  Trachea midline, no masses. Cardiovascular: No clubbing, cyanosis, or edema. Respiratory: Normal respiratory effort, no  increased work of breathing. GI: Abdomen is soft, nontender, nondistended, no abdominal masses GU: No CVA tenderness Lymph: No cervical or inguinal lymphadenopathy. Skin: No rashes, bruises or suspicious lesions. Neurologic: Grossly intact, no focal deficits, moving all 4 extremities. Psychiatric: Normal mood and affect.  Pertinent Imaging: KUB reviewed Results for orders placed during the hospital encounter of 04/26/18  Abdomen 1 view (KUB)   Narrative CLINICAL DATA:  Right-sided ureteral calculus, follow-up exam  EXAM: ABDOMEN - 1 VIEW  COMPARISON:  02/24/2018  FINDINGS: Scattered large and small bowel gas is noted. Scattered small calculi are noted overlying the left renal outline similar to that seen on the prior exam. The right renal stone seen previously is not well appreciated due to overlying bowel gas. The distal right ureteral stone is not well appreciated on this exam. Small phlebolith is noted in the lateral on the pelvis on the right.  IMPRESSION: Left renal calculi are again identified and stable. The right renal calculus seen previously is not well appreciated. The distal right ureteral stone is also not well appreciated.   Electronically Signed   By: Alcide Clever M.D.   On: 04/26/2018 11:23     Assessment & Plan:   Bilateral nephrolithiasis-stable.  Recent CT shows bilateral nonobstructing calculi.  KUB today remarkable for left nephrolithiasis.  His wife today did mention on lumbar MRI done at [facility] the report mentions a renal mass.  He does have bilateral renal cysts but no recent studies with contrast.  Will schedule a renal ultrasound/KUB in 6 months.  He will call earlier for recurrent renal colic.  Return in about 6 months (around 10/31/2018) for Recheck, KUB, renal ultrasound.   Riki Altes, MD  [facility] [address], [address] [place], [place] [address] [phone]

## 2018-10-12 ENCOUNTER — Ambulatory Visit
Admission: RE | Admit: 2018-10-12 | Discharge: 2018-10-12 | Disposition: A | Discharge status: Home / Self Care | Payer: BLUE CROSS/BLUE SHIELD | Source: Ambulatory Visit | Attending: Urology | Admitting: Urology

## 2018-10-12 DIAGNOSIS — N200 Calculus of kidney: Secondary | ICD-10-CM | POA: Diagnosis not present

## 2018-10-29 DIAGNOSIS — M109 Gout, unspecified: Secondary | ICD-10-CM | POA: Insufficient documentation

## 2018-10-29 DIAGNOSIS — N400 Enlarged prostate without lower urinary tract symptoms: Secondary | ICD-10-CM | POA: Insufficient documentation

## 2018-10-31 ENCOUNTER — Ambulatory Visit
Admission: RE | Admit: 2018-10-31 | Discharge: 2018-10-31 | Disposition: A | Discharge status: Home / Self Care | Payer: BLUE CROSS/BLUE SHIELD | Source: Ambulatory Visit | Attending: Urology | Admitting: Urology

## 2018-10-31 ENCOUNTER — Ambulatory Visit (INDEPENDENT_AMBULATORY_CARE_PROVIDER_SITE_OTHER): Payer: BLUE CROSS/BLUE SHIELD | Admitting: Urology

## 2018-10-31 ENCOUNTER — Encounter: Payer: Self-pay | Admitting: Urology

## 2018-10-31 VITALS — BP 130/77 | HR 86 | Ht 72.0 in | Wt 220.0 lb

## 2018-10-31 DIAGNOSIS — N200 Calculus of kidney: Secondary | ICD-10-CM | POA: Diagnosis not present

## 2018-10-31 DIAGNOSIS — N210 Calculus in bladder: Secondary | ICD-10-CM

## 2018-10-31 NOTE — Progress Notes (Signed)
10/31/2018 9:17 AM   John [name] Apr 15, 1961 [id number]  Referring provider: Jaclyn Shaggy, MD [address]   [place], [place] [address]  Chief Complaint  Patient presents with  . Nephrolithiasis    HPI: 58 year old male presents for semiannual follow-up of nephrolithiasis.  Since his last visit he has passed a few small stones.  Renal ultrasound performed earlier this month showed bilateral, nonobstructing renal calculi in addition to a 13 mm bladder calculus.  He is presently asymptomatic and denies flank/abdominal/pelvic pain.  He has no voiding symptoms.  Denies gross hematuria.   PMH: Past Medical History:  Diagnosis Date  . History of kidney stones   . Hyperlipidemia     Surgical History: Past Surgical History:  Procedure Laterality Date  . CHOLECYSTECTOMY    . CYSTOSCOPY/URETEROSCOPY/HOLMIUM LASER/STENT PLACEMENT Left 10/12/2017   Procedure: CYSTOSCOPY/URETEROSCOPY/HOLMIUM LASER/STENT PLACEMENT;  Surgeon: Riki Altes, MD;  Location: [facility] ORS;  Service: Urology;  Laterality: Left;  . SHOULDER ARTHROSCOPY WITH SUBACROMIAL DECOMPRESSION Right 10/27/2016   Procedure: SHOULDER ARTHROSCOPY WITH SUBACROMIAL DECOMPRESSION  AND DEBRIDEMENT;  Surgeon: Christena Flake, MD;  Location: [facility] ORS;  Service: Orthopedics;  Laterality: Right;  . SHOULDER SURGERY Right 2003    Home Medications:  Allergies as of 10/31/2018   No Known Allergies     Medication List       Accurate as of October 31, 2018  9:17 AM. Always use your most recent med list.        allopurinol 100 MG tablet Commonly known as:  ZYLOPRIM   atorvastatin 10 MG tablet Commonly known as:  LIPITOR Take 10 mg by mouth daily.   Colchicine 0.6 MG Caps Take by mouth as needed.   cyclobenzaprine 10 MG tablet Commonly known as:  FLEXERIL Take 10 mg by mouth daily as needed.   fluticasone 50 MCG/ACT nasal spray Commonly known as:  FLONASE U 1 SPRAY IEN BID   losartan 25 MG tablet Commonly known  as:  COZAAR Take 1 tablet by mouth daily.   tamsulosin 0.4 MG Caps capsule Commonly known as:  FLOMAX TK ONE C PO QHS       Allergies: No Known Allergies  Family History: Family History  Problem Relation Age of Onset  . Heart disease Father   . Prostate cancer Neg Hx   . Bladder Cancer Neg Hx   . Kidney cancer Neg Hx     Social History:  reports that he has never smoked. His smokeless tobacco use includes snuff. He reports that he does not drink alcohol or use drugs.  ROS: UROLOGY Frequent Urination?: No Hard to postpone urination?: No Burning/pain with urination?: No Get up at night to urinate?: No Leakage of urine?: No Urine stream starts and stops?: No Trouble starting stream?: No Do you have to strain to urinate?: No Blood in urine?: No Urinary tract infection?: No Sexually transmitted disease?: No Injury to kidneys or bladder?: No Painful intercourse?: No Weak stream?: No Erection problems?: No Penile pain?: No  Gastrointestinal Nausea?: No Vomiting?: No Indigestion/heartburn?: No Diarrhea?: No Constipation?: No  Constitutional Fever: No Night sweats?: No Weight loss?: No Fatigue?: No  Skin Skin rash/lesions?: No Itching?: No  Eyes Blurred vision?: No Double vision?: No  Ears/Nose/Throat Sore throat?: No Sinus problems?: No  Hematologic/Lymphatic Swollen glands?: No Easy bruising?: No  Cardiovascular Leg swelling?: No Chest pain?: No  Respiratory Cough?: No Shortness of breath?: No  Endocrine Excessive thirst?: No  Musculoskeletal Back pain?: No Joint  pain?: No  Neurological Headaches?: No Dizziness?: No  Psychologic Depression?: No Anxiety?: No  Physical Exam: BP 130/77 (BP Location: Left Arm, Patient Position: Sitting, Cuff Size: Large)   Pulse 86   Ht 6' (1.829 m)   Wt 220 lb (99.8 kg)   BMI 29.84 kg/m   Constitutional:  Alert and oriented, No acute distress. HEENT: [place] AT, moist mucus membranes.  Trachea  midline, no masses. Cardiovascular: No clubbing, cyanosis, or edema. Respiratory: Normal respiratory effort, no increased work of breathing. GI: Abdomen is soft, nontender, nondistended, no abdominal masses GU: No CVA tenderness Lymph: No cervical or inguinal lymphadenopathy. Skin: No rashes, bruises or suspicious lesions. Neurologic: Grossly intact, no focal deficits, moving all 4 extremities. Psychiatric: Normal mood and affect.    Assessment & Plan:   58 year old male with bilateral, nonobstructing renal calculi.  Survey views of the bladder on ultrasound also showed a 13 mm bladder calculus.  He has a history of uric acid stones however was unable to tolerate potassium citrate or Polycitra.  He did not have a bladder calculus on prior CT of May 2019.  Renal/bladder calculus not visualized on KUB today.  I discussed cystoscopy to confirm whether he indeed has a bladder stone present however he was reluctant to schedule.  I also discussed pelvic CT. He would like to schedule a CT however is due to have back surgery early next week and would like to schedule for 1 month out.  Riki AltesScott C Saesha Llerenas, MD  [facility] [address], [address] [place], [place] [address] [phone]

## 2018-10-31 NOTE — Patient Instructions (Signed)
Dietary Guidelines to Help Prevent Kidney Stones  Kidney stones are deposits of minerals and salts that form inside your kidneys. Your risk of developing kidney stones may be greater depending on your diet, your lifestyle, the medicines you take, and whether you have certain medical conditions. Most people can reduce their chances of developing kidney stones by following the instructions below. Depending on your overall health and the type of kidney stones you tend to develop, your dietitian may give you more specific instructions.  What are tips for following this plan?  Reading food labels   Choose foods with "no salt added" or "low-salt" labels. Limit your sodium intake to less than 1500 mg per day.   Choose foods with calcium for each meal and snack. Try to eat about 300 mg of calcium at each meal. Foods that contain 200-500 mg of calcium per serving include:  ? 8 oz (237 ml) of milk, fortified nondairy milk, and fortified fruit juice.  ? 8 oz (237 ml) of kefir, yogurt, and soy yogurt.  ? 4 oz (118 ml) of tofu.  ? 1 oz of cheese.  ? 1 cup (300 g) of dried figs.  ? 1 cup (91 g) of cooked broccoli.  ? 1-3 oz can of sardines or mackerel.   Most people need 1000 to 1500 mg of calcium each day. Talk to your dietitian about how much calcium is recommended for you.  Shopping   Buy plenty of fresh fruits and vegetables. Most people do not need to avoid fruits and vegetables, even if they contain nutrients that may contribute to kidney stones.   When shopping for convenience foods, choose:  ? Whole pieces of fruit.  ? Premade salads with dressing on the side.  ? Low-fat fruit and yogurt smoothies.   Avoid buying frozen meals or prepared deli foods.   Look for foods with live cultures, such as yogurt and kefir.  Cooking   Do not add salt to food when cooking. Place a salt shaker on the table and allow each person to add his or her own salt to taste.   Use vegetable protein, such as beans, textured vegetable  protein (TVP), or tofu instead of meat in pasta, casseroles, and soups.  Meal planning     Eat less salt, if told by your dietitian. To do this:  ? Avoid eating processed or premade food.  ? Avoid eating fast food.   Eat less animal protein, including cheese, meat, poultry, or fish, if told by your dietitian. To do this:  ? Limit the number of times you have meat, poultry, fish, or cheese each week. Eat a diet free of meat at least 2 days a week.  ? Eat only one serving each day of meat, poultry, fish, or seafood.  ? When you prepare animal protein, cut pieces into small portion sizes. For most meat and fish, one serving is about the size of one deck of cards.   Eat at least 5 servings of fresh fruits and vegetables each day. To do this:  ? Keep fruits and vegetables on hand for snacks.  ? Eat 1 piece of fruit or a handful of berries with breakfast.  ? Have a salad and fruit at lunch.  ? Have two kinds of vegetables at dinner.   Limit foods that are high in a substance called oxalate. These include:  ? Spinach.  ? Rhubarb.  ? Beets.  ? Potato chips and french fries.  ? Nuts.     If you regularly take a diuretic medicine, make sure to eat at least 1-2 fruits or vegetables high in potassium each day. These include:  ? Avocado.  ? Banana.  ? Orange, prune, carrot, or tomato juice.  ? Baked potato.  ? Cabbage.  ? Beans and split peas.  General instructions     Drink enough fluid to keep your urine clear or pale yellow. This is the most important thing you can do.   Talk to your health care provider and dietitian about taking daily supplements. Depending on your health and the cause of your kidney stones, you may be advised:  ? Not to take supplements with vitamin C.  ? To take a calcium supplement.  ? To take a daily probiotic supplement.  ? To take other supplements such as magnesium, fish oil, or vitamin B6.   Take all medicines and supplements as told by your health care provider.   Limit alcohol intake to no  more than 1 drink a day for nonpregnant women and 2 drinks a day for men. One drink equals 12 oz of beer, 5 oz of wine, or 1 oz of hard liquor.   Lose weight if told by your health care provider. Work with your dietitian to find strategies and an eating plan that works best for you.  What foods are not recommended?  Limit your intake of the following foods, or as told by your dietitian. Talk to your dietitian about specific foods you should avoid based on the type of kidney stones and your overall health.  Grains  Breads. Bagels. Rolls. Baked goods. Salted crackers. Cereal. Pasta.  Vegetables  Spinach. Rhubarb. Beets. Canned vegetables. Pickles. Olives.  Meats and other protein foods  Nuts. Nut butters. Large portions of meat, poultry, or fish. Salted or cured meats. Deli meats. Hot dogs. Sausages.  Dairy  Cheese.  Beverages  Regular soft drinks. Regular vegetable juice.  Seasonings and other foods  Seasoning blends with salt. Salad dressings. Canned soups. Soy sauce. Ketchup. Barbecue sauce. Canned pasta sauce. Casseroles. Pizza. Lasagna. Frozen meals. Potato chips. French fries.  Summary   You can reduce your risk of kidney stones by making changes to your diet.   The most important thing you can do is drink enough fluid. You should drink enough fluid to keep your urine clear or pale yellow.   Ask your health care provider or dietitian how much protein from animal sources you should eat each day, and also how much salt and calcium you should have each day.  This information is not intended to replace advice given to you by your health care provider. Make sure you discuss any questions you have with your health care provider.  Document Released: 01/14/2011 Document Revised: 08/30/2016 Document Reviewed: 08/30/2016  Elsevier Interactive Patient Education  2019 [employer]

## 2018-11-02 ENCOUNTER — Encounter: Payer: Self-pay | Admitting: Urology

## 2018-11-22 ENCOUNTER — Ambulatory Visit
Admission: RE | Admit: 2018-11-22 | Discharge: 2018-11-22 | Disposition: A | Discharge status: Home / Self Care | Payer: BLUE CROSS/BLUE SHIELD | Source: Ambulatory Visit | Attending: Urology | Admitting: Urology

## 2018-11-22 DIAGNOSIS — N210 Calculus in bladder: Secondary | ICD-10-CM | POA: Insufficient documentation

## 2018-11-23 ENCOUNTER — Telehealth: Payer: Self-pay | Admitting: Family Medicine

## 2018-11-23 NOTE — Telephone Encounter (Signed)
-----   Message from Riki Altes, MD sent at 11/23/2018  1:01 PM EST ----- Pelvic CT does show a bladder calculus.  Without treatment this will continue to enlarge in size.  Recommend scheduling cystolitholapaxy after he recovers from his back surgery.

## 2018-11-23 NOTE — Telephone Encounter (Signed)
Patient notified and voiced understanding. Patient states he will call to get the surgery scheduled once he is released from back surgeon.

## 2018-11-26 NOTE — Telephone Encounter (Signed)
Patient [facility] stating he is ready to schedule surgery.

## 2018-11-27 NOTE — Telephone Encounter (Signed)
Will give scheduling sheet to Leah 

## 2018-12-07 ENCOUNTER — Encounter
Admission: RE | Admit: 2018-12-07 | Discharge: 2018-12-07 | Disposition: A | Discharge status: Home / Self Care | Payer: BLUE CROSS/BLUE SHIELD | Source: Ambulatory Visit | Attending: Urology | Admitting: Urology

## 2018-12-07 ENCOUNTER — Other Ambulatory Visit: Payer: Self-pay

## 2018-12-07 DIAGNOSIS — N200 Calculus of kidney: Secondary | ICD-10-CM

## 2018-12-07 DIAGNOSIS — Z01818 Encounter for other preprocedural examination: Secondary | ICD-10-CM

## 2018-12-07 HISTORY — DX: Essential (primary) hypertension: I10

## 2018-12-07 NOTE — Pre-Procedure Instructions (Signed)
No orders for patient at PAT visit. Orders arrived after patient appointment. UC to be obtained in Urology office.

## 2018-12-07 NOTE — Patient Instructions (Signed)
Your procedure is scheduled on: Tuesday 12/18/18  Report to DAY SURGERY DEPARTMENT LOCATED ON 2ND FLOOR MEDICAL MALL ENTRANCE. To find out your arrival time please call [phone] between 1PM - 3PM on Monday 12/17/18.   Remember: Instructions that are not followed completely may result in serious medical risk, up to and including death, or upon the discretion of your surgeon and anesthesiologist your surgery may need to be rescheduled.      _X__ 1. Do not eat food after midnight the night before your procedure.                 No gum chewing or hard candies. You may drink clear liquids up to 2 hours                 before you are scheduled to arrive for your surgery- DO NOT drink clear                 liquids within 2 hours of the start of your surgery.                 Clear Liquids include:  water, apple juice without pulp, clear carbohydrate                 drink such as Clearfast or Gatorade, Black Coffee or Tea (Do not add                 milk or creamer to coffee or tea).   __X__2.  On the morning of surgery brush your teeth with toothpaste and water, you may rinse your mouth with mouthwash if you wish.  Do not swallow any toothpaste or mouthwash.       _X__ 3.  No Alcohol for 24 hours before or after surgery.     _X__ 4.  Do Not Smoke or use e-cigarettes For 24 Hours Prior to Your Surgery.                 Do not use any chewable tobacco products for at least 6 hours prior to                 Surgery.    __X__5.  Notify your doctor if there is any change in your medical condition      (cold, fever, infections).       Do not wear jewelry, make-up, hairpins, clips or nail polish. Do not wear lotions, powders, or perfumes. You may wear deodorant. Do not shave 48 hours prior to surgery. Men may shave face and neck. Do not bring valuables to the hospital.      [facility] is not responsible for any belongings or valuables.    Contacts, dentures/partials or body  piercings may not be worn into surgery. Bring a case for your contacts, glasses or hearing aids, a denture cup will be supplied.    Patients discharged the day of surgery will not be allowed to drive home.     Please read over the following fact sheets that you were given:   MRSA Information    __X__ Take these medicines the morning of surgery with A SIP OF WATER:     1. NONE    __X__ Stop Anti-inflammatories 7 days before surgery such as Advil, Ibuprofen, Motrin, BC or Goodies Powder, Naprosyn, Naproxen, Aleve, Aspirin, Meloxicam. May take Tylenol if needed for pain or discomfort.     __X__ Don't start any new herbal supplements before your procedure.

## 2018-12-10 ENCOUNTER — Other Ambulatory Visit: Payer: BLUE CROSS/BLUE SHIELD

## 2018-12-10 DIAGNOSIS — Z01818 Encounter for other preprocedural examination: Secondary | ICD-10-CM

## 2018-12-13 LAB — CULTURE, URINE COMPREHENSIVE

## 2018-12-17 MED ORDER — CEFAZOLIN SODIUM-DEXTROSE 2-4 GM/100ML-% IV SOLN
2.0000 g | Freq: Once | INTRAVENOUS | Status: AC
  Administered 2018-12-18: 2 g via INTRAVENOUS

## 2018-12-18 ENCOUNTER — Ambulatory Visit: Payer: BLUE CROSS/BLUE SHIELD | Admitting: Certified Registered"

## 2018-12-18 ENCOUNTER — Encounter
Admission: RE | Disposition: A | Discharge status: Home / Self Care | Payer: Self-pay | Source: Ambulatory Visit | Attending: Urology

## 2018-12-18 ENCOUNTER — Ambulatory Visit
Admission: RE | Admit: 2018-12-18 | Discharge: 2018-12-18 | Disposition: A | Discharge status: Home / Self Care | Payer: BLUE CROSS/BLUE SHIELD | Source: Ambulatory Visit | Attending: Urology | Admitting: Urology

## 2018-12-18 ENCOUNTER — Other Ambulatory Visit: Payer: Self-pay

## 2018-12-18 DIAGNOSIS — I10 Essential (primary) hypertension: Secondary | ICD-10-CM | POA: Diagnosis not present

## 2018-12-18 DIAGNOSIS — Z79899 Other long term (current) drug therapy: Secondary | ICD-10-CM | POA: Insufficient documentation

## 2018-12-18 DIAGNOSIS — E785 Hyperlipidemia, unspecified: Secondary | ICD-10-CM | POA: Insufficient documentation

## 2018-12-18 DIAGNOSIS — N210 Calculus in bladder: Secondary | ICD-10-CM

## 2018-12-18 DIAGNOSIS — Z87442 Personal history of urinary calculi: Secondary | ICD-10-CM | POA: Diagnosis not present

## 2018-12-18 DIAGNOSIS — F17290 Nicotine dependence, other tobacco product, uncomplicated: Secondary | ICD-10-CM | POA: Insufficient documentation

## 2018-12-18 DIAGNOSIS — M1990 Unspecified osteoarthritis, unspecified site: Secondary | ICD-10-CM | POA: Insufficient documentation

## 2018-12-18 HISTORY — PX: CYSTOSCOPY WITH LITHOLAPAXY: SHX1425

## 2018-12-18 SURGERY — CYSTOSCOPY, WITH BLADDER CALCULUS LITHOLAPAXY
Anesthesia: General

## 2018-12-18 MED ORDER — CEFAZOLIN SODIUM-DEXTROSE 2-4 GM/100ML-% IV SOLN
INTRAVENOUS | Status: AC
  Filled 2018-12-18: qty 100

## 2018-12-18 MED ORDER — LIDOCAINE HCL (CARDIAC) PF 100 MG/5ML IV SOSY
PREFILLED_SYRINGE | INTRAVENOUS | Status: DC | PRN
  Administered 2018-12-18: 100 mg via INTRAVENOUS

## 2018-12-18 MED ORDER — MIDAZOLAM HCL 2 MG/2ML IJ SOLN
INTRAMUSCULAR | Status: AC
  Filled 2018-12-18: qty 2

## 2018-12-18 MED ORDER — PROPOFOL 10 MG/ML IV BOLUS
INTRAVENOUS | Status: DC | PRN
  Administered 2018-12-18: 180 mg via INTRAVENOUS

## 2018-12-18 MED ORDER — FAMOTIDINE 20 MG PO TABS
20.0000 mg | ORAL_TABLET | Freq: Once | ORAL | Status: AC
  Administered 2018-12-18: 20 mg via ORAL

## 2018-12-18 MED ORDER — ONDANSETRON HCL 4 MG/2ML IJ SOLN
INTRAMUSCULAR | Status: DC | PRN
  Administered 2018-12-18: 4 mg via INTRAVENOUS

## 2018-12-18 MED ORDER — EPHEDRINE SULFATE 50 MG/ML IJ SOLN
INTRAMUSCULAR | Status: DC | PRN
  Administered 2018-12-18: 10 mg via INTRAVENOUS

## 2018-12-18 MED ORDER — ONDANSETRON HCL 4 MG/2ML IJ SOLN: 4.0000 mg | Freq: Once | INTRAMUSCULAR | Status: DC | PRN

## 2018-12-18 MED ORDER — LACTATED RINGERS IV SOLN
INTRAVENOUS | Status: DC
  Administered 2018-12-18: 07:00:00 via INTRAVENOUS

## 2018-12-18 MED ORDER — PROPOFOL 10 MG/ML IV BOLUS
INTRAVENOUS | Status: AC
  Filled 2018-12-18: qty 20

## 2018-12-18 MED ORDER — FENTANYL CITRATE (PF) 100 MCG/2ML IJ SOLN
INTRAMUSCULAR | Status: AC
  Filled 2018-12-18: qty 2

## 2018-12-18 MED ORDER — PHENYLEPHRINE HCL 10 MG/ML IJ SOLN
INTRAMUSCULAR | Status: DC | PRN
  Administered 2018-12-18: 200 ug via INTRAVENOUS
  Administered 2018-12-18: 100 ug via INTRAVENOUS

## 2018-12-18 MED ORDER — FAMOTIDINE 20 MG PO TABS
ORAL_TABLET | ORAL | Status: AC
  Filled 2018-12-18: qty 1

## 2018-12-18 MED ORDER — DEXAMETHASONE SODIUM PHOSPHATE 10 MG/ML IJ SOLN
INTRAMUSCULAR | Status: DC | PRN
  Administered 2018-12-18: 10 mg via INTRAVENOUS

## 2018-12-18 MED ORDER — FENTANYL CITRATE (PF) 100 MCG/2ML IJ SOLN: 25.0000 ug | INTRAMUSCULAR | Status: DC | PRN

## 2018-12-18 MED ORDER — MIDAZOLAM HCL 2 MG/2ML IJ SOLN
INTRAMUSCULAR | Status: DC | PRN
  Administered 2018-12-18: 2 mg via INTRAVENOUS

## 2018-12-18 MED ORDER — FENTANYL CITRATE (PF) 100 MCG/2ML IJ SOLN
INTRAMUSCULAR | Status: DC | PRN
  Administered 2018-12-18 (×2): 50 ug via INTRAVENOUS

## 2018-12-18 SURGICAL SUPPLY — 16 items
BAG DRAIN CYSTO-URO LG1000N (MISCELLANEOUS) ×2 IMPLANT
BASKET ZERO TIP 1.9FR (BASKET) IMPLANT
COVER WAND RF STERILE (DRAPES) IMPLANT
GLOVE BIO SURGEON STRL SZ8 (GLOVE) ×2 IMPLANT
GOWN STRL REUS W/ TWL LRG LVL3 (GOWN DISPOSABLE) ×2 IMPLANT
GOWN STRL REUS W/ TWL XL LVL3 (GOWN DISPOSABLE) ×1 IMPLANT
GOWN STRL REUS W/TWL LRG LVL3 (GOWN DISPOSABLE) ×2
GOWN STRL REUS W/TWL XL LVL3 (GOWN DISPOSABLE) ×1
IV NS IRRIG 3000ML ARTHROMATIC (IV SOLUTION) ×4 IMPLANT
KIT TURNOVER CYSTO (KITS) ×2 IMPLANT
PACK CYSTO AR (MISCELLANEOUS) ×2 IMPLANT
SET IRRIG Y TYPE TUR BLADDER L (SET/KITS/TRAYS/PACK) ×2 IMPLANT
SURGILUBE 2OZ TUBE FLIPTOP (MISCELLANEOUS) ×2 IMPLANT
SYRINGE IRR TOOMEY STRL 70CC (SYRINGE) ×2 IMPLANT
WATER STERILE IRR 1000ML POUR (IV SOLUTION) ×2 IMPLANT
WATER STERILE IRR 3000ML UROMA (IV SOLUTION) IMPLANT

## 2018-12-18 NOTE — Anesthesia Post-op Follow-up Note (Signed)
Anesthesia QCDR form completed.        

## 2018-12-18 NOTE — Discharge Instructions (Signed)
Cystoscopy patient instructions  Following a cystoscopy, a catheter (a flexible rubber tube) is sometimes left in place to empty the bladder. This may cause some discomfort or a feeling that you need to urinate. Your doctor determines the period of time that the catheter will be left in place. You may have bloody urine for two to three days (Call your doctor if the amount of bleeding increases or does not subside).  You may pass blood clots in your urine, especially if you had a biopsy. It is not unusual to pass small blood clots and have some bloody urine a couple of weeks after your cystoscopy. Again, call your doctor if the bleeding does not subside. You may have: Dysuria (painful urination) Frequency (urinating often) Urgency (strong desire to urinate)  These symptoms are common especially if medicine is instilled into the bladder or a ureteral stent is placed. Avoiding alcohol and caffeine, such as coffee, tea, and chocolate, may help relieve these symptoms. Drink plenty of water, unless otherwise instructed.   Cystoscopy results are available soon after the procedure; biopsy results usually take two to four days. Your doctor will discuss the results of your exam with you. Before you go home, you will be given specific instructions for follow-up care. Special Instructions:  1  If you are going home with a catheter in place do not take a tub bath until removed by your doctor.  2  You may resume your normal activities.  3  Do not drive or operate machinery if you are taking narcotic pain medicine.  4  Be sure to keep all follow-up appointments with your doctor.  5 You may use over-the-counter AZO for burning with urination  6 Call Your Doctor If: The catheter is not draining  You have severe pain  You are unable to urinate  You have a fever over 101  You have severe bleeding    AMBULATORY SURGERY  DISCHARGE INSTRUCTIONS   1) The drugs that you were given will stay in your system  until tomorrow so for the next 24 hours you should not:  A) Drive an automobile B) Make any legal decisions C) Drink any alcoholic beverage   2) You may resume regular meals tomorrow.  Today it is better to start with liquids and gradually work up to solid foods.  You may eat anything you prefer, but it is better to start with liquids, then soup and crackers, and gradually work up to solid foods.   3) Please notify your doctor immediately if you have any unusual bleeding, trouble breathing, redness and pain at the surgery site, drainage, fever, or pain not relieved by medication.    4) Additional Instructions: TAKE A STOOL SOFTENER TWICE A DAY WHILE TAKING NARCOTIC PAIN MEDICINE TO PREVENT CONSTIPATION   Please contact your physician with any problems or Same Day Surgery at [phone], Monday through Friday 6 am to 4 pm, or [place] at [facility] number at [phone].

## 2018-12-18 NOTE — H&P (Signed)
   12/18/2018 7:09 AM   John [name] 09/03/1961 [id number]  Referring provider: No referring provider defined for this encounter.   HPI: 58 year old male with recurrent stone disease who on recent imaging found to have a 13 mm bladder calculus which he has been unable to pass.  He presents for cystoscopy with cystolitholapaxy.  He denies bothersome lower urinary tract symptoms and declines prostate outlet procedure.   PMH: Past Medical History:  Diagnosis Date  . History of kidney stones   . Hyperlipidemia   . Hypertension     Surgical History: Past Surgical History:  Procedure Laterality Date  . BACK SURGERY    . CHOLECYSTECTOMY    . CYSTOSCOPY/URETEROSCOPY/HOLMIUM LASER/STENT PLACEMENT Left 10/12/2017   Procedure: CYSTOSCOPY/URETEROSCOPY/HOLMIUM LASER/STENT PLACEMENT;  Surgeon: Riki Altes, MD;  Location: [facility] ORS;  Service: Urology;  Laterality: Left;  . LUMBAR FUSION     L5S1  . SHOULDER ARTHROSCOPY WITH SUBACROMIAL DECOMPRESSION Right 10/27/2016   Procedure: SHOULDER ARTHROSCOPY WITH SUBACROMIAL DECOMPRESSION  AND DEBRIDEMENT;  Surgeon: Christena Flake, MD;  Location: [facility] ORS;  Service: Orthopedics;  Laterality: Right;  . SHOULDER SURGERY Right 2003    Home Medications:    allopurinol 100 MG tablet Commonly known as:  ZYLOPRIM   atorvastatin 10 MG tablet Commonly known as:  LIPITOR Take 10 mg by mouth daily.   Colchicine 0.6 MG Caps Take by mouth as needed.   cyclobenzaprine 10 MG tablet Commonly known as:  FLEXERIL Take 10 mg by mouth daily as needed.   fluticasone 50 MCG/ACT nasal spray Commonly known as:  FLONASE U 1 SPRAY IEN BID   losartan 25 MG tablet Commonly known as:  COZAAR Take 1 tablet by mouth daily.   tamsulosin 0.4 MG Caps capsule Commonly known as:  FLOMAX TK ONE C PO QHS     Allergies: Not on File  Family History: Family History  Problem Relation Age of Onset  . Heart disease Father   . Prostate cancer Neg Hx   .  Bladder Cancer Neg Hx   . Kidney cancer Neg Hx     Social History:  reports that he has never smoked. His smokeless tobacco use includes snuff. He reports that he does not drink alcohol or use drugs.  ROS: No significant change from 10/31/2018  Physical Exam: There were no vitals taken for this visit.  Constitutional:  Alert and oriented, No acute distress. HEENT: [place] AT, moist mucus membranes.  Trachea midline, no masses. Cardiovascular: No clubbing, cyanosis, or edema.  RRR Respiratory: Normal respiratory effort, no increased work of breathing.  Clear GI: Abdomen is soft, nontender, nondistended, no abdominal masses GU: No CVA tenderness Lymph: No cervical or inguinal lymphadenopathy. Skin: No rashes, bruises or suspicious lesions. Neurologic: Grossly intact, no focal deficits, moving all 4 extremities. Psychiatric: Normal mood and affect.  Assessment & Plan:   58 year old male with a 13 mm bladder calculus who presents for cystolitholapaxy.  The procedure has been discussed in detail including potential risks of bleeding, infection and bladder injury as well as anesthetic risk.  He indicated all questions were answered and desires to proceed.   Riki Altes, MD  [facility] [address] [place], [place] [address] [phone]

## 2018-12-18 NOTE — Transfer of Care (Signed)
Immediate Anesthesia Transfer of Care Note  Patient: John [name]  Procedure(s) Performed: CYSTOSCOPY WITH LITHOLAPAXY (N/A )  Patient Location: PACU  Anesthesia Type:General  Level of Consciousness: awake, alert  and oriented  Airway & Oxygen Therapy: Patient Spontanous Breathing and Patient connected to face mask oxygen  Post-op Assessment: Report given to RN and Post -op Vital signs reviewed and stable  Post vital signs: Reviewed and stable  Last Vitals:  Vitals Value Taken Time  BP 114/77 12/18/2018  8:52 AM  Temp    Pulse 89 12/18/2018  8:52 AM  Resp 14 12/18/2018  8:52 AM  SpO2 95 % 12/18/2018  8:52 AM  Vitals shown include unvalidated device data.  Last Pain:  Vitals:   12/18/18 0716  TempSrc: Oral  PainSc: 0-No pain         Complications: No apparent anesthesia complications

## 2018-12-18 NOTE — Anesthesia Postprocedure Evaluation (Signed)
Anesthesia Post Note  Patient: John [name]  Procedure(s) Performed: CYSTOSCOPY WITH LITHOLAPAXY (N/A )  Patient location during evaluation: PACU Anesthesia Type: General Level of consciousness: awake and alert and oriented Pain management: pain level controlled Vital Signs Assessment: post-procedure vital signs reviewed and stable Respiratory status: spontaneous breathing Cardiovascular status: blood pressure returned to baseline Anesthetic complications: no     Last Vitals:  Vitals:   12/18/18 0930 12/18/18 0939  BP: 102/77 101/68  Pulse: 78 70  Resp: 10 16  Temp:  36.5 C  SpO2: 93% 97%    Last Pain:  Vitals:   12/18/18 0939  TempSrc: Oral  PainSc: 0-No pain                 [name]

## 2018-12-18 NOTE — Op Note (Signed)
Preoperative diagnosis:  1. Bladder calculus  Postoperative diagnosis:  1. Bladder calculus  Procedure: 1. Cystolitholapaxy (< 2.5 cm)  Surgeon: Riki Altes, MD  Anesthesia: General  Complications: None  Intraoperative findings: 15 mm bladder calculus  EBL: Minimal  Specimens: None  Indication: John [name] is a 58 y.o. patient with a history of recurrent stone disease.  On recent imaging he was found to have an approximately 15 mm bladder calculus.  He is asymptomatic.  Denies bothersome lower urinary tract symptoms and declined outlet procedure.  After reviewing the management options for treatment, he elected to proceed with the above surgical procedure(s). We have discussed the potential benefits and risks of the procedure, side effects of the proposed treatment, the likelihood of the patient achieving the goals of the procedure, and any potential problems that might occur during the procedure or recuperation. Informed consent has been obtained.  Description of procedure:  The patient was taken to the operating room and general anesthesia was induced.  The patient was placed in the dorsal lithotomy position, prepped and draped in the usual sterile fashion, and preoperative antibiotics were administered. A preoperative time-out was performed.   A 26 French continuous-flow resectoscope sheath with visual obturator was lubricated and was unable to be inserted per urethra.  The distal urethra was dilated with Sissy Hoff sounds from 22-30 [place] without difficulty.  The resectoscope sheath was easily inserted.  The urethra was normal in caliber at stricture.  Prostate remarkable for moderate lateral lobe enlargement and mild bladder neck elevation.  Once in the bladder at the obturator was replaced with a laser bridge.  The stone was identified in the dependent portion of the bladder.  No bladder mucosal abnormalities were noted.  The ureteral orifices were normal appearing with clear  efflux.  A 550 m holmium laser fiber was placed through the laser bridge and the stone was initially dusted at a setting of 0.2 J / 20 Hz.  It was then lasered into 3 fragments which were removed with irrigation.  All smaller fragments were removed via irrigation.  No significant bleeding was noted.  The resectoscope sheath was removed.  After anesthetic reversal patient was transported to the PACU in stable condition.  Riki Altes, M.D.

## 2018-12-18 NOTE — Anesthesia Preprocedure Evaluation (Signed)
Anesthesia Evaluation  Patient identified by MRN, date of birth, ID band Patient awake    Reviewed: Allergy & Precautions, H&P , NPO status , Patient's Chart, lab work & pertinent test results, reviewed documented beta blocker date and time   Airway Mallampati: II  TM Distance: >3 FB Neck ROM: full    Dental  (+) Teeth Intact   Pulmonary neg pulmonary ROS,    Pulmonary exam normal        Cardiovascular Exercise Tolerance: Good hypertension, negative cardio ROS Normal cardiovascular exam Rate:Normal     Neuro/Psych  Neuromuscular disease negative psych ROS   GI/Hepatic negative GI ROS, Neg liver ROS,   Endo/Other  negative endocrine ROS  Renal/GU Renal diseasenegative Renal ROS  negative genitourinary   Musculoskeletal  (+) Arthritis , Osteoarthritis,    Abdominal   Peds  Hematology negative hematology ROS (+)   Anesthesia Other Findings   Reproductive/Obstetrics negative OB ROS                             Anesthesia Physical  Anesthesia Plan  ASA: II  Anesthesia Plan: General LMA   Post-op Pain Management:    Induction:   PONV Risk Score and Plan: 3  Airway Management Planned: LMA  Additional Equipment:   Intra-op Plan:   Post-operative Plan: Extubation in OR  Informed Consent: I have reviewed the patients History and Physical, chart, labs and discussed the procedure including the risks, benefits and alternatives for the proposed anesthesia with the patient or authorized representative who has indicated his/her understanding and acceptance.       Plan Discussed with: CRNA  Anesthesia Plan Comments:         Anesthesia Quick Evaluation

## 2018-12-18 NOTE — Interval H&P Note (Signed)
History and Physical Interval Note:  12/18/2018 7:31 AM  John [name]  has presented today for surgery, with the diagnosis of bladder calculus.  The various methods of treatment have been discussed with the patient and family. After consideration of risks, benefits and other options for treatment, the patient has consented to  Procedure(s): CYSTOSCOPY WITH LITHOLAPAXY (N/A) as a surgical intervention.  The patient's history has been reviewed, patient examined, no change in status, stable for surgery.  I have reviewed the patient's chart and labs.  Questions were answered to the patient's satisfaction.     Brick Ketcher C Guynell Kleiber

## 2018-12-18 NOTE — Anesthesia Procedure Notes (Signed)
Procedure Name: LMA Insertion Date/Time: 12/18/2018 8:35 AM Performed by: Danelle Berry, CRNA Pre-anesthesia Checklist: Patient identified, Emergency Drugs available, Suction available, Patient being monitored and Timeout performed Patient Re-evaluated:Patient Re-evaluated prior to induction Oxygen Delivery Method: Circle system utilized and Simple face mask Preoxygenation: Pre-oxygenation with 100% oxygen Induction Type: IV induction Ventilation: Mask ventilation without difficulty LMA Size: 4.0

## 2018-12-19 LAB — URINE CULTURE: Culture: NO GROWTH

## 2019-01-16 ENCOUNTER — Telehealth (INDEPENDENT_AMBULATORY_CARE_PROVIDER_SITE_OTHER): Payer: BLUE CROSS/BLUE SHIELD | Admitting: Urology

## 2019-01-16 ENCOUNTER — Other Ambulatory Visit: Payer: Self-pay

## 2019-01-16 DIAGNOSIS — N200 Calculus of kidney: Secondary | ICD-10-CM | POA: Diagnosis not present

## 2019-01-16 MED ORDER — NAPROXEN 500 MG PO TABS: 500.0000 mg | ORAL_TABLET | Freq: Two times a day (BID) | ORAL | 0 refills | Status: DC

## 2019-01-16 NOTE — Progress Notes (Signed)
Virtual Visit via Video Note  I connected with John [name] on 01/16/19 at  9:00 AM EDT by a video enabled telemedicine application and verified that I am speaking with the correct person using two identifiers.   I discussed the limitations of evaluation and management by telemedicine and the availability of in person appointments. The patient expressed understanding and agreed to proceed.  History of Present Illness: 58 year old male for the visit secondary to COVID-19 pandemic.  He is status post cystolitholapaxy on 12/18/2018.  He had no postoperative problems and states he is doing well.  He does have a history of recurrent stone disease and bilateral nephrolithiasis and currently denies flank or abdominal pain.  He also has a history of gout and is on allopurinol.  He states he is currently experiencing a flare in his great toe and was inquiring about options for pain management.   Observations/Objective: Alert in no acute distress  Assessment and Plan: 58 year old male doing well status post cystolitholapaxy.  He has bilateral nephrolithiasis and is currently asymptomatic.  I have recommended a follow-up appointment with a KUB in 6 months.  Rx naproxen 500 mg twice daily was sent to pharmacy.  A 3-day course was sent in until he can obtain follow-up with his PCP.  Follow Up Instructions: As above.   I discussed the assessment and treatment plan with the patient. The patient was provided an opportunity to ask questions and all were answered. The patient agreed with the plan and demonstrated an understanding of the instructions.   The patient was advised to call back or seek an in-person evaluation if the symptoms worsen or if the condition fails to improve as anticipated.  I provided 10 minutes of non-face-to-face time during this encounter.   Riki Altes, MD

## 2019-01-18 ENCOUNTER — Ambulatory Visit: Payer: BLUE CROSS/BLUE SHIELD | Admitting: Urology

## 2019-05-27 DIAGNOSIS — Z981 Arthrodesis status: Secondary | ICD-10-CM | POA: Diagnosis not present

## 2019-05-27 DIAGNOSIS — M5136 Other intervertebral disc degeneration, lumbar region: Secondary | ICD-10-CM | POA: Diagnosis not present

## 2019-05-27 DIAGNOSIS — M5137 Other intervertebral disc degeneration, lumbosacral region: Secondary | ICD-10-CM | POA: Diagnosis not present

## 2019-06-18 ENCOUNTER — Telehealth: Payer: Self-pay | Admitting: Urology

## 2019-06-18 DIAGNOSIS — N200 Calculus of kidney: Secondary | ICD-10-CM

## 2019-06-18 MED ORDER — TAMSULOSIN HCL 0.4 MG PO CAPS: 0.4000 mg | ORAL_CAPSULE | Freq: Every day | ORAL | 2 refills | Status: DC

## 2019-06-18 NOTE — Telephone Encounter (Signed)
RX sent

## 2019-06-18 NOTE — Telephone Encounter (Signed)
Pt. [place] for refill and was told they have sent to request over to our office with no response. Pt's wife would like refill called in to pharmacy on file [facility] in [place]. If the medication can not be filled for any reason she would like a call back.

## 2019-07-18 ENCOUNTER — Other Ambulatory Visit: Payer: Self-pay

## 2019-07-18 ENCOUNTER — Ambulatory Visit: Payer: Self-pay

## 2019-07-18 DIAGNOSIS — Z23 Encounter for immunization: Secondary | ICD-10-CM

## 2019-07-18 DIAGNOSIS — Z0000 Encounter for general adult medical examination without abnormal findings: Secondary | ICD-10-CM

## 2019-07-18 LAB — POCT URINALYSIS DIPSTICK
Bilirubin, UA: NEGATIVE
Blood, UA: POSITIVE
Glucose, UA: NEGATIVE
Ketones, UA: NEGATIVE
Leukocytes, UA: NEGATIVE
Nitrite, UA: NEGATIVE
Protein, UA: NEGATIVE
Spec Grav, UA: >=1.03 — AB (ref 1.010–1.025)
Urobilinogen, UA: 0.2 E.U./dL
pH, UA: 6 (ref 5.0–8.0)

## 2019-07-19 ENCOUNTER — Ambulatory Visit: Admission: RE | Admit: 2019-07-19 | Payer: 59 | Source: Ambulatory Visit

## 2019-07-19 ENCOUNTER — Ambulatory Visit
Admission: RE | Admit: 2019-07-19 | Discharge: 2019-07-19 | Disposition: A | Discharge status: Home / Self Care | Payer: 59 | Source: Ambulatory Visit | Attending: Urology | Admitting: Urology

## 2019-07-19 ENCOUNTER — Ambulatory Visit (INDEPENDENT_AMBULATORY_CARE_PROVIDER_SITE_OTHER): Payer: 59 | Admitting: Urology

## 2019-07-19 ENCOUNTER — Encounter: Payer: Self-pay | Admitting: Urology

## 2019-07-19 VITALS — BP 117/53 | HR 80 | Ht 71.0 in | Wt 226.0 lb

## 2019-07-19 DIAGNOSIS — N200 Calculus of kidney: Secondary | ICD-10-CM

## 2019-07-19 LAB — CMP12+LP+TP+TSH+6AC+PSA+CBC?
ALT: 51 IU/L — ABNORMAL HIGH (ref 0–44)
Alkaline Phosphatase: 94 IU/L (ref 39–117)
BUN: 12 mg/dL (ref 6–24)
Bilirubin Total: 1.6 mg/dL — ABNORMAL HIGH (ref 0.0–1.2)
Cholesterol, Total: 138 mg/dL (ref 100–199)
EOS (ABSOLUTE): 0.2 10*3/uL (ref 0.0–0.4)
Immature Grans (Abs): 0.1 10*3/uL (ref 0.0–0.1)
Immature Granulocytes: 1 %
Prostate Specific Ag, Serum: 1.5 ng/mL (ref 0.0–4.0)
Uric Acid: 5.6 mg/dL (ref 3.7–8.6)
WBC: 8.7 10*3/uL (ref 3.4–10.8)

## 2019-07-19 LAB — CMP12+LP+TP+TSH+6AC+PSA+CBC…
AST: 29 IU/L (ref 0–40)
Albumin/Globulin Ratio: 1.9 (ref 1.2–2.2)
Albumin: 4.5 g/dL (ref 3.8–4.9)
BUN/Creatinine Ratio: 11 (ref 9–20)
Basophils Absolute: 0 10*3/uL (ref 0.0–0.2)
Basos: 1 %
Calcium: 9.5 mg/dL (ref 8.7–10.2)
Chloride: 103 mmol/L (ref 96–106)
Chol/HDL Ratio: 4.2 ratio (ref 0.0–5.0)
Creatinine, Ser: 1.11 mg/dL (ref 0.76–1.27)
Eos: 2 %
Estimated CHD Risk: 0.8 times avg. (ref 0.0–1.0)
Free Thyroxine Index: 3.2 (ref 1.2–4.9)
GFR calc Af Amer: 84 mL/min/{1.73_m2} (ref >59)
GFR calc non Af Amer: 73 mL/min/{1.73_m2} (ref >59)
GGT: 42 IU/L (ref 0–65)
Globulin, Total: 2.4 g/dL (ref 1.5–4.5)
Glucose: 103 mg/dL — ABNORMAL HIGH (ref 65–99)
HDL: 33 mg/dL — ABNORMAL LOW (ref >39)
Hematocrit: 50.7 % (ref 37.5–51.0)
Hemoglobin: 16.9 g/dL (ref 13.0–17.7)
Iron: 84 ug/dL (ref 38–169)
LDH: 174 IU/L (ref 121–224)
LDL Chol Calc (NIH): 77 mg/dL (ref 0–99)
Lymphocytes Absolute: 1.6 10*3/uL (ref 0.7–3.1)
Lymphs: 19 %
MCH: 28.8 pg (ref 26.6–33.0)
MCHC: 33.3 g/dL (ref 31.5–35.7)
MCV: 86 fL (ref 79–97)
Monocytes Absolute: 0.8 10*3/uL (ref 0.1–0.9)
Monocytes: 9 %
Neutrophils Absolute: 6 10*3/uL (ref 1.4–7.0)
Neutrophils: 68 %
Phosphorus: 3.1 mg/dL (ref 2.8–4.1)
Platelets: 295 10*3/uL (ref 150–450)
Potassium: 4.7 mmol/L (ref 3.5–5.2)
RBC: 5.87 x10E6/uL — ABNORMAL HIGH (ref 4.14–5.80)
RDW: 15.1 % (ref 11.6–15.4)
Sodium: 139 mmol/L (ref 134–144)
T3 Uptake Ratio: 38 % (ref 24–39)
T4, Total: 8.3 ug/dL (ref 4.5–12.0)
TSH: 1.99 u[IU]/mL (ref 0.450–4.500)
Total Protein: 6.9 g/dL (ref 6.0–8.5)
Triglycerides: 160 mg/dL — ABNORMAL HIGH (ref 0–149)
VLDL Cholesterol Cal: 28 mg/dL (ref 5–40)

## 2019-07-19 NOTE — Progress Notes (Signed)
07/19/2019 10:22 AM   John [name] Feb 09, 1961 [id number]  Referring provider: Albina Billet, MD [address]   [place],  [place] [address]  Chief Complaint  Patient presents with  . Follow-up    HPI: 58 y.o. male presents for follow-up of nephrolithiasis.  He was found to have a 13 mm bladder calculus February 2020 and underwent cystolitholapaxy.  He states he has been doing well.  He has bilateral nephrolithiasis but has been asymptomatic since his last visit.  He has no complaints today.  KUB performed today was reviewed and there are bilateral renal calculi.  No abnormal calcifications overlying the expected course of the ureters or bladder.   PMH: Past Medical History:  Diagnosis Date  . History of kidney stones   . Hyperlipidemia   . Hypertension     Surgical History: Past Surgical History:  Procedure Laterality Date  . BACK SURGERY    . CHOLECYSTECTOMY    . CYSTOSCOPY WITH LITHOLAPAXY N/A 12/18/2018   Procedure: CYSTOSCOPY WITH LITHOLAPAXY;  Surgeon: Abbie Sons, MD;  Location: [facility] ORS;  Service: Urology;  Laterality: N/A;  . CYSTOSCOPY/URETEROSCOPY/HOLMIUM LASER/STENT PLACEMENT Left 10/12/2017   Procedure: CYSTOSCOPY/URETEROSCOPY/HOLMIUM LASER/STENT PLACEMENT;  Surgeon: Abbie Sons, MD;  Location: [facility] ORS;  Service: Urology;  Laterality: Left;  . LUMBAR FUSION     L5S1  . SHOULDER ARTHROSCOPY WITH SUBACROMIAL DECOMPRESSION Right 10/27/2016   Procedure: SHOULDER ARTHROSCOPY WITH SUBACROMIAL DECOMPRESSION  AND DEBRIDEMENT;  Surgeon: Corky Mull, MD;  Location: [facility] ORS;  Service: Orthopedics;  Laterality: Right;  . SHOULDER SURGERY Right 2003    Home Medications:  Allergies as of 07/19/2019   No Known Allergies     Medication List       Accurate as of July 19, 2019 10:22 AM. If you have any questions, ask your nurse or doctor.        STOP taking these medications   gabapentin 300 MG capsule Commonly known as: NEURONTIN Stopped by:  Abbie Sons, MD   naproxen 500 MG tablet Commonly known as: NAPROSYN Stopped by: Abbie Sons, MD     TAKE these medications   allopurinol 100 MG tablet Commonly known as: ZYLOPRIM Take 200 mg by mouth at bedtime.   atorvastatin 10 MG tablet Commonly known as: LIPITOR Take 10 mg by mouth at bedtime.   losartan 25 MG tablet Commonly known as: COZAAR Take 1 tablet by mouth at bedtime.   tamsulosin 0.4 MG Caps capsule Commonly known as: FLOMAX Take 1 capsule (0.4 mg total) by mouth at bedtime.       Allergies: No Known Allergies  Family History: Family History  Problem Relation Age of Onset  . Heart disease Father   . Prostate cancer Neg Hx   . Bladder Cancer Neg Hx   . Kidney cancer Neg Hx     Social History:  reports that he has never smoked. His smokeless tobacco use includes snuff. He reports that he does not drink alcohol or use drugs.  ROS: UROLOGY Frequent Urination?: No Hard to postpone urination?: No Burning/pain with urination?: No Get up at night to urinate?: No Leakage of urine?: No Urine stream starts and stops?: No Trouble starting stream?: No Do you have to strain to urinate?: No Blood in urine?: No Urinary tract infection?: No Sexually transmitted disease?: No Injury to kidneys or bladder?: No Painful intercourse?: No Weak stream?: No Erection problems?: No Penile pain?: No  Gastrointestinal Nausea?: No Vomiting?: No  Indigestion/heartburn?: No Diarrhea?: No Constipation?: No  Constitutional Fever: No Night sweats?: No Weight loss?: No Fatigue?: No  Skin Skin rash/lesions?: No Itching?: No  Eyes Blurred vision?: No Double vision?: No  Ears/Nose/Throat Sore throat?: No Sinus problems?: No  Hematologic/Lymphatic Swollen glands?: No Easy bruising?: No  Cardiovascular Leg swelling?: No Chest pain?: No  Respiratory Cough?: No Shortness of breath?: No  Endocrine Excessive thirst?: No  Musculoskeletal Back  pain?: No Joint pain?: No  Neurological Headaches?: No Dizziness?: No  Psychologic Depression?: No Anxiety?: No  Physical Exam: BP (!) 117/53   Pulse 80   Ht 5\' 11"  (1.803 m)   Wt 226 lb (102.5 kg)   BMI 31.52 kg/m   Constitutional:  Alert and oriented, No acute distress. HEENT: [place] AT, moist mucus membranes.  Trachea midline, no masses. Cardiovascular: No clubbing, cyanosis, or edema. Respiratory: Normal respiratory effort, no increased work of breathing. GI: Abdomen is soft, nontender, nondistended, no abdominal masses GU: No CVA tenderness Lymph: No cervical or inguinal lymphadenopathy. Skin: No rashes, bruises or suspicious lesions. Neurologic: Grossly intact, no focal deficits, moving all 4 extremities. Psychiatric: Normal mood and affect.  Pertinent Imaging: KUB today personally reviewed   Assessment & Plan:    - Nephrolithiasis Bilateral renal calculi, nonobstructing and asymptomatic.  Follow-up 1 year with KUB.  Instructed to call earlier for development of renal colic.  He had previously been on potassium citrate however could not tolerate.  I did recommend starting LithoLyte and he was given an order form.  - Prostate cancer screening He had blood drawn yesterday for his annual physical and PSA was 1.5.   , MD  [facility] [address], [address] [place], [place] [phone]

## 2019-07-21 ENCOUNTER — Encounter: Payer: Self-pay | Admitting: Urology

## 2019-07-31 ENCOUNTER — Ambulatory Visit: Payer: Self-pay | Admitting: Registered Nurse

## 2019-07-31 ENCOUNTER — Other Ambulatory Visit: Payer: Self-pay

## 2019-07-31 ENCOUNTER — Encounter: Payer: Self-pay | Admitting: Registered Nurse

## 2019-07-31 VITALS — BP 132/70 | HR 86 | Temp 98.4°F | Ht 73.0 in | Wt 227.0 lb

## 2019-07-31 DIAGNOSIS — R7301 Impaired fasting glucose: Secondary | ICD-10-CM

## 2019-07-31 DIAGNOSIS — R7989 Other specified abnormal findings of blood chemistry: Secondary | ICD-10-CM

## 2019-07-31 DIAGNOSIS — E785 Hyperlipidemia, unspecified: Secondary | ICD-10-CM

## 2019-07-31 DIAGNOSIS — Z0289 Encounter for other administrative examinations: Secondary | ICD-10-CM

## 2019-07-31 DIAGNOSIS — Z6829 Body mass index (BMI) 29.0-29.9, adult: Secondary | ICD-10-CM

## 2019-07-31 LAB — POCT URINALYSIS DIPSTICK
Bilirubin, UA: NEGATIVE
Blood, UA: POSITIVE
Glucose, UA: NEGATIVE
Ketones, UA: NEGATIVE
Leukocytes, UA: NEGATIVE
Nitrite, UA: NEGATIVE
Protein, UA: NEGATIVE
Spec Grav, UA: 1.025 (ref 1.010–1.025)
Urobilinogen, UA: 0.2 E.U./dL
pH, UA: 6 (ref 5.0–8.0)

## 2019-07-31 NOTE — Progress Notes (Signed)
Eye Exam Rt = 20/15 Lt = 20/15 Bt =  20/15  States had appt with Dr. Bernardo Heater (Urologist) day after here for labs & has some kidney stones floating.  AMD

## 2019-07-31 NOTE — Patient Instructions (Signed)
Preventing Health Risks of Being Overweight Maintaining a healthy body weight is an important part of your overall health. Your healthy body weight depends on your age, gender, and height. Being overweight puts you at risk for many health problems, including:  Heart disease.  Diabetes.  Problems sleeping.  Joint problems. You can make changes to your diet and lifestyle to prevent these risks. Consider working with a health care provider or a dietitian to make these changes. What nutrition changes can be made?   Eat only as much as your body needs. In most cases, this is about 2,000 calories a day, but the amount varies depending on your height, gender, and activity level. Ask your health care provider how many calories you should have each day. Eating more than your body needs on a regular basis can cause you to become overweight or obese.  Eat slowly, and stop eating when you feel full.  Choose healthy foods, including: ? Fruits and vegetables. ? Lean meats. ? Low-fat dairy products. ? High-fiber foods, such as whole grains and beans. ? Healthy snacks like vegetable sticks, a piece of fruit, or a small amount of yogurt or cheese.  Avoid foods and drinks that are high in sugar, salt (sodium), saturated fat, or trans fat. This includes: ? Many desserts such as candy, cookies, and ice cream. ? Soda. ? Fried foods. ? Processed meats such as hot dogs or lunch meats. ? Prepackaged snack foods. What lifestyle changes can be made?   Exercise for at least 150 minutes a week to prevent weight gain, or as often as recommended by your health care provider. Do moderate-intensity exercise, such as brisk walking. ? Spread it out by exercising for 30 minutes 5 days a week, or in short 10-minute bursts several times a day.  Find other ways to stay active and burn calories, such as yard work or a hobby that involves physical activity.  Get at least 8 hours of sleep each night. When you are  well-rested, you are more likely to be active and make healthy choices during the day. To sleep better: ? Try to go to bed and wake up at about the same time every day. ? Keep your bedroom dark, quiet, and cool. ? Make sure that your bed is comfortable. ? Avoid stimulating activities, such as watching television or exercising, for at least one hour before bedtime. Why are these changes important? Eating healthy and being active helps you lose weight and prevent health problems caused by being overweight. Making these changes can also help you manage stress, feel better mentally, and connect with friends and family. What can happen if changes are not made? Being overweight can affect you for your entire life. You may develop joint or bone problems that make it painful or difficult for you to play sports or do activities you enjoy. Being overweight puts stress on your heart and lungs and can lead to medical problems like diabetes, heart disease, and sleeping problems. Where to find support You can get support for preventing health risks of being overweight from:  Your health care provider or a dietitian. They can provide guidance about healthy eating and healthy lifestyle choices.  Weight loss support groups, online or in-person. Where to find more information  MyPlate: FormerBoss.no ? This an online tool that provides personalized recommendations about foods to eat each day.  The Centers for Disease Control and Prevention: http://sharp-hammond.biz/ ? This resource gives tips for managing weight and having an active lifestyle.  Summary  To prevent unhealthy weight gain, it is important to maintain a healthy diet high in vegetables and whole grains, exercise regularly, and get at least 8 hours of sleep each night.  Making these changes helps prevent many long-term (chronic) health conditions that can shorten your life, such as diabetes, heart disease, and stroke. This information is  not intended to replace advice given to you by your health care provider. Make sure you discuss any questions you have with your health care provider. Document Released: 08/16/2017 Document Revised: 09/22/2017 Document Reviewed: 08/16/2017 Elsevier Patient Education  2020 [employer] Inc. High Cholesterol  High cholesterol is a condition in which the blood has high levels of a white, waxy, fat-like substance (cholesterol). The human body needs small amounts of cholesterol. The liver makes all the cholesterol that the body needs. Extra (excess) cholesterol comes from the food that we eat. Cholesterol is carried from the liver by the blood through the blood vessels. If you have high cholesterol, deposits (plaques) may build up on the walls of your blood vessels (arteries). Plaques make the arteries narrower and stiffer. Cholesterol plaques increase your risk for heart attack and stroke. Work with your health care provider to keep your cholesterol levels in a healthy range. What increases the risk? This condition is more likely to develop in people who:  Eat foods that are high in animal fat (saturated fat) or cholesterol.  Are overweight.  Are not getting enough exercise.  Have a family history of high cholesterol. What are the signs or symptoms? There are no symptoms of this condition. How is this diagnosed? This condition may be diagnosed from the results of a blood test.  If you are older than age 58, your health care provider may check your cholesterol every 4-6 years.  You may be checked more often if you already have high cholesterol or other risk factors for heart disease. The blood test for cholesterol measures:  "Bad" cholesterol (LDL cholesterol). This is the main type of cholesterol that causes heart disease. The desired level for LDL is less than 100.  "Good" cholesterol (HDL cholesterol). This type helps to protect against heart disease by cleaning the arteries and carrying the  LDL away. The desired level for HDL is 60 or higher.  Triglycerides. These are fats that the body can store or burn for energy. The desired number for triglycerides is lower than 150.  Total cholesterol. This is a measure of the total amount of cholesterol in your blood, including LDL cholesterol, HDL cholesterol, and triglycerides. A healthy number is less than 200. How is this treated? This condition is treated with diet changes, lifestyle changes, and medicines. Diet changes  This may include eating more whole grains, fruits, vegetables, nuts, and fish.  This may also include cutting back on red meat and foods that have a lot of added sugar. Lifestyle changes  Changes may include getting at least 40 minutes of aerobic exercise 3 times a week. Aerobic exercises include walking, biking, and swimming. Aerobic exercise along with a healthy diet can help you maintain a healthy weight.  Changes may also include quitting smoking. Medicines  Medicines are usually given if diet and lifestyle changes have failed to reduce your cholesterol to healthy levels.  Your health care provider may prescribe a statin medicine. Statin medicines have been shown to reduce cholesterol, which can reduce the risk of heart disease. Follow these instructions at home: Eating and drinking If told by your health care provider:  Eat chicken (without skin), fish, veal, shellfish, ground [place] breast, and round or loin cuts of red meat.  Do not eat fried foods or fatty meats, such as hot dogs and salami.  Eat plenty of fruits, such as apples.  Eat plenty of vegetables, such as broccoli, potatoes, and carrots.  Eat beans, peas, and lentils.  Eat grains such as barley, rice, couscous, and bulgur wheat.  Eat pasta without cream sauces.  Use skim or nonfat milk, and eat low-fat or nonfat yogurt and cheeses.  Do not eat or drink whole milk, cream, ice cream, egg yolks, or hard cheeses.  Do not eat stick  margarine or tub margarines that contain trans fats (also called partially hydrogenated oils).  Do not eat saturated tropical oils, such as coconut oil and palm oil.  Do not eat cakes, cookies, crackers, or other baked goods that contain trans fats.  General instructions  Exercise as directed by your health care provider. Increase your activity level with activities such as gardening, walking, and taking the stairs.  Take over-the-counter and prescription medicines only as told by your health care provider.  Do not use any products that contain nicotine or tobacco, such as cigarettes and e-cigarettes. If you need help quitting, ask your health care provider.  Keep all follow-up visits as told by your health care provider. This is important. Contact a health care provider if:  You are struggling to maintain a healthy diet or weight.  You need help to start on an exercise program.  You need help to stop smoking. Get help right away if:  You have chest pain.  You have trouble breathing. This information is not intended to replace advice given to you by your health care provider. Make sure you discuss any questions you have with your health care provider. Document Released: 09/19/2005 Document Revised: 09/22/2017 Document Reviewed: 03/19/2016 Elsevier Patient Education  2020 [employer].    Why follow it? Research shows. . Those who follow the Mediterranean diet have a reduced risk of heart disease  . The diet is associated with a reduced incidence of Parkinson's and Alzheimer's diseases . People following the diet may have longer life expectancies and lower rates of chronic diseases  . The Dietary Guidelines for Americans recommends the Mediterranean diet as an eating plan to promote health and prevent disease  What Is the Mediterranean Diet?  . Healthy eating plan based on typical foods and recipes of Mediterranean-style cooking . The diet is primarily a plant based diet; these  foods should make up a majority of meals   Starches - Plant based foods should make up a majority of meals - They are an important sources of vitamins, minerals, energy, antioxidants, and fiber - Choose whole grains, foods high in fiber and minimally processed items  - Typical grain sources include wheat, oats, barley, corn, brown rice, bulgar, farro, millet, polenta, couscous  - Various types of beans include chickpeas, lentils, fava beans, black beans, white beans   Fruits  Veggies - Large quantities of antioxidant rich fruits & veggies; 6 or more servings  - Vegetables can be eaten raw or lightly drizzled with oil and cooked  - Vegetables common to the traditional Mediterranean Diet include: artichokes, arugula, beets, broccoli, brussel sprouts, cabbage, carrots, celery, collard greens, cucumbers, eggplant, kale, leeks, lemons, lettuce, mushrooms, okra, onions, peas, peppers, potatoes, pumpkin, radishes, rutabaga, shallots, spinach, sweet potatoes, turnips, zucchini - Fruits common to the Mediterranean Diet include: apples, apricots, avocados, cherries, clementines, dates,  figs, grapefruits, grapes, melons, nectarines, oranges, peaches, pears, pomegranates, strawberries, tangerines  Fats - Replace butter and margarine with healthy oils, such as olive oil, canola oil, and tahini  - Limit nuts to no more than a handful a day  - Nuts include walnuts, almonds, pecans, pistachios, pine nuts  - Limit or avoid candied, honey roasted or heavily salted nuts - Olives are central to the Mediterranean diet - can be eaten whole or used in a variety of dishes   Meats Protein - Limiting red meat: no more than a few times a month - When eating red meat: choose lean cuts and keep the portion to the size of deck of cards - Eggs: approx. 0 to 4 times a week  - Fish and lean poultry: at least 2 a week  - Healthy protein sources include, chicken, [place], lean beef, lamb - Increase intake of seafood such as tuna,  salmon, trout, mackerel, shrimp, scallops - Avoid or limit high fat processed meats such as sausage and bacon  Dairy - Include moderate amounts of low fat dairy products  - Focus on healthy dairy such as fat free yogurt, skim milk, low or reduced fat cheese - Limit dairy products higher in fat such as whole or 2% milk, cheese, ice cream  Alcohol - Moderate amounts of red wine is ok  - No more than 5 oz daily for women (all ages) and men older than age 7  - No more than 10 oz of wine daily for men younger than 50  Other - Limit sweets and other desserts  - Use herbs and spices instead of salt to flavor foods  - Herbs and spices common to the traditional Mediterranean Diet include: basil, bay leaves, chives, cloves, cumin, fennel, garlic, lavender, marjoram, mint, oregano, parsley, pepper, rosemary, sage, savory, sumac, tarragon, thyme   It's not just a diet, it's a lifestyle:  . The Mediterranean diet includes lifestyle factors typical of those in the region  . Foods, drinks and meals are best eaten with others and savored . Daily physical activity is important for overall good health . This could be strenuous exercise like running and aerobics . This could also be more leisurely activities such as walking, housework, yard-work, or taking the stairs . Moderation is the key; a balanced and healthy diet accommodates most foods and drinks . Consider portion sizes and frequency of consumption of certain foods   Meal Ideas & Options:  . Breakfast:  o Whole wheat toast or whole wheat English muffins with peanut butter & hard boiled egg o Steel cut oats topped with apples & cinnamon and skim milk  o Fresh fruit: banana, strawberries, melon, berries, peaches  o Smoothies: strawberries, bananas, greek yogurt, peanut butter o Low fat greek yogurt with blueberries and granola  o Egg white omelet with spinach and mushrooms o Breakfast couscous: whole wheat couscous, apricots, skim milk, cranberries   . Sandwiches:  o Hummus and grilled vegetables (peppers, zucchini, squash) on whole wheat bread   o Grilled chicken on whole wheat pita with lettuce, tomatoes, cucumbers or tzatziki  o Tuna salad on whole wheat bread: tuna salad made with greek yogurt, olives, red peppers, capers, green onions o Garlic rosemary lamb pita: lamb sauted with garlic, rosemary, salt & pepper; add lettuce, cucumber, greek yogurt to pita - flavor with lemon juice and black pepper  . Seafood:  o Mediterranean grilled salmon, seasoned with garlic, basil, parsley, lemon juice and black pepper o Shrimp,  lemon, and spinach whole-grain pasta salad made with low fat greek yogurt  o Seared scallops with lemon orzo  o Seared tuna steaks seasoned salt, pepper, coriander topped with tomato mixture of olives, tomatoes, olive oil, minced garlic, parsley, green onions and cappers  . Meats:  o Herbed greek chicken salad with kalamata olives, cucumber, feta  o Red bell peppers stuffed with spinach, bulgur, lean ground beef (or lentils) & topped with feta   o Kebabs: skewers of chicken, tomatoes, onions, zucchini, squash  o [place] burgers: made with red onions, mint, dill, lemon juice, feta cheese topped with roasted red peppers . Vegetarian o Cucumber salad: cucumbers, artichoke hearts, celery, red onion, feta cheese, tossed in olive oil & lemon juice  o Hummus and whole grain pita points with a greek salad (lettuce, tomato, feta, olives, cucumbers, red onion) o Lentil soup with celery, carrots made with vegetable broth, garlic, salt and pepper  o Tabouli salad: parsley, bulgur, mint, scallions, cucumbers, tomato, radishes, lemon juice, olive oil, salt and pepper.     Mediterranean Diet A Mediterranean diet refers to food and lifestyle choices that are based on the traditions of countries located on the [employer]. This way of eating has been shown to help prevent certain conditions and improve outcomes for people who have  chronic diseases, like kidney disease and heart disease. What are tips for following this plan? Lifestyle  Cook and eat meals together with your family, when possible.  Drink enough fluid to keep your urine clear or pale yellow.  Be physically active every day. This includes: ? Aerobic exercise like running or swimming. ? Leisure activities like gardening, walking, or housework.  Get 7-8 hours of sleep each night.  If recommended by your health care provider, drink red wine in moderation. This means 1 glass a day for nonpregnant women and 2 glasses a day for men. A glass of wine equals 5 oz (150 mL). Reading food labels   Check the serving size of packaged foods. For foods such as rice and pasta, the serving size refers to the amount of cooked product, not dry.  Check the total fat in packaged foods. Avoid foods that have saturated fat or trans fats.  Check the ingredients list for added sugars, such as corn syrup. Shopping  At the grocery store, buy most of your food from the areas near the walls of the store. This includes: ? Fresh fruits and vegetables (produce). ? Grains, beans, nuts, and seeds. Some of these may be available in unpackaged forms or large amounts (in bulk). ? Fresh seafood. ? Poultry and eggs. ? Low-fat dairy products.  Buy whole ingredients instead of prepackaged foods.  Buy fresh fruits and vegetables in-season from local farmers markets.  Buy frozen fruits and vegetables in resealable bags.  If you do not have access to quality fresh seafood, buy precooked frozen shrimp or canned fish, such as tuna, salmon, or sardines.  Buy small amounts of raw or cooked vegetables, salads, or olives from the deli or salad bar at your store.  Stock your pantry so you always have certain foods on hand, such as olive oil, canned tuna, canned tomatoes, rice, pasta, and beans. Cooking  Cook foods with extra-virgin olive oil instead of using butter or other vegetable  oils.  Have meat as a side dish, and have vegetables or grains as your main dish. This means having meat in small portions or adding small amounts of meat to foods like pasta or  stew.  Use beans or vegetables instead of meat in common dishes like chili or lasagna.  Experiment with different cooking methods. Try roasting or broiling vegetables instead of steaming or sauteing them.  Add frozen vegetables to soups, stews, pasta, or rice.  Add nuts or seeds for added healthy fat at each meal. You can add these to yogurt, salads, or vegetable dishes.  Marinate fish or vegetables using olive oil, lemon juice, garlic, and fresh herbs. Meal planning   Plan to eat 1 vegetarian meal one day each week. Try to work up to 2 vegetarian meals, if possible.  Eat seafood 2 or more times a week.  Have healthy snacks readily available, such as: ? Vegetable sticks with hummus. ? [place] yogurt. ? Fruit and nut trail mix.  Eat balanced meals throughout the week. This includes: ? Fruit: 2-3 servings a day ? Vegetables: 4-5 servings a day ? Low-fat dairy: 2 servings a day ? Fish, poultry, or lean meat: 1 serving a day ? Beans and legumes: 2 or more servings a week ? Nuts and seeds: 1-2 servings a day ? Whole grains: 6-8 servings a day ? Extra-virgin olive oil: 3-4 servings a day  Limit red meat and sweets to only a few servings a month What are my food choices?  Mediterranean diet ? Recommended  Grains: Whole-grain pasta. Brown rice. Bulgar wheat. Polenta. Couscous. Whole-wheat bread. Orpah Cobb.  Vegetables: Artichokes. Beets. Broccoli. Cabbage. Carrots. Eggplant. Green beans. Chard. Kale. Spinach. Onions. Leeks. Peas. Squash. Tomatoes. Peppers. Radishes.  Fruits: Apples. Apricots. Avocado. Berries. Bananas. Cherries. Dates. Figs. Grapes. Lemons. Melon. Oranges. Peaches. Plums. Pomegranate.  Meats and other protein foods: Beans. Almonds. Sunflower seeds. Pine nuts. Peanuts. Cod. Salmon.  Scallops. Shrimp. Tuna. Tilapia. Clams. Oysters. Eggs.  Dairy: Low-fat milk. Cheese. Greek yogurt.  Beverages: Water. Red wine. Herbal tea.  Fats and oils: Extra virgin olive oil. Avocado oil. Grape seed oil.  Sweets and desserts: [place] yogurt with honey. Baked apples. Poached pears. Trail mix.  Seasoning and other foods: Basil. Cilantro. Coriander. Cumin. Mint. Parsley. Sage. Rosemary. Tarragon. Garlic. Oregano. Thyme. Pepper. Balsalmic vinegar. Tahini. Hummus. Tomato sauce. Olives. Mushrooms. ? Limit these  Grains: Prepackaged pasta or rice dishes. Prepackaged cereal with added sugar.  Vegetables: Deep fried potatoes (french fries).  Fruits: Fruit canned in syrup.  Meats and other protein foods: Beef. Pork. Lamb. Poultry with skin. Hot dogs. Tomasa Blase.  Dairy: Ice cream. Sour cream. Whole milk.  Beverages: Juice. Sugar-sweetened soft drinks. Beer. Liquor and spirits.  Fats and oils: Butter. Canola oil. Vegetable oil. Beef fat (tallow). Lard.  Sweets and desserts: Cookies. Cakes. Pies. Candy.  Seasoning and other foods: Mayonnaise. Premade sauces and marinades. The items listed may not be a complete list. Talk with your dietitian about what dietary choices are right for you. Summary  The Mediterranean diet includes both food and lifestyle choices.  Eat a variety of fresh fruits and vegetables, beans, nuts, seeds, and whole grains.  Limit the amount of red meat and sweets that you eat.  Talk with your health care provider about whether it is safe for you to drink red wine in moderation. This means 1 glass a day for nonpregnant women and 2 glasses a day for men. A glass of wine equals 5 oz (150 mL). This information is not intended to replace advice given to you by your health care provider. Make sure you discuss any questions you have with your health care provider. Document Released: 05/12/2016 Document Revised: 05/19/2016  Document Reviewed: 05/12/2016 Elsevier Patient  Education  2020 Else[employer] Inc. Liver Function Tests Why am I having this test? Liver function tests are done to see how well your liver is working. The proteins and enzymes measured in the test can alert your health care provider to inflammation, damage, or disease in your liver. It is common to have liver function tests:  When you are taking certain medicines.  If you have liver disease.  If you drink a lot of alcohol.  When you are not feeling well.  When you have other conditions that may affect your liver.  During annual physical exams.  If you have symptoms such as yellowing of the skin (jaundice), abdominal pain, or nausea and vomiting. What is being tested? These tests measure various substances in your blood. This may include:  Alanine transaminase (ALT). This is an enzyme in the liver.  Aspartate transaminase (AST). This is an enzyme in the liver, heart, and muscles.  Alkaline phosphatase (ALP). This is a protein in the liver, bile ducts, bone, and other body tissues.  Total bilirubin. This is a yellow pigment in bile.  Albumin. This is a protein in the liver.  Prothrombin time and international normalized ratio (PT and INR). PT measures the time it takes for your blood to clot. INR is a calculation of blood clotting time based on your PT result. It is also calculated based on normal ranges defined by the lab that processed your test.  Total protein. This includes two proteins, albumin and globulin, found in the blood. What kind of sample is taken?  A blood sample is required for this test. It is usually collected by inserting a needle into a blood vessel. How do I prepare for this test? How you prepare will depend on which tests are being done and the reason for doing them. You may need to:  Avoid eating for 4-6 hours before the test, or as told by your health care provider.  Stop taking certain medicines before your blood test, as told by your health care  provider. Tell a health care provider about:  All medicines you are taking, including vitamins, herbs, eye drops, creams, and over-the-counter medicines.  Any medical conditions you have.  Whether you are pregnant or may be pregnant. How are the results reported? Your test results will be reported as values. Your health care provider will compare your results to normal ranges that were established after testing a large group of people (reference ranges). Reference ranges may vary among labs and hospitals. For the substances measured in liver function tests, common reference ranges are: ALT  Infant: 10-40 international units/L.  Child or adult: 4-36 international units/L at 37C or 4-36 units/L (SI units).  Reference ranges may be higher for older adults. AST  Newborn 42-26 days old: 35-140 units/L.  Child younger than 77 years old: 15-60 units/L.  28-8 years old: 15-50 units/L.  7-91 years old: 10-50 units/L.  40-82 years old: 10-40 units/L.  Adult: 0-35 units/L or 0-0.58 microkatals/L (SI units).  Reference ranges may be higher for older adults. ALP  Child younger than 66 years old: 85-235 units/L.  28-92 years old: 65-210 units/L.  20-4 years old: 60-300 units/L.  5-41 years old: 30-200 units/L.  Adult: 30-120 units/L or 0.5-2.0 microkatals/L (SI units).  Reference ranges may be higher for older adults. Total bilirubin  Newborn: 1.0-12.0 mg/dL or 16.1-096 micromoles/L (SI units).  Child or adult: 0.3-1.0 mg/dL or 0.4-54 micromoles/L. Albumin  Premature infant: 3.0-4.2  g/dL.  Newborn: 5.7-3.2 g/dL.  Infant: 4.4-5.4 g/dL.  Child: 4.0-5.9 g/dL.  Adult: 3.5-5.0 g/dL or 20-25 g/L (SI units). PT  11.0-12.5 seconds; 85%-100%. INR  0.8-1.1. Total protein  Premature infant: 4.2-7.6 g/dL.  Newborn: 4.6-7.4 g/dL.  Infant: 6.0-6.7 g/dL.  Child: 6.2-8.0 g/dL.  Adult: 6.4-8.3 g/dL or 42-70 g/L (SI units). What do the results mean? Results that are within  the reference ranges are considered normal. For each substance measured, results outside the reference range can indicate various health issues. ALT  Levels above the normal range may indicate liver disease. AST  Levels above the normal range may indicate liver disease. Sometimes levels also increase after burns, surgery, heart attack, muscle damage, or seizure. ALP  Levels above the normal range may be seen in biliary obstruction, liver diseases, bone disease, thyroid disease, tumors, fractures, leukemia, lymphoma, or several other conditions. People with blood type O or B may show higher levels after a fatty meal.  Levels below the normal range may indicate bone and teeth conditions, malnutrition, protein deficiency, or Wilson's disease. Total bilirubin  Levels above the normal range may indicate problems with the liver, gallbladder, or bile ducts. Albumin  Levels above the normal range may indicate dehydration. They may also be caused by a diet that is high in protein.  Levels below the normal range may indicate kidney disease, liver disease, or malabsorption of nutrients. PT and INR  Levels above the normal range mean that your blood is clotting slower than normal. This may be due to blood disorders, liver disorders, or low levels of vitamin K. Total protein  Levels above the normal range may be due to infection or other diseases.  Levels below the normal range may be due to an immune system disorder, bleeding, burns, kidney disorder, liver disease, trouble absorbing or getting nutrients, or other conditions that affect the intestines. Talk with your health care provider about what your results mean. Questions to ask your health care provider Ask your health care provider, or the department that is doing the test:  When will my results be ready?  How will I get my results?  What are my treatment options?  What other tests do I need?  What are my next steps? Summary   Liver function tests are done to see how well your liver is working.  These tests measure various proteins and enzymes in your blood. The results can alert your health care provider to inflammation, damage, or disease in your liver.  Talk with your health care provider about what your results mean. This information is not intended to replace advice given to you by your health care provider. Make sure you discuss any questions you have with your health care provider. Document Released: 10/22/2004 Document Revised: 05/09/2018 Document Reviewed: 07/04/2017 Elsevier Patient Education  2020 [employer].

## 2019-07-31 NOTE — Progress Notes (Signed)
Subjective:    Patient ID: John [name], male    DOB: Apr 28, 1961, 58 y.o.   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  Had back surgery Feb 2020. I last saw patient 07/31/2018 for annual physical. Works for [employer] and has own business requiring CDL.  Retired Airline pilot.  One urology bladder stones and one orthopedic neurosurgery in past year and hospitalization for herniated disk L5-S1  Last DOT 2 years by Dr Cheryll Cockayne.  Reviewed paper COB chart and epic. PMHx gilberts syndrome; doesn't drink alcohol; stopped chewing tobacco June and has compensated with more eating.  Decreased activity this year due to surgeries.  PMP [place] review no Rx controlled since Feb 2020.  Patient reported not on NSAIDS/tylenol or narcotics since post surgery spring.  Some stiffness back especially with prolonged sitting/cold weather.  Pain currently 1/10.  Patient reported patellar reflexes right have been diminished since disk herniation and didn't improve after surgery.  Review of Systems  Constitutional: Positive for activity change. Negative for appetite change, chills, diaphoresis, fatigue and fever.  HENT: Negative for trouble swallowing and voice change.   Eyes: Negative for photophobia and visual disturbance.  Respiratory: Negative for cough, choking, chest tightness, shortness of breath, wheezing and stridor.   Cardiovascular: Negative for chest pain, palpitations and leg swelling.  Gastrointestinal: Negative for abdominal distention, abdominal pain, blood in stool, constipation, diarrhea, nausea and vomiting.  Endocrine: Negative for cold intolerance and heat intolerance.  Genitourinary: Negative for difficulty urinating.  Musculoskeletal: Positive for arthralgias and back pain. Negative for gait problem, joint swelling, myalgias, neck pain and neck  stiffness.  Allergic/Immunologic: Negative for environmental allergies and food allergies.  Neurological: Negative for dizziness, tremors, seizures, syncope, facial asymmetry, speech difficulty, weakness, light-headedness, numbness and headaches.  Hematological: Negative for adenopathy. Does not bruise/bleed easily.  Psychiatric/Behavioral: Negative for agitation, confusion and sleep disturbance.       Objective:   Physical Exam Vitals signs and nursing note reviewed. Exam conducted with a chaperone present.  Constitutional:      General: He is awake. He is not in acute distress.    Appearance: Normal appearance. He is well-developed, well-groomed and overweight. He is not ill-appearing, toxic-appearing or diaphoretic.  HENT:     Head: Normocephalic and atraumatic.     Jaw: There is normal jaw occlusion.     Salivary Glands: Right salivary gland is not diffusely enlarged or tender. Left salivary gland is not diffusely enlarged or tender.     Right Ear: Hearing, tympanic membrane, ear canal and external ear normal. There is no impacted cerumen.     Left Ear: Hearing, tympanic membrane, ear canal and external ear normal. There is no impacted cerumen.     Nose: Nose normal. No congestion or rhinorrhea.     Right Sinus: No maxillary sinus tenderness or frontal sinus tenderness.     Left Sinus: No maxillary sinus tenderness or frontal sinus tenderness.     Mouth/Throat:     Lips: Pink. No lesions.     Mouth: Mucous membranes are moist. No oral lesions or angioedema.     Dentition: No gingival swelling, dental caries, dental abscesses or gum lesions.     Tongue: No lesions. Tongue does not deviate from midline.     Palate: No mass and lesions.     Pharynx: Oropharynx is  clear. Uvula midline. No pharyngeal swelling, oropharyngeal exudate, posterior oropharyngeal erythema or uvula swelling.     Tonsils: No tonsillar exudate. 0 on the right. 0 on the left.  Eyes:     General: Lids are normal.  Vision grossly intact. Gaze aligned appropriately. No allergic shiner, visual field deficit or scleral icterus.       Right eye: No discharge.        Left eye: No discharge.     Extraocular Movements: Extraocular movements intact.     Conjunctiva/sclera: Conjunctivae normal.     Pupils: Pupils are equal, round, and reactive to light.  Neck:     Musculoskeletal: Normal range of motion and neck supple. Normal range of motion. No edema, erythema, neck rigidity, crepitus, injury, pain with movement, torticollis, spinous process tenderness or muscular tenderness.     Thyroid: No thyroid mass, thyromegaly or thyroid tenderness.     Vascular: No carotid bruit.     Trachea: Trachea and phonation normal.  Cardiovascular:     Rate and Rhythm: Normal rate and regular rhythm.     Pulses: Normal pulses.          Radial pulses are 2+ on the right side and 2+ on the left side.     Heart sounds: Normal heart sounds. No murmur. No friction rub. No gallop.   Pulmonary:     Effort: Pulmonary effort is normal. No respiratory distress.     Breath sounds: Normal breath sounds and air entry. No stridor, decreased air movement or transmitted upper airway sounds. No decreased breath sounds, wheezing, rhonchi or rales.     Comments: Spoke full sentences without difficulty; no cough observed in exam room; wearing cloth mask due to covid 19 pandemic Chest:     Chest wall: No tenderness.  Abdominal:     General: Abdomen is flat. Bowel sounds are normal. There is no distension or abdominal bruit. There are no signs of injury.     Palpations: Abdomen is soft. There is no shifting dullness, fluid wave, hepatomegaly, splenomegaly, mass or pulsatile mass.     Tenderness: There is no abdominal tenderness. There is no right CVA tenderness, left CVA tenderness, guarding or rebound. Negative signs include Murphy's sign.     Hernia: A hernia is present. Hernia is present in the ventral area and right inguinal area. There is no  hernia in the umbilical area or left inguinal area.       Comments: Dull to percussion x 4 quads; normoactive bowel sounds x 4 quads  Genitourinary:    Pubic Area: No rash or pubic lice.      Penis: Circumcised. No erythema, discharge, swelling or lesions.      Scrotum/Testes: Cremasteric reflex is present.     Tanner stage (genital): 5.     Comments: Chaperoned by RN Charm BargesAnn Dobbins for inguinal hernia exam; with cough right inguinal bulge that resolves spontaneously at rest nontender Musculoskeletal:        General: No swelling, tenderness, deformity or signs of injury.     Right shoulder: Normal.     Left shoulder: Normal.     Right elbow: Normal.    Left elbow: Normal.     Cervical back: Normal.     Thoracic back: Normal.     Lumbar back: He exhibits decreased range of motion and pain. He exhibits no tenderness, no bony tenderness, no swelling, no edema, no deformity, no laceration, no spasm and normal pulse.  Back:     Right lower leg: No edema.     Left lower leg: No edema.  Lymphadenopathy:     Head:     Right side of head: No submental, submandibular, tonsillar, preauricular, posterior auricular or occipital adenopathy.     Left side of head: No submental, submandibular, tonsillar, preauricular, posterior auricular or occipital adenopathy.     Cervical: No cervical adenopathy.     Right cervical: No superficial cervical adenopathy.    Left cervical: No superficial cervical adenopathy.     Lower Body: No right inguinal adenopathy. No left inguinal adenopathy.  Skin:    General: Skin is warm and dry.     Capillary Refill: Capillary refill takes less than 2 seconds.     Coloration: Skin is not ashen, cyanotic, jaundiced, mottled, pale or sallow.     Findings: No abrasion, abscess, acne, bruising, burn, ecchymosis, erythema, signs of injury, laceration, lesion, petechiae, rash or wound.     Nails: There is no clubbing.   Neurological:     General: No focal deficit present.      Mental Status: He is alert and oriented to person, place, and time. Mental status is at baseline.     GCS: GCS eye subscore is 4. GCS verbal subscore is 5. GCS motor subscore is 6.     Cranial Nerves: Cranial nerves are intact. No cranial nerve deficit, dysarthria or facial asymmetry.     Sensory: Sensation is intact. No sensory deficit.     Motor: Motor function is intact. No weakness, tremor, atrophy, abnormal muscle tone or seizure activity.     Coordination: Coordination is intact. Coordination normal.     Gait: Gait is intact. Gait normal.     Deep Tendon Reflexes: Reflexes abnormal.     Reflex Scores:      Patellar reflexes are 0 on the right side and 1+ on the left side.    Comments: On/off exam table and in /out of chair without difficulty; gait sure and steady hallway; bilateral hand grasp and upper/lower extremity strength 5/5  Psychiatric:        Attention and Perception: Attention and perception normal.        Mood and Affect: Mood and affect normal.        Speech: Speech normal.        Behavior: Behavior normal. Behavior is cooperative.        Thought Content: Thought content normal.        Cognition and Memory: Cognition and memory normal.        Judgment: Judgment normal.      Ventral hernia central with abdominal crunch Charm Barges RN Chaperoned right bulge with cough resolves at rest nontender inguinal Only able to touch knees with forward flexion Scars right shoulder, lumbar and RUQ abdomen healed nontender See [facility] 5875 Labs reviewed with patient exitcare handout and labs printed and given to patient on mediterranean diet, elevated lfts, hyperlipidemia     Assessment & Plan:  A-encounter for examination required by DOT,  BMI 29.95, renal calculi, elevated fasting glucose, elevated LFTs  P-1 year DOT medical certificate due to renal calculi surgery/CKD Stage III, elevated LFts, overweight, L5-S1 fusion Feb 2020, slightly elevated blood sugar prediabetic   Patient information entered into national DOT/[employer] medical certificate database and copy of certificate printed and given to patient; see paper copy [employer] 5875 and 5876 in outpatient paper record at [facility]. Patient to follow up in 1 year.  Sooner if new medical problems/hospitalization/ER visit.  Patient instructed to send in copy [employer] 5876 per [place] DOT directives.  Patient verbalized understanding information/instructions, agreed with plan of care and had no further questions at this time.  I recommended weight loss to BMI less than 25.  Increase low impact activities like stretching, walking, yard work.  Exitcare handout printed and given on preventing obesity problems, mediterranean diet.  Discussed holding breath during lifting/straining and weight gain could also promote hernia development/worsening.  Monitor for pain, increasing bulge or bulge that does not resolve at rest and follow up if these symptoms noted especially if accompanied by vomiting/blood red or black in stool.  Statins, excess weight, Gilbert's disease could be cause of elevated LFTs/T Bili (labs stable from previous years)  Keep added sugars less than 9 teaspoons/150 calories per day.  Dietary fiber 30 grams per day.  Avoid weight gain. I recommended exercise 150 minutes per week.  Patient verbalized understanding information/instructions, agreed with plan of care and had no further questions at this time.  Keep follow up with nephrology/urology.  Avoid dehydration.  Avoid NSAIDS use and OTC medications without first consulting provider/pharmacist.  I recommend drinking water to keep urine pale yellow clear and voiding every 4-6 hours while awake.  Patient verbalized understanding information/instructions, agreed with plan of care and had no further questions at this time.

## 2019-08-26 DIAGNOSIS — Z981 Arthrodesis status: Secondary | ICD-10-CM | POA: Diagnosis not present

## 2019-08-26 DIAGNOSIS — M5136 Other intervertebral disc degeneration, lumbar region: Secondary | ICD-10-CM | POA: Diagnosis not present

## 2019-10-09 ENCOUNTER — Other Ambulatory Visit: Payer: Self-pay

## 2019-10-09 DIAGNOSIS — E785 Hyperlipidemia, unspecified: Secondary | ICD-10-CM

## 2019-10-09 MED ORDER — ATORVASTATIN CALCIUM 10 MG PO TABS: 10.0000 mg | ORAL_TABLET | Freq: Every day | ORAL | 2 refills | Status: DC

## 2019-10-09 NOTE — Telephone Encounter (Signed)
Annual labs 1015/2020 total cholesterol 138 HDL low 33 trigs slightly elevated 160 LDL 77 glucose elevated 103 and ALT 51 T Bili 1.6 on lipitor since 2013.  Labs due 07/2020  Last office visit with me 07/31/2019  Electronic Rx sent to his pharmacy of choice atorvastatin 10mg  po daily #90 RF2.

## 2019-11-05 DIAGNOSIS — Z981 Arthrodesis status: Secondary | ICD-10-CM | POA: Diagnosis not present

## 2019-11-05 DIAGNOSIS — M5136 Other intervertebral disc degeneration, lumbar region: Secondary | ICD-10-CM | POA: Diagnosis not present

## 2019-11-05 DIAGNOSIS — M47816 Spondylosis without myelopathy or radiculopathy, lumbar region: Secondary | ICD-10-CM | POA: Diagnosis not present

## 2019-11-27 DIAGNOSIS — M7652 Patellar tendinitis, left knee: Secondary | ICD-10-CM | POA: Diagnosis not present

## 2019-11-27 DIAGNOSIS — M1712 Unilateral primary osteoarthritis, left knee: Secondary | ICD-10-CM | POA: Diagnosis not present

## 2019-11-27 DIAGNOSIS — M25562 Pain in left knee: Secondary | ICD-10-CM | POA: Diagnosis not present

## 2019-11-27 DIAGNOSIS — M76892 Other specified enthesopathies of left lower limb, excluding foot: Secondary | ICD-10-CM | POA: Diagnosis not present

## 2020-02-20 DIAGNOSIS — N2581 Secondary hyperparathyroidism of renal origin: Secondary | ICD-10-CM | POA: Diagnosis not present

## 2020-02-20 DIAGNOSIS — M109 Gout, unspecified: Secondary | ICD-10-CM | POA: Diagnosis not present

## 2020-02-20 DIAGNOSIS — I10 Essential (primary) hypertension: Secondary | ICD-10-CM | POA: Diagnosis not present

## 2020-02-20 DIAGNOSIS — N182 Chronic kidney disease, stage 2 (mild): Secondary | ICD-10-CM | POA: Diagnosis not present

## 2020-02-25 DIAGNOSIS — L821 Other seborrheic keratosis: Secondary | ICD-10-CM | POA: Diagnosis not present

## 2020-03-20 ENCOUNTER — Other Ambulatory Visit: Payer: Self-pay | Admitting: *Deleted

## 2020-03-20 DIAGNOSIS — N200 Calculus of kidney: Secondary | ICD-10-CM

## 2020-03-20 MED ORDER — TAMSULOSIN HCL 0.4 MG PO CAPS: 0.4000 mg | ORAL_CAPSULE | Freq: Every day | ORAL | 2 refills | Status: DC

## 2020-05-26 DIAGNOSIS — H04123 Dry eye syndrome of bilateral lacrimal glands: Secondary | ICD-10-CM | POA: Diagnosis not present

## 2020-07-05 IMAGING — CR DG ABDOMEN 1V
1 series · 2 of 2 positions shown · non-contrast
Comparison: 04/26/18

CLINICAL DATA: Follow-up kidney stones

EXAM:
ABDOMEN - 1 VIEW

[Series 1: dg abd 1 view · 0.14mm/px · 2 of 2 slices shown]
[im 1/2]
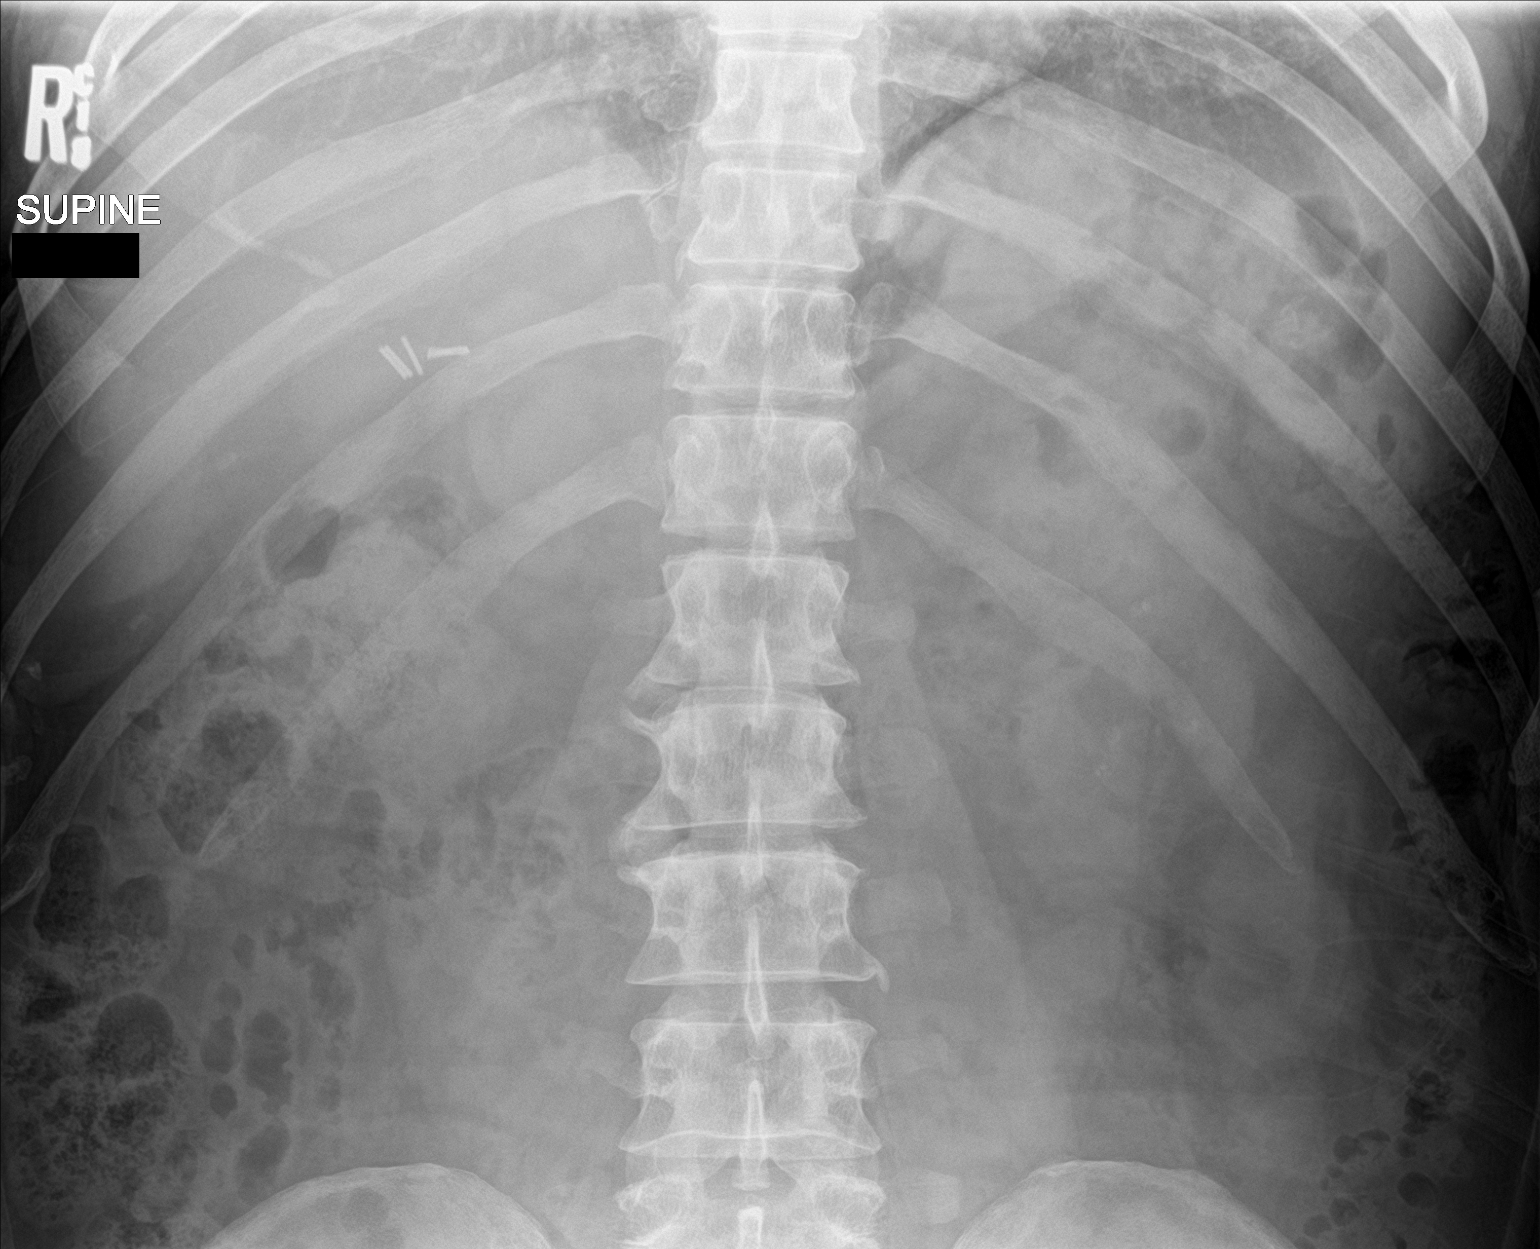
[im 2/2]
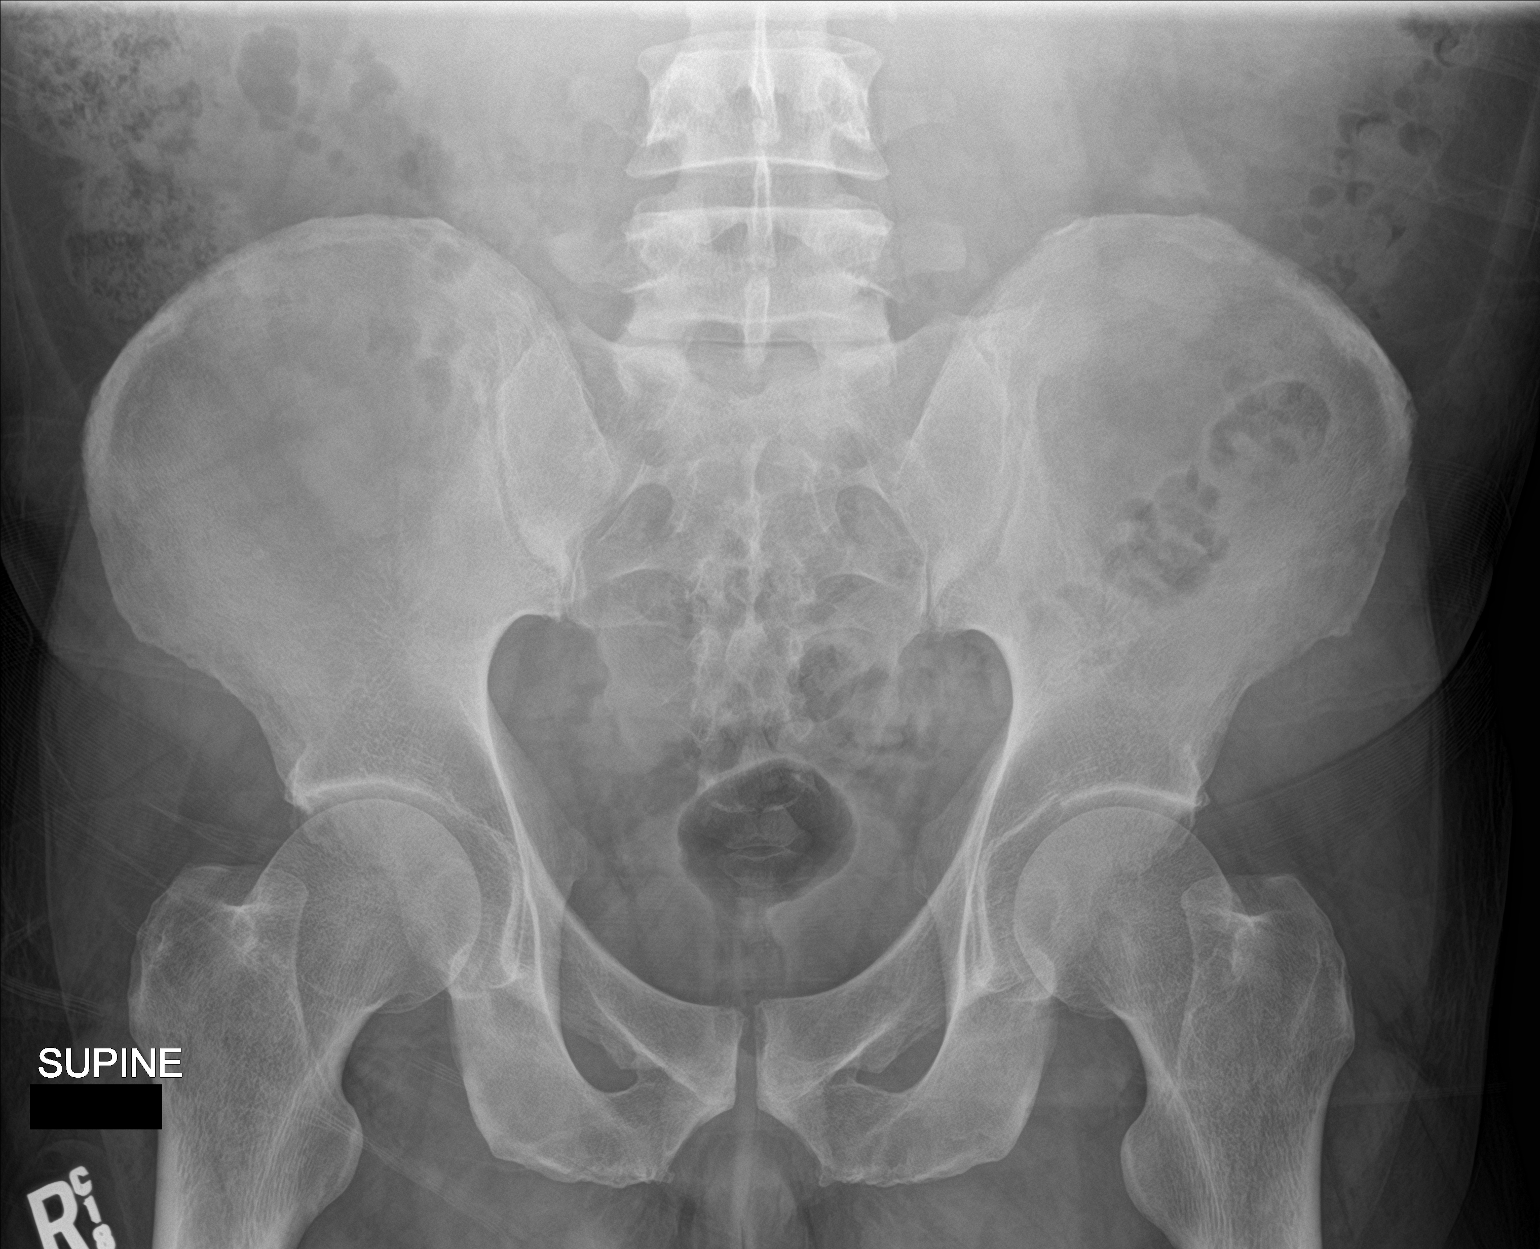

[2 of 2 positions shown; findings below may reference images not displayed]

FINDINGS: Scattered large and small bowel gas is noted. No obstructive changes
are seen. Scattered small calcifications are noted over the left mid
and lower kidney. No definitive right renal stones are seen. No
ureteral calculi are seen. No bony abnormality is noted.
IMPRESSION: Stable appearing left renal stones.

## 2020-07-15 ENCOUNTER — Other Ambulatory Visit: Payer: Self-pay | Admitting: Family Medicine

## 2020-07-15 ENCOUNTER — Encounter: Payer: Self-pay | Admitting: Urology

## 2020-07-15 ENCOUNTER — Ambulatory Visit
Admission: RE | Admit: 2020-07-15 | Discharge: 2020-07-15 | Disposition: A | Discharge status: Home / Self Care | Payer: 59 | Source: Ambulatory Visit | Attending: Urology | Admitting: Urology

## 2020-07-15 ENCOUNTER — Other Ambulatory Visit: Payer: Self-pay

## 2020-07-15 ENCOUNTER — Ambulatory Visit (INDEPENDENT_AMBULATORY_CARE_PROVIDER_SITE_OTHER): Payer: 59 | Admitting: Urology

## 2020-07-15 ENCOUNTER — Ambulatory Visit
Admission: RE | Admit: 2020-07-15 | Discharge: 2020-07-15 | Disposition: A | Discharge status: Home / Self Care | Payer: 59 | Attending: Urology | Admitting: Urology

## 2020-07-15 VITALS — BP 137/84 | HR 80

## 2020-07-15 DIAGNOSIS — N200 Calculus of kidney: Secondary | ICD-10-CM | POA: Diagnosis not present

## 2020-07-15 DIAGNOSIS — Z9049 Acquired absence of other specified parts of digestive tract: Secondary | ICD-10-CM | POA: Diagnosis not present

## 2020-07-15 DIAGNOSIS — Z981 Arthrodesis status: Secondary | ICD-10-CM | POA: Diagnosis not present

## 2020-07-15 NOTE — Progress Notes (Signed)
07/15/2020 10:37 AM   Faylene Million Marian [birth year] [id number]  Referring provider: Jaclyn Shaggy, MD [address]   [place],  [place] [address]  Chief Complaint  Patient presents with  . Nephrolithiasis    Urologic history: 1.  Recurrent stone disease -Bilateral nonobstructing renal calculi -Cystolitholapaxy 12/2018 -Ureteroscopic stone removal 10/2017   HPI: 59 y.o. male presents for annual follow-up.   He has done well since last years visit denies flank, abdominal or pelvic pain  No episodes of renal colic or stone passage  No bothersome LUTS   PMH: Past Medical History:  Diagnosis Date  . History of kidney stones   . Hyperlipidemia   . Hypertension     Surgical History: Past Surgical History:  Procedure Laterality Date  . BACK SURGERY    . CHOLECYSTECTOMY    . CYSTOSCOPY WITH LITHOLAPAXY N/A 12/18/2018   Procedure: CYSTOSCOPY WITH LITHOLAPAXY;  Surgeon: Riki Altes, MD;  Location: [facility] ORS;  Service: Urology;  Laterality: N/A;  . CYSTOSCOPY/URETEROSCOPY/HOLMIUM LASER/STENT PLACEMENT Left 10/12/2017   Procedure: CYSTOSCOPY/URETEROSCOPY/HOLMIUM LASER/STENT PLACEMENT;  Surgeon: Riki Altes, MD;  Location: [facility] OR[facility];  Service: Urology;  Laterality: Left;  . LUMBAR FUSION     L5S1  . SHOULDER ARTHROSCOPY WITH SUBACROMIAL DECOMPRESSION Right 10/27/2016   Procedure: SHOULDER ARTHROSCOPY WITH SUBACROMIAL DECOMPRESSION  AND DEBRIDEMENT;  Surgeon: Christena Flake, MD;  Location: [facility] ORS;  Service: Orthopedics;  Laterality: Right;  . SHOULDER SURGERY Right 2003    Home Medications:  Allergies as of 07/15/2020   No Known Allergies     Medication List       Accurate as of July 15, 2020 11:59 PM. If you have any questions, ask your nurse or doctor.        allopurinol 100 MG tablet Commonly known as: ZYLOPRIM Take 200 mg by mouth at bedtime.   atorvastatin 10 MG tablet Commonly known as: LIPITOR Take 1 tablet (10 mg total) by mouth at bedtime.     losartan 25 MG tablet Commonly known as: COZAAR Take 1 tablet by mouth at bedtime.   tamsulosin 0.4 MG Caps capsule Commonly known as: FLOMAX Take 1 capsule (0.4 mg total) by mouth at bedtime.       Allergies: No Known Allergies  Family History: Family History  Problem Relation Age of Onset  . Heart disease Father   . Prostate cancer Neg Hx   . Bladder Cancer Neg Hx   . Kidney cancer Neg Hx     Social History:  reports that he has never smoked. He has quit using smokeless tobacco.  His smokeless tobacco use included snuff. He reports that he does not drink alcohol and does not use drugs.   Physical Exam: BP 137/84 (BP Location: Left Arm, Patient Position: Sitting, Cuff Size: Normal)   Pulse 80   Constitutional:  Alert and oriented, No acute distress. HEENT: [place] AT, moist mucus membranes.  Trachea midline, no masses. Cardiovascular: No clubbing, cyanosis, or edema. Respiratory: Normal respiratory effort, no increased work of breathing. GI: Abdomen is soft, nontender, nondistended, no abdominal masses Neurologic: Grossly intact, no focal deficits, moving all 4 extremities. Psychiatric: Normal mood and affect.   Pertinent Imaging: KUB performed today reviewed and stable bilateral nephrolithiasis   Assessment & Plan:    1.  Bilateral nephrolithiasis  Stable  Follow-up 1 year with KUB   Riki Altes, MD  [facility] [address] [place], [place] [address] [phone]

## 2020-07-20 ENCOUNTER — Ambulatory Visit: Payer: 59 | Admitting: Urology

## 2020-07-20 ENCOUNTER — Other Ambulatory Visit: Payer: Self-pay | Admitting: Registered Nurse

## 2020-07-20 DIAGNOSIS — E785 Hyperlipidemia, unspecified: Secondary | ICD-10-CM

## 2020-07-20 NOTE — Telephone Encounter (Signed)
COB pt

## 2020-08-26 DIAGNOSIS — N1831 Chronic kidney disease, stage 3a: Secondary | ICD-10-CM | POA: Diagnosis not present

## 2020-08-26 DIAGNOSIS — I10 Essential (primary) hypertension: Secondary | ICD-10-CM | POA: Diagnosis not present

## 2020-08-26 DIAGNOSIS — N2581 Secondary hyperparathyroidism of renal origin: Secondary | ICD-10-CM | POA: Diagnosis not present

## 2020-10-14 ENCOUNTER — Other Ambulatory Visit: Payer: Self-pay

## 2020-10-14 DIAGNOSIS — E785 Hyperlipidemia, unspecified: Secondary | ICD-10-CM

## 2020-10-15 MED ORDER — ATORVASTATIN CALCIUM 10 MG PO TABS: 10.0000 mg | ORAL_TABLET | Freq: Every day | ORAL | 0 refills | Status: DC

## 2020-11-27 NOTE — Progress Notes (Signed)
Pt scheduled to complete physical 12/07/20 with Viviano Simas FNP. CL,RMA

## 2020-11-30 ENCOUNTER — Ambulatory Visit: Payer: Self-pay

## 2020-11-30 ENCOUNTER — Other Ambulatory Visit: Payer: Self-pay

## 2020-11-30 DIAGNOSIS — Z01818 Encounter for other preprocedural examination: Secondary | ICD-10-CM

## 2020-11-30 LAB — POCT URINALYSIS DIPSTICK
Bilirubin, UA: NEGATIVE
Glucose, UA: NEGATIVE
Ketones, UA: NEGATIVE
Leukocytes, UA: NEGATIVE
Nitrite, UA: NEGATIVE
Protein, UA: NEGATIVE
Spec Grav, UA: >=1.03 — AB (ref 1.010–1.025)
Urobilinogen, UA: 0.2 E.U./dL
pH, UA: 5.5 (ref 5.0–8.0)

## 2020-12-01 LAB — CMP12+LP+TP+TSH+6AC+PSA+CBC…
ALT: 39 IU/L (ref 0–44)
AST: 24 IU/L (ref 0–40)
Albumin/Globulin Ratio: 2 (ref 1.2–2.2)
Albumin: 4.7 g/dL (ref 3.8–4.9)
Alkaline Phosphatase: 91 IU/L (ref 44–121)
BUN/Creatinine Ratio: 12 (ref 10–24)
BUN: 16 mg/dL (ref 8–27)
Basophils Absolute: 0.1 10*3/uL (ref 0.0–0.2)
Basos: 1 %
Bilirubin Total: 1.5 mg/dL — ABNORMAL HIGH (ref 0.0–1.2)
Calcium: 9.9 mg/dL (ref 8.6–10.2)
Chloride: 103 mmol/L (ref 96–106)
Chol/HDL Ratio: 3.8 ratio (ref 0.0–5.0)
Cholesterol, Total: 145 mg/dL (ref 100–199)
Creatinine, Ser: 1.36 mg/dL — ABNORMAL HIGH (ref 0.76–1.27)
EOS (ABSOLUTE): 0.2 10*3/uL (ref 0.0–0.4)
Eos: 2 %
Estimated CHD Risk: 0.7 times avg. (ref 0.0–1.0)
Free Thyroxine Index: 2.1 (ref 1.2–4.9)
GGT: 36 IU/L (ref 0–65)
Globulin, Total: 2.4 g/dL (ref 1.5–4.5)
Glucose: 96 mg/dL (ref 65–99)
HDL: 38 mg/dL — ABNORMAL LOW (ref >39)
Hematocrit: 55.1 % — ABNORMAL HIGH (ref 37.5–51.0)
Hemoglobin: 18.3 g/dL — ABNORMAL HIGH (ref 13.0–17.7)
Immature Grans (Abs): 0.1 10*3/uL (ref 0.0–0.1)
Immature Granulocytes: 1 %
Iron: 72 ug/dL (ref 38–169)
LDH: 163 IU/L (ref 121–224)
LDL Chol Calc (NIH): 79 mg/dL (ref 0–99)
Lymphocytes Absolute: 2.5 10*3/uL (ref 0.7–3.1)
Lymphs: 26 %
MCH: 29.5 pg (ref 26.6–33.0)
MCHC: 33.2 g/dL (ref 31.5–35.7)
MCV: 89 fL (ref 79–97)
Monocytes Absolute: 0.7 10*3/uL (ref 0.1–0.9)
Monocytes: 7 %
Neutrophils Absolute: 6.1 10*3/uL (ref 1.4–7.0)
Neutrophils: 63 %
Phosphorus: 4.2 mg/dL — ABNORMAL HIGH (ref 2.8–4.1)
Platelets: 314 10*3/uL (ref 150–450)
Potassium: 4.7 mmol/L (ref 3.5–5.2)
Prostate Specific Ag, Serum: 2.3 ng/mL (ref 0.0–4.0)
RBC: 6.2 x10E6/uL — ABNORMAL HIGH (ref 4.14–5.80)
RDW: 14.4 % (ref 11.6–15.4)
Sodium: 142 mmol/L (ref 134–144)
T3 Uptake Ratio: 29 % (ref 24–39)
T4, Total: 7.1 ug/dL (ref 4.5–12.0)
TSH: 2.83 u[IU]/mL (ref 0.450–4.500)
Total Protein: 7.1 g/dL (ref 6.0–8.5)
Triglycerides: 159 mg/dL — ABNORMAL HIGH (ref 0–149)
Uric Acid: 4.9 mg/dL (ref 3.8–8.4)
VLDL Cholesterol Cal: 28 mg/dL (ref 5–40)
WBC: 9.7 10*3/uL (ref 3.4–10.8)
eGFR: 60 mL/min/{1.73_m2} (ref >59)

## 2020-12-07 ENCOUNTER — Ambulatory Visit: Payer: Self-pay | Admitting: Nurse Practitioner

## 2020-12-07 ENCOUNTER — Other Ambulatory Visit: Payer: Self-pay

## 2020-12-07 ENCOUNTER — Encounter: Payer: Self-pay | Admitting: Nurse Practitioner

## 2020-12-07 VITALS — BP 138/85 | HR 70 | Temp 97.6°F | Resp 12 | Ht 72.0 in | Wt 225.0 lb

## 2020-12-07 DIAGNOSIS — E785 Hyperlipidemia, unspecified: Secondary | ICD-10-CM

## 2020-12-07 DIAGNOSIS — Z1211 Encounter for screening for malignant neoplasm of colon: Secondary | ICD-10-CM

## 2020-12-07 MED ORDER — ATORVASTATIN CALCIUM 10 MG PO TABS: 10.0000 mg | ORAL_TABLET | Freq: Every day | ORAL | 0 refills | Status: DC

## 2020-12-07 NOTE — Progress Notes (Signed)
Subjective:    Patient ID: John [name], male    DOB: 1961-04-11, 60 y.o.   MRN: [id number]  HPI  60 year old male retired [employer] here for annual physical. History of HTN, Hyperlipidemia, Nephrolithiasis, three lumbar surgeries most recent with fusion L5-S1  2020.  Also has had Arthroscopic labral debridement and arthroscopic subacromial decompression, right shoulder in 2018.   Today he notes that he fell a week or so ago onto his left side, initially had pain in ribs and pain with coughing but not with breathing. Pain has improved. He did not take anything for pain at that time.  On physical exam 07/31/2019 a ventral and right inguinal hernia was assessed during DOT exam patient without complaints today.   Has been to ophthalmology in past year with adjustment to bifocals.   EKG performed 11/30/2020 NSR.   Colonoscopy 09/2011: two polyps removed from cecum and sigmoid colon. [facility].   Most recently seen by Urology 07/15/2020 notes are as follows: Urologic history: 1.  Recurrent stone disease -Bilateral nonobstructing renal calculi -Cystolitholapaxy 12/2018 -Ureteroscopic stone removal 10/2017 Pertinent Imaging: KUB performed (day of appointment)  reviewed and stable bilateral nephrolithiasis   Current Outpatient Medications  Medication Instructions  . allopurinol (ZYLOPRIM) 200 mg, Oral, Daily at bedtime  . atorvastatin (LIPITOR) 10 mg, Oral, Daily  . cyclobenzaprine (FLEXERIL) 10 mg, Every 8 hours PRN  . losartan (COZAAR) 25 MG tablet 1 tablet, Oral, Daily at bedtime  . tamsulosin (FLOMAX) 0.4 mg, Oral, Daily at bedtime  Uses Flexeril only as needed. Has not used since surgery in 2020. Flomax & Cozaar Rx from Urology. Statin Rx per [place].    Today's Vitals   12/07/20 0809  BP: 138/85  Pulse: 70  Resp: 12  Temp: 97.6 F (36.4 C)  TempSrc: Oral  SpO2: 97%  Weight: 225 lb (102.1 kg)  Height: 6' (1.829 m)   Body mass index  is 30.52 kg/m.  Review of Systems  Constitutional: Negative.   HENT: Negative.   Eyes: Negative.   Respiratory: Negative.   Cardiovascular: Negative.   Gastrointestinal: Negative.   Endocrine: Negative.   Genitourinary: Negative.   Musculoskeletal:       Tenderness to left lateral ribs  Allergic/Immunologic: Negative.   Neurological: Negative.   Hematological: Negative.   Psychiatric/Behavioral: Negative.    Past Medical History:  Diagnosis Date  . History of kidney stones   . Hyperlipidemia   . Hypertension       Objective:   Physical Exam Constitutional:      Appearance: Normal appearance.  HENT:     Head: Normocephalic.     Right Ear: Tympanic membrane, ear canal and external ear normal.     Left Ear: Tympanic membrane, ear canal and external ear normal.     Nose: Nose normal.     Mouth/Throat:     Mouth: Mucous membranes are moist.     Pharynx: Oropharynx is clear.  Eyes:     Extraocular Movements: Extraocular movements intact.     Conjunctiva/sclera: Conjunctivae normal.     Pupils: Pupils are equal, round, and reactive to light.  Cardiovascular:     Rate and Rhythm: Normal rate and regular rhythm.     Heart sounds: Normal heart sounds.  Pulmonary:     Effort: Pulmonary effort is normal.     Breath sounds: Normal breath sounds.       Comments: Clear lung sounds throughout, able to take  deep breaths without distress. Tenderness to palpation of lateral aspect of 7th and 8th rib. No bruising to skin.   Abdominal:     General: Bowel sounds are normal.     Palpations: Abdomen is soft.     Tenderness: There is no abdominal tenderness. There is no guarding.  Musculoskeletal:        General: Normal range of motion.     Cervical back: Normal range of motion.  Skin:    General: Skin is warm and dry.     Capillary Refill: Capillary refill takes less than 2 seconds.  Neurological:     General: No focal deficit present.     Mental Status: He is alert and  oriented to person, place, and time.  Psychiatric:        Mood and Affect: Mood normal.        Behavior: Behavior normal.        Thought Content: Thought content normal.        Judgment: Judgment normal.    Recent Results (from the past 2160 hour(s))  CMP12+LP+TP+TSH+6AC+PSA+CBC.     Status: Abnormal   Collection Time: 11/30/20  8:32 AM  Result Value Ref Range   Glucose 96 65 - 99 mg/dL   Uric Acid 4.9 3.8 - 8.4 mg/dL    Comment:            Therapeutic target for gout patients: <6.0   BUN 16 8 - 27 mg/dL   Creatinine, Ser 1.36 (H) 0.76 - 1.27 mg/dL   eGFR 60 >59 mL/min/1.73    Comment: **In accordance with recommendations from the NKF-ASN Task force,**   [facility] has updated its eGFR calculation to the 2021 CKD-EPI   creatinine equation that estimates kidney function without a race   variable.    BUN/Creatinine Ratio 12 10 - 24   Sodium 142 134 - 144 mmol/L   Potassium 4.7 3.5 - 5.2 mmol/L   Chloride 103 96 - 106 mmol/L   Calcium 9.9 8.6 - 10.2 mg/dL   Phosphorus 4.2 (H) 2.8 - 4.1 mg/dL   Total Protein 7.1 6.0 - 8.5 g/dL   Albumin 4.7 3.8 - 4.9 g/dL   Globulin, Total 2.4 1.5 - 4.5 g/dL   Albumin/Globulin Ratio 2.0 1.2 - 2.2   Bilirubin Total 1.5 (H) 0.0 - 1.2 mg/dL   Alkaline Phosphatase 91 44 - 121 IU/L   LDH 163 121 - 224 IU/L   AST 24 0 - 40 IU/L   ALT 39 0 - 44 IU/L   GGT 36 0 - 65 IU/L   Iron 72 38 - 169 ug/dL   Cholesterol, Total 145 100 - 199 mg/dL   Triglycerides 159 (H) 0 - 149 mg/dL   HDL 38 (L) >39 mg/dL   VLDL Cholesterol Cal 28 5 - 40 mg/dL   LDL Chol Calc (NIH) 79 0 - 99 mg/dL   Chol/HDL Ratio 3.8 0.0 - 5.0 ratio    Comment:                                   T. Chol/HDL Ratio                                             Men  Women  1/2 Avg.Risk  3.4    3.3                                   Avg.Risk  5.0    4.4                                2X Avg.Risk  9.6    7.1                                3X Avg.Risk 23.4   11.0     Estimated CHD Risk 0.7 0.0 - 1.0 times avg.    Comment: The CHD Risk is based on the T. Chol/HDL ratio. Other factors affect CHD Risk such as hypertension, smoking, diabetes, severe obesity, and family history of premature CHD.    TSH 2.830 0.450 - 4.500 uIU/mL   T4, Total 7.1 4.5 - 12.0 ug/dL   T3 Uptake Ratio 29 24 - 39 %   Free Thyroxine Index 2.1 1.2 - 4.9   Prostate Specific Ag, Serum 2.3 0.0 - 4.0 ng/mL    Comment: Roche ECLIA methodology. According to the American Urological Association, Serum PSA should decrease and remain at undetectable levels after radical prostatectomy. The AUA defines biochemical recurrence as an initial PSA value 0.2 ng/mL or greater followed by a subsequent confirmatory PSA value 0.2 ng/mL or greater. Values obtained with different assay methods or kits cannot be used interchangeably. Results cannot be interpreted as absolute evidence of the presence or absence of malignant disease.    WBC 9.7 3.4 - 10.8 x10E3/uL   RBC 6.20 (H) 4.14 - 5.80 x10E6/uL   Hemoglobin 18.3 (H) 13.0 - 17.7 g/dL   Hematocrit 55.1 (H) 37.5 - 51.0 %   MCV 89 79 - 97 fL   MCH 29.5 26.6 - 33.0 pg   MCHC 33.2 31.5 - 35.7 g/dL   RDW 14.4 11.6 - 15.4 %   Platelets 314 150 - 450 x10E3/uL   Neutrophils 63 Not Estab. %   Lymphs 26 Not Estab. %   Monocytes 7 Not Estab. %   Eos 2 Not Estab. %   Basos 1 Not Estab. %   Neutrophils Absolute 6.1 1.4 - 7.0 x10E3/uL   Lymphocytes Absolute 2.5 0.7 - 3.1 x10E3/uL   Monocytes Absolute 0.7 0.1 - 0.9 x10E3/uL   EOS (ABSOLUTE) 0.2 0.0 - 0.4 x10E3/uL   Basophils Absolute 0.1 0.0 - 0.2 x10E3/uL   Immature Granulocytes 1 Not Estab. %   Immature Grans (Abs) 0.1 0.0 - 0.1 x10E3/uL  POCT urinalysis dipstick     Status: Abnormal   Collection Time: 11/30/20  8:51 AM  Result Value Ref Range   Color, UA yellow    Clarity, UA cloudy    Glucose, UA Negative Negative   Bilirubin, UA negative    Ketones, UA negative    Spec Grav, UA >=1.030 (A)  1.010 - 1.025   Blood, UA +-     Comment: 1 Ery/uL   pH, UA 5.5 5.0 - 8.0   Protein, UA Negative Negative   Urobilinogen, UA 0.2 0.2 or 1.0 E.U./dL   Nitrite, UA negative    Leukocytes, UA Negative Negative   Appearance     Odor       Discussed abnormal findings: 1. Elevated creatinine-  discussed kidney history and emphasized need for proper hydration 2. Elevated bilirubin- this is stable and due to underlying Gilbert's syndrome 3. Elevated triglycerides and low HDL- recommended increasing Omega3 and start Fishoil. Continue Statin.  4. Evaluated Specific gravity-supports dehydration status     Assessment & Plan:  1. Notes and Plan from Urology:07/15/2020 Bilateral nephrolithiasis  Stable Follow-up 1 year with KUB (07/2021)  2. Increased triglycerides and low HDL- patient will make more of an effort to eat foods high in Omega3, open to starting Fish oil supplement OTC.   3. Dehydration, emphasized the need for increased water intake especially going into summer months.   4.  Due for colonoscopy by end of year 2022- most recent was 09/2011 [facility]. Will have referral set up.   Will refill statin, patient instructed to return to clinic as needed prior to next physical.   Meds ordered this encounter  Medications  . atorvastatin (LIPITOR) 10 MG tablet    Sig: Take 1 tablet (10 mg total) by mouth daily.    Dispense:  90 tablet    Refill:  0

## 2020-12-07 NOTE — Patient Instructions (Signed)

## 2020-12-07 NOTE — Progress Notes (Signed)
Labs & EKG completed 11/30/20.  AMD

## 2020-12-16 ENCOUNTER — Other Ambulatory Visit: Payer: Self-pay | Admitting: Urology

## 2020-12-16 DIAGNOSIS — N200 Calculus of kidney: Secondary | ICD-10-CM

## 2020-12-31 DIAGNOSIS — N1831 Chronic kidney disease, stage 3a: Secondary | ICD-10-CM | POA: Diagnosis not present

## 2020-12-31 DIAGNOSIS — N200 Calculus of kidney: Secondary | ICD-10-CM | POA: Diagnosis not present

## 2020-12-31 DIAGNOSIS — I10 Essential (primary) hypertension: Secondary | ICD-10-CM | POA: Diagnosis not present

## 2020-12-31 DIAGNOSIS — M109 Gout, unspecified: Secondary | ICD-10-CM | POA: Diagnosis not present

## 2021-04-20 ENCOUNTER — Other Ambulatory Visit: Payer: Self-pay

## 2021-04-20 DIAGNOSIS — E785 Hyperlipidemia, unspecified: Secondary | ICD-10-CM

## 2021-04-20 MED ORDER — ATORVASTATIN CALCIUM 10 MG PO TABS: 10.0000 mg | ORAL_TABLET | Freq: Every day | ORAL | 0 refills | Status: DC

## 2021-05-06 DIAGNOSIS — I10 Essential (primary) hypertension: Secondary | ICD-10-CM | POA: Diagnosis not present

## 2021-05-06 DIAGNOSIS — N182 Chronic kidney disease, stage 2 (mild): Secondary | ICD-10-CM | POA: Diagnosis not present

## 2021-05-06 DIAGNOSIS — N200 Calculus of kidney: Secondary | ICD-10-CM | POA: Diagnosis not present

## 2021-05-06 DIAGNOSIS — M109 Gout, unspecified: Secondary | ICD-10-CM | POA: Diagnosis not present

## 2021-07-15 ENCOUNTER — Encounter: Payer: Self-pay | Admitting: Urology

## 2021-07-15 ENCOUNTER — Ambulatory Visit
Admission: RE | Admit: 2021-07-15 | Discharge: 2021-07-15 | Disposition: A | Discharge status: Home / Self Care | Payer: 59 | Source: Ambulatory Visit | Attending: Urology | Admitting: Urology

## 2021-07-15 ENCOUNTER — Ambulatory Visit (INDEPENDENT_AMBULATORY_CARE_PROVIDER_SITE_OTHER): Payer: 59 | Admitting: Urology

## 2021-07-15 ENCOUNTER — Other Ambulatory Visit: Payer: Self-pay

## 2021-07-15 VITALS — BP 151/93 | HR 53 | Ht 72.0 in | Wt 220.0 lb

## 2021-07-15 DIAGNOSIS — N200 Calculus of kidney: Secondary | ICD-10-CM

## 2021-07-15 DIAGNOSIS — Z981 Arthrodesis status: Secondary | ICD-10-CM | POA: Diagnosis not present

## 2021-07-15 DIAGNOSIS — Z9049 Acquired absence of other specified parts of digestive tract: Secondary | ICD-10-CM | POA: Diagnosis not present

## 2021-07-15 NOTE — Progress Notes (Signed)
07/15/2021 10:24 AM   John [name] 01/29/1961 [id number]  Referring provider: Jaclyn Shaggy, MD [address]   [place],  [place] [address]  No chief complaint on file.   Urologic history: 1.  Recurrent stone disease -Bilateral nonobstructing renal calculi -Cystolitholapaxy 12/2018 -Ureteroscopic stone removal 10/2017  HPI: 60 y.o. male presents for annual follow-up.  Doing well since last visit No bothersome LUTS Denies dysuria, gross hematuria Denies flank, abdominal or pelvic pain KUB performed today reviewed and stable, bilateral renal calculi   PMH: Past Medical History:  Diagnosis Date   History of kidney stones    Hyperlipidemia    Hypertension     Surgical History: Past Surgical History:  Procedure Laterality Date   BACK SURGERY     CHOLECYSTECTOMY     CYSTOSCOPY WITH LITHOLAPAXY N/A 12/18/2018   Procedure: CYSTOSCOPY WITH LITHOLAPAXY;  Surgeon: John Altes, MD;  Location: [facility];  Service: Urology;  Laterality: N/A;   CYSTOSCOPY/URETEROSCOPY/HOLMIUM LASER/STENT PLACEMENT Left 10/12/2017   Procedure: CYSTOSCOPY/URETEROSCOPY/HOLMIUM LASER/STENT PLACEMENT;  Surgeon: John Altes, MD;  Location: [facility];  Service: Urology;  Laterality: Left;   LUMBAR FUSION     L5S1   SHOULDER ARTHROSCOPY WITH SUBACROMIAL DECOMPRESSION Right 10/27/2016   Procedure: SHOULDER ARTHROSCOPY WITH SUBACROMIAL DECOMPRESSION  AND DEBRIDEMENT;  Surgeon: John Flake, MD;  Location: [facility];  Service: Orthopedics;  Laterality: Right;   SHOULDER SURGERY Right 2003    Home Medications:  Allergies as of 07/15/2021   No Known Allergies      Medication List        Accurate as of July 15, 2021 10:24 AM. If you have any questions, ask your nurse or doctor.          STOP taking these medications    cyclobenzaprine 10 MG tablet Commonly known as: FLEXERIL       TAKE these medications    allopurinol 100 MG tablet Commonly known as: ZYLOPRIM Take 200 mg  by mouth at bedtime.   atorvastatin 10 MG tablet Commonly known as: LIPITOR Take 1 tablet (10 mg total) by mouth daily.   losartan 25 MG tablet Commonly known as: COZAAR Take 1 tablet by mouth at bedtime.   tamsulosin 0.4 MG Caps capsule Commonly known as: FLOMAX TAKE 1 CAPSULE(0.4 MG) BY MOUTH AT BEDTIME        Allergies: No Known Allergies  Family History: Family History  Problem Relation Age of Onset   Heart disease Father    Prostate cancer Neg Hx    Bladder Cancer Neg Hx    Kidney cancer Neg Hx     Social History:  reports that he has never smoked. He has quit using smokeless tobacco.  His smokeless tobacco use included snuff. He reports that he does not drink alcohol and does not use drugs.   Physical Exam: There were no vitals taken for this visit.  Constitutional:  Alert and oriented, No acute distress. HEENT: [place] AT, moist mucus membranes.  Trachea midline, no masses. Cardiovascular: No clubbing, cyanosis, or edema. Respiratory: Normal respiratory effort, no increased work of breathing. Psychiatric: Normal mood and affect.   Assessment & Plan:    1.  Bilateral nephrolithiasis Stable, asymptomatic Continue annual follow-up  2.  Prostate cancer screening PSA done annually through the city PSA February 2022 was 2.3 which was slightly above baseline in the mid 1 range. He is scheduled for follow-up PSA after the first of the year   John [name] C  John [name], John [name] [address], [place], [place] [address] [phone]

## 2021-07-16 ENCOUNTER — Encounter: Payer: Self-pay | Admitting: Urology

## 2021-07-21 ENCOUNTER — Other Ambulatory Visit: Payer: Self-pay

## 2021-07-21 DIAGNOSIS — E785 Hyperlipidemia, unspecified: Secondary | ICD-10-CM

## 2021-07-21 MED ORDER — ATORVASTATIN CALCIUM 10 MG PO TABS: 10.0000 mg | ORAL_TABLET | Freq: Every day | ORAL | 3 refills | Status: DC

## 2021-07-30 ENCOUNTER — Ambulatory Visit: Payer: Self-pay

## 2021-07-30 ENCOUNTER — Other Ambulatory Visit: Payer: Self-pay

## 2021-07-30 DIAGNOSIS — Z0000 Encounter for general adult medical examination without abnormal findings: Secondary | ICD-10-CM

## 2021-07-30 LAB — POCT URINALYSIS DIPSTICK
Bilirubin, UA: NEGATIVE
Blood, UA: POSITIVE
Glucose, UA: NEGATIVE
Ketones, UA: NEGATIVE
Leukocytes, UA: NEGATIVE
Nitrite, UA: NEGATIVE
Protein, UA: NEGATIVE
Spec Grav, UA: 1.025 (ref 1.010–1.025)
Urobilinogen, UA: 0.2 E.U./dL
pH, UA: 6 (ref 5.0–8.0)

## 2021-07-31 LAB — CMP12+LP+TP+TSH+6AC+PSA+CBC…
ALT: 39 IU/L (ref 0–44)
AST: 29 IU/L (ref 0–40)
Albumin/Globulin Ratio: 1.9 (ref 1.2–2.2)
Albumin: 4.5 g/dL (ref 3.8–4.9)
Alkaline Phosphatase: 90 IU/L (ref 44–121)
BUN/Creatinine Ratio: 8 — ABNORMAL LOW (ref 10–24)
BUN: 11 mg/dL (ref 8–27)
Basophils Absolute: 0.1 10*3/uL (ref 0.0–0.2)
Basos: 1 %
Bilirubin Total: 2.3 mg/dL — ABNORMAL HIGH (ref 0.0–1.2)
Calcium: 9.4 mg/dL (ref 8.6–10.2)
Chloride: 101 mmol/L (ref 96–106)
Chol/HDL Ratio: 3.9 ratio (ref 0.0–5.0)
Cholesterol, Total: 137 mg/dL (ref 100–199)
Creatinine, Ser: 1.36 mg/dL — ABNORMAL HIGH (ref 0.76–1.27)
EOS (ABSOLUTE): 0.2 10*3/uL (ref 0.0–0.4)
Eos: 2 %
Estimated CHD Risk: 0.7 times avg. (ref 0.0–1.0)
Free Thyroxine Index: 2.5 (ref 1.2–4.9)
GGT: 32 IU/L (ref 0–65)
Globulin, Total: 2.4 g/dL (ref 1.5–4.5)
Glucose: 99 mg/dL (ref 70–99)
HDL: 35 mg/dL — ABNORMAL LOW (ref >39)
Hematocrit: 51.3 % — ABNORMAL HIGH (ref 37.5–51.0)
Hemoglobin: 17.4 g/dL (ref 13.0–17.7)
Immature Grans (Abs): 0.1 10*3/uL (ref 0.0–0.1)
Immature Granulocytes: 1 %
Iron: 66 ug/dL (ref 38–169)
LDH: 203 IU/L (ref 121–224)
LDL Chol Calc (NIH): 73 mg/dL (ref 0–99)
Lymphocytes Absolute: 1.6 10*3/uL (ref 0.7–3.1)
Lymphs: 15 %
MCH: 29.8 pg (ref 26.6–33.0)
MCHC: 33.9 g/dL (ref 31.5–35.7)
MCV: 88 fL (ref 79–97)
Monocytes Absolute: 0.8 10*3/uL (ref 0.1–0.9)
Monocytes: 7 %
Neutrophils Absolute: 7.9 10*3/uL — ABNORMAL HIGH (ref 1.4–7.0)
Neutrophils: 74 %
Phosphorus: 3.2 mg/dL (ref 2.8–4.1)
Platelets: 216 10*3/uL (ref 150–450)
Potassium: 4.7 mmol/L (ref 3.5–5.2)
Prostate Specific Ag, Serum: 3.5 ng/mL (ref 0.0–4.0)
RBC: 5.84 x10E6/uL — ABNORMAL HIGH (ref 4.14–5.80)
RDW: 14.1 % (ref 11.6–15.4)
Sodium: 138 mmol/L (ref 134–144)
T3 Uptake Ratio: 32 % (ref 24–39)
T4, Total: 7.8 ug/dL (ref 4.5–12.0)
TSH: 1.72 u[IU]/mL (ref 0.450–4.500)
Total Protein: 6.9 g/dL (ref 6.0–8.5)
Triglycerides: 166 mg/dL — ABNORMAL HIGH (ref 0–149)
Uric Acid: 4.5 mg/dL (ref 3.8–8.4)
VLDL Cholesterol Cal: 29 mg/dL (ref 5–40)
WBC: 10.5 10*3/uL (ref 3.4–10.8)
eGFR: 60 mL/min/{1.73_m2} (ref >59)

## 2021-07-31 LAB — HGB A1C W/O EAG: Hgb A1c MFr Bld: 5.8 % — ABNORMAL HIGH (ref 4.8–5.6)

## 2021-08-02 ENCOUNTER — Ambulatory Visit: Payer: Self-pay | Admitting: Physician Assistant

## 2021-08-02 ENCOUNTER — Encounter: Payer: Self-pay | Admitting: Physician Assistant

## 2021-08-02 ENCOUNTER — Other Ambulatory Visit: Payer: Self-pay

## 2021-08-02 VITALS — BP 123/85 | HR 90 | Temp 97.6°F | Resp 14 | Ht 72.0 in | Wt 220.0 lb

## 2021-08-02 DIAGNOSIS — R1031 Right lower quadrant pain: Secondary | ICD-10-CM

## 2021-08-02 MED ORDER — ROSUVASTATIN CALCIUM 20 MG PO TABS: 20.0000 mg | ORAL_TABLET | Freq: Every day | ORAL | 3 refills | Status: DC

## 2021-08-02 NOTE — Progress Notes (Deleted)
Pt concerned he might have hernia lower abd. Pt agreed to having flu and tetanus vaccine./CL,RMA

## 2021-08-02 NOTE — Progress Notes (Signed)
   Subjective: Annual physical exam    Patient ID: John [name], male    DOB: 1961/04/01, 60 y.o.   MRN: [id number]  HPI Patient presents for annual physical exam.  Patient voices concerned for bilateral inguinal pain for 6 months.  Patient denies urinary complaint.  Patient stated pain radiates to the scrotum.   Review of Systems Hyperlipidemia, hypertension, prediabetic.        Physical Exam No acute distress.  Temperature 97.6, pulse 90, respiration 14, BP is 123/85, patient 94% O2 sat on room air.  Patient weighs 222 pounds and BMI is 29.84.  HEENT is unremarkable.  Neck is supple without lymphadenopathy or bruits.  Lungs are clear to auscultation.  Heart regular rate and rhythm.  No acute findings on EKG. Abdomen is negative HSM, normoactive bowel sounds, soft, with moderate guarding palpation of bilateral inguinal area.  No inguinal mass palpated. No obvious deformity to the upper or lower extremities.  Patient has full and equal range of motion of the upper and lower extremities. No obvious cervical or lumbar spine deformity.  Patient has full and equal range of motion cervical lumbar spine. Cranial nerves II through XII are grossly intact.  DTRs are 2+ without clonus.        Assessment & Plan: Well exam.   Concern for inguinal strain versus hernia.  Further evaluation with ultrasound is warranted.  Discussed lab results with patient.  Patient is currently taking 10 mg of Crestor without any noticeable improvement triglycerides.  Patient is amendable to increase Crestor to 20 mg.  Also discussed hemoglobin A1c levels consistent with prediabetes.  Advised every 6 months monitoring secondary to family history of diabetes.

## 2021-08-03 NOTE — Addendum Note (Signed)
Addended by: Gardner Candle on: 08/03/2021 11:30 AM   Modules accepted: Orders

## 2021-08-03 NOTE — Addendum Note (Signed)
Addended by: Gardner Candle on: 08/03/2021 11:23 AM   Modules accepted: Orders

## 2021-08-03 NOTE — Addendum Note (Signed)
Addended by: Gardner Candle on: 08/03/2021 11:24 AM   Modules accepted: Orders

## 2021-08-03 NOTE — Progress Notes (Signed)
Ultrasound appointment scheduled for 08/11/21 at 3:30 pm with an arrival time of 3:00 pm at [facility].  Alinda Money notified.  AMD

## 2021-08-10 ENCOUNTER — Other Ambulatory Visit: Payer: Self-pay | Admitting: Physician Assistant

## 2021-08-10 DIAGNOSIS — R1032 Left lower quadrant pain: Secondary | ICD-10-CM

## 2021-08-10 DIAGNOSIS — R1031 Right lower quadrant pain: Secondary | ICD-10-CM

## 2021-08-11 ENCOUNTER — Ambulatory Visit
Admission: RE | Admit: 2021-08-11 | Discharge: 2021-08-11 | Disposition: A | Discharge status: Home / Self Care | Payer: 59 | Source: Ambulatory Visit | Attending: Physician Assistant | Admitting: Physician Assistant

## 2021-08-11 DIAGNOSIS — R1032 Left lower quadrant pain: Secondary | ICD-10-CM | POA: Diagnosis not present

## 2021-08-11 DIAGNOSIS — K4090 Unilateral inguinal hernia, without obstruction or gangrene, not specified as recurrent: Secondary | ICD-10-CM | POA: Diagnosis not present

## 2021-08-11 DIAGNOSIS — R1031 Right lower quadrant pain: Secondary | ICD-10-CM | POA: Insufficient documentation

## 2021-08-11 DIAGNOSIS — R1030 Lower abdominal pain, unspecified: Secondary | ICD-10-CM | POA: Diagnosis not present

## 2021-09-27 ENCOUNTER — Other Ambulatory Visit: Payer: Self-pay | Admitting: Urology

## 2021-09-27 DIAGNOSIS — N200 Calculus of kidney: Secondary | ICD-10-CM

## 2021-10-17 ENCOUNTER — Telehealth: Payer: Self-pay | Admitting: Urology

## 2021-10-17 NOTE — Telephone Encounter (Signed)
PSA October 2022 was elevated above baseline at 3.5.  Recommend lab visit with a repeat PSA April 2023

## 2021-10-18 ENCOUNTER — Encounter: Payer: Self-pay | Admitting: *Deleted

## 2021-11-08 DIAGNOSIS — N200 Calculus of kidney: Secondary | ICD-10-CM | POA: Diagnosis not present

## 2021-11-08 DIAGNOSIS — M109 Gout, unspecified: Secondary | ICD-10-CM | POA: Diagnosis not present

## 2021-11-08 DIAGNOSIS — N1831 Chronic kidney disease, stage 3a: Secondary | ICD-10-CM | POA: Diagnosis not present

## 2021-11-08 DIAGNOSIS — I10 Essential (primary) hypertension: Secondary | ICD-10-CM | POA: Diagnosis not present

## 2021-12-23 DIAGNOSIS — R1032 Left lower quadrant pain: Secondary | ICD-10-CM | POA: Diagnosis not present

## 2022-01-19 ENCOUNTER — Other Ambulatory Visit: Payer: Self-pay

## 2022-01-19 DIAGNOSIS — R972 Elevated prostate specific antigen [PSA]: Secondary | ICD-10-CM

## 2022-01-20 ENCOUNTER — Other Ambulatory Visit: Payer: 59

## 2022-01-20 DIAGNOSIS — R972 Elevated prostate specific antigen [PSA]: Secondary | ICD-10-CM

## 2022-01-21 LAB — PSA: Prostate Specific Ag, Serum: 2.3 ng/mL (ref 0.0–4.0)

## 2022-01-25 ENCOUNTER — Telehealth: Payer: Self-pay | Admitting: *Deleted

## 2022-01-25 NOTE — Telephone Encounter (Signed)
-----   Message from Harle Battiest, PA-C sent at 01/25/2022  9:20 AM EDT ----- ?Please let Mr. John [name] know that his PSA has decreased from 3.5-2.3 over the last 5 months.  We will need to repeat it again when he sees Dr. Lonna Cobb in October. ?

## 2022-01-25 NOTE — Telephone Encounter (Signed)
Notified patient as instructed, patient pleased. Discussed follow-up appointments, patient agrees  

## 2022-02-10 DIAGNOSIS — H02881 Meibomian gland dysfunction right upper eyelid: Secondary | ICD-10-CM | POA: Diagnosis not present

## 2022-02-10 DIAGNOSIS — H02882 Meibomian gland dysfunction right lower eyelid: Secondary | ICD-10-CM | POA: Diagnosis not present

## 2022-02-10 DIAGNOSIS — H04123 Dry eye syndrome of bilateral lacrimal glands: Secondary | ICD-10-CM | POA: Diagnosis not present

## 2022-05-16 DIAGNOSIS — I10 Essential (primary) hypertension: Secondary | ICD-10-CM | POA: Diagnosis not present

## 2022-05-16 DIAGNOSIS — M109 Gout, unspecified: Secondary | ICD-10-CM | POA: Diagnosis not present

## 2022-05-16 DIAGNOSIS — N200 Calculus of kidney: Secondary | ICD-10-CM | POA: Diagnosis not present

## 2022-05-16 DIAGNOSIS — N1831 Chronic kidney disease, stage 3a: Secondary | ICD-10-CM | POA: Diagnosis not present

## 2022-05-16 DIAGNOSIS — N2581 Secondary hyperparathyroidism of renal origin: Secondary | ICD-10-CM | POA: Diagnosis not present

## 2022-06-27 ENCOUNTER — Other Ambulatory Visit: Payer: Self-pay | Admitting: *Deleted

## 2022-06-27 DIAGNOSIS — N200 Calculus of kidney: Secondary | ICD-10-CM

## 2022-06-27 MED ORDER — TAMSULOSIN HCL 0.4 MG PO CAPS: ORAL_CAPSULE | ORAL | 2 refills | Status: DC

## 2022-07-11 ENCOUNTER — Other Ambulatory Visit: Payer: Self-pay | Admitting: *Deleted

## 2022-07-11 DIAGNOSIS — N200 Calculus of kidney: Secondary | ICD-10-CM

## 2022-07-15 ENCOUNTER — Ambulatory Visit: Payer: 59 | Admitting: Urology

## 2022-08-10 ENCOUNTER — Ambulatory Visit
Admission: RE | Admit: 2022-08-10 | Discharge: 2022-08-10 | Disposition: A | Discharge status: Home / Self Care | Payer: 59 | Source: Ambulatory Visit | Attending: Urology | Admitting: Urology

## 2022-08-10 ENCOUNTER — Ambulatory Visit (INDEPENDENT_AMBULATORY_CARE_PROVIDER_SITE_OTHER): Payer: 59 | Admitting: Urology

## 2022-08-10 ENCOUNTER — Encounter: Payer: Self-pay | Admitting: Urology

## 2022-08-10 VITALS — BP 125/77 | HR 77 | Ht 72.0 in | Wt 225.0 lb

## 2022-08-10 DIAGNOSIS — N200 Calculus of kidney: Secondary | ICD-10-CM | POA: Diagnosis not present

## 2022-08-10 DIAGNOSIS — N401 Enlarged prostate with lower urinary tract symptoms: Secondary | ICD-10-CM

## 2022-08-10 MED ORDER — ALLOPURINOL 100 MG PO TABS: 200.0000 mg | ORAL_TABLET | Freq: Every day | ORAL | 2 refills | Status: DC

## 2022-08-10 MED ORDER — TAMSULOSIN HCL 0.4 MG PO CAPS: ORAL_CAPSULE | ORAL | 2 refills | Status: DC

## 2022-08-10 NOTE — Progress Notes (Unsigned)
08/10/2022 11:07 AM   Faylene Million Dais Dec 09, 1960 [id number]  Referring provider: Jaclyn Shaggy, MD [address]   [place],  [place] [address]  Chief Complaint  Patient presents with   Nephrolithiasis    Urologic history: 1.  Recurrent stone disease -Bilateral nonobstructing renal calculi -Cystolitholapaxy 12/2018 -Ureteroscopic stone removal 10/2017  HPI: 61 y.o. male presents for annual follow-up.  Doing well since last visit No bothersome LUTS Denies dysuria, gross hematuria Denies flank, abdominal or pelvic pain KUB performed today reviewed and stable, bilateral renal calculi   PMH: Past Medical History:  Diagnosis Date   History of kidney stones    Hyperlipidemia    Hypertension     Surgical History: Past Surgical History:  Procedure Laterality Date   BACK SURGERY     CHOLECYSTECTOMY     CYSTOSCOPY WITH LITHOLAPAXY N/A 12/18/2018   Procedure: CYSTOSCOPY WITH LITHOLAPAXY;  Surgeon: Riki Altes, MD;  Location: [facility];  Service: Urology;  Laterality: N/A;   CYSTOSCOPY/URETEROSCOPY/HOLMIUM LASER/STENT PLACEMENT Left 10/12/2017   Procedure: CYSTOSCOPY/URETEROSCOPY/HOLMIUM LASER/STENT PLACEMENT;  Surgeon: Riki Altes, MD;  Location: [facility];  Service: Urology;  Laterality: Left;   LUMBAR FUSION     L5S1   SHOULDER ARTHROSCOPY WITH SUBACROMIAL DECOMPRESSION Right 10/27/2016   Procedure: SHOULDER ARTHROSCOPY WITH SUBACROMIAL DECOMPRESSION  AND DEBRIDEMENT;  Surgeon: Christena Flake, MD;  Location: [facility];  Service: Orthopedics;  Laterality: Right;   SHOULDER SURGERY Right 2003    Home Medications:  Allergies as of 08/10/2022   No Known Allergies      Medication List        Accurate as of August 10, 2022 11:07 AM. If you have any questions, ask your nurse or doctor.          allopurinol 100 MG tablet Commonly known as: ZYLOPRIM Take 200 mg by mouth at bedtime.   losartan 50 MG tablet Commonly known as: COZAAR Take 50 mg by mouth  daily.   rosuvastatin 20 MG tablet Commonly known as: Crestor Take 1 tablet (20 mg total) by mouth daily.   tamsulosin 0.4 MG Caps capsule Commonly known as: FLOMAX TAKE 1 CAPSULE(0.4 MG) BY MOUTH AT BEDTIME        Allergies: No Known Allergies  Family History: Family History  Problem Relation Age of Onset   Heart disease Father    Prostate cancer Neg Hx    Bladder Cancer Neg Hx    Kidney cancer Neg Hx     Social History:  reports that he has never smoked. He has quit using smokeless tobacco.  His smokeless tobacco use included snuff. He reports that he does not drink alcohol and does not use drugs.   Physical Exam: BP 125/77   Pulse 77   Ht 6' (1.829 m)   Wt 225 lb (102.1 kg)   BMI 30.52 kg/m   Constitutional:  Alert and oriented, No acute distress. HEENT: [place] AT, moist mucus membranes.  Trachea midline, no masses. Cardiovascular: No clubbing, cyanosis, or edema. Respiratory: Normal respiratory effort, no increased work of breathing. Psychiatric: Normal mood and affect.   Assessment & Plan:    1.  Bilateral nephrolithiasis Stable, asymptomatic Continue annual follow-up  2.  Prostate cancer screening PSA done annually through the city PSA February 2022 was 2.3 which was slightly above baseline in the mid 1 range. He is scheduled for follow-up PSA after the first of the year   Riki Altes, MD  [facility]  [address], [place], [place] [address] [phone]

## 2022-08-11 ENCOUNTER — Encounter: Payer: Self-pay | Admitting: Urology

## 2022-09-08 DIAGNOSIS — N200 Calculus of kidney: Secondary | ICD-10-CM | POA: Diagnosis not present

## 2022-09-08 DIAGNOSIS — M109 Gout, unspecified: Secondary | ICD-10-CM | POA: Diagnosis not present

## 2022-09-08 DIAGNOSIS — I10 Essential (primary) hypertension: Secondary | ICD-10-CM | POA: Diagnosis not present

## 2022-09-08 DIAGNOSIS — N1831 Chronic kidney disease, stage 3a: Secondary | ICD-10-CM | POA: Diagnosis not present

## 2022-09-16 ENCOUNTER — Ambulatory Visit: Payer: Self-pay

## 2022-09-16 DIAGNOSIS — Z0000 Encounter for general adult medical examination without abnormal findings: Secondary | ICD-10-CM

## 2022-09-16 LAB — POCT URINALYSIS DIPSTICK
Bilirubin, UA: NEGATIVE
Blood, UA: POSITIVE
Glucose, UA: NEGATIVE
Ketones, UA: NEGATIVE
Leukocytes, UA: NEGATIVE
Nitrite, UA: NEGATIVE
Protein, UA: NEGATIVE
Spec Grav, UA: >=1.03 — AB (ref 1.010–1.025)
Urobilinogen, UA: 0.2 E.U./dL
pH, UA: 5.5 (ref 5.0–8.0)

## 2022-09-17 LAB — CMP12+LP+TP+TSH+6AC+PSA+CBC…
ALT: 29 IU/L (ref 0–44)
AST: 20 IU/L (ref 0–40)
Albumin/Globulin Ratio: 2 (ref 1.2–2.2)
Albumin: 4.7 g/dL (ref 3.9–4.9)
Alkaline Phosphatase: 78 IU/L (ref 44–121)
BUN/Creatinine Ratio: 10 (ref 10–24)
BUN: 14 mg/dL (ref 8–27)
Basophils Absolute: 0.1 10*3/uL (ref 0.0–0.2)
Basos: 1 %
Bilirubin Total: 1.8 mg/dL — ABNORMAL HIGH (ref 0.0–1.2)
Calcium: 9.5 mg/dL (ref 8.6–10.2)
Chloride: 104 mmol/L (ref 96–106)
Chol/HDL Ratio: 3.1 ratio (ref 0.0–5.0)
Cholesterol, Total: 123 mg/dL (ref 100–199)
Creatinine, Ser: 1.42 mg/dL — ABNORMAL HIGH (ref 0.76–1.27)
EOS (ABSOLUTE): 0.2 10*3/uL (ref 0.0–0.4)
Eos: 2 %
Estimated CHD Risk: <0.5 times avg. (ref 0.0–1.0)
Free Thyroxine Index: 2.3 (ref 1.2–4.9)
GGT: 31 IU/L (ref 0–65)
Globulin, Total: 2.3 g/dL (ref 1.5–4.5)
Glucose: 93 mg/dL (ref 70–99)
HDL: 40 mg/dL (ref >39)
Hematocrit: 51.3 % — ABNORMAL HIGH (ref 37.5–51.0)
Hemoglobin: 17 g/dL (ref 13.0–17.7)
Immature Grans (Abs): 0 10*3/uL (ref 0.0–0.1)
Immature Granulocytes: 0 %
Iron: 70 ug/dL (ref 38–169)
LDH: 143 IU/L (ref 121–224)
LDL Chol Calc (NIH): 56 mg/dL (ref 0–99)
Lymphocytes Absolute: 2.2 10*3/uL (ref 0.7–3.1)
Lymphs: 26 %
MCH: 29.8 pg (ref 26.6–33.0)
MCHC: 33.1 g/dL (ref 31.5–35.7)
MCV: 90 fL (ref 79–97)
Monocytes Absolute: 0.8 10*3/uL (ref 0.1–0.9)
Monocytes: 9 %
Neutrophils Absolute: 5.5 10*3/uL (ref 1.4–7.0)
Neutrophils: 62 %
Phosphorus: 3.9 mg/dL (ref 2.8–4.1)
Platelets: 258 10*3/uL (ref 150–450)
Potassium: 4.2 mmol/L (ref 3.5–5.2)
Prostate Specific Ag, Serum: 4.7 ng/mL — ABNORMAL HIGH (ref 0.0–4.0)
RBC: 5.7 x10E6/uL (ref 4.14–5.80)
RDW: 14.1 % (ref 11.6–15.4)
Sodium: 142 mmol/L (ref 134–144)
T3 Uptake Ratio: 29 % (ref 24–39)
T4, Total: 8.1 ug/dL (ref 4.5–12.0)
TSH: 3.03 u[IU]/mL (ref 0.450–4.500)
Total Protein: 7 g/dL (ref 6.0–8.5)
Triglycerides: 160 mg/dL — ABNORMAL HIGH (ref 0–149)
Uric Acid: 4.9 mg/dL (ref 3.8–8.4)
VLDL Cholesterol Cal: 27 mg/dL (ref 5–40)
WBC: 8.8 10*3/uL (ref 3.4–10.8)
eGFR: 56 mL/min/{1.73_m2} — ABNORMAL LOW (ref >59)

## 2022-09-19 ENCOUNTER — Other Ambulatory Visit: Payer: Self-pay

## 2022-09-19 ENCOUNTER — Encounter: Payer: Self-pay | Admitting: Physician Assistant

## 2022-09-19 ENCOUNTER — Ambulatory Visit: Payer: Self-pay | Admitting: Physician Assistant

## 2022-09-19 VITALS — BP 139/82 | HR 73 | Temp 97.6°F | Resp 14 | Ht 72.0 in | Wt 220.0 lb

## 2022-09-19 DIAGNOSIS — N400 Enlarged prostate without lower urinary tract symptoms: Secondary | ICD-10-CM

## 2022-09-19 DIAGNOSIS — I10 Essential (primary) hypertension: Secondary | ICD-10-CM

## 2022-09-19 DIAGNOSIS — Z0000 Encounter for general adult medical examination without abnormal findings: Secondary | ICD-10-CM

## 2022-09-19 DIAGNOSIS — E782 Mixed hyperlipidemia: Secondary | ICD-10-CM

## 2022-09-19 MED ORDER — ROSUVASTATIN CALCIUM 20 MG PO TABS: 20.0000 mg | ORAL_TABLET | Freq: Every day | ORAL | 3 refills | Status: DC

## 2022-09-19 NOTE — Progress Notes (Signed)
Pt presents today to complete physical. Pt denies any issues or concerns at this time/CLRMA

## 2022-09-19 NOTE — Progress Notes (Signed)
[facility] Emergency Department Provider Note   ____________________________________________   None    (approximate)  I have reviewed the triage vital signs and the nursing notes.   HISTORY  Chief Complaint Annual Exam  HPI John [name] is a 61 y.o. male patient presents for annual physical exam.  Patient voiced no concerns or complaints.         Past Medical History:  Diagnosis Date   History of kidney stones    Hyperlipidemia    Hypertension     Patient Active Problem List   Diagnosis Date Noted   Benign prostatic hyperplasia without lower urinary tract symptoms 10/29/2018   Gilbert's syndrome 10/29/2018   Gout involving toe 10/29/2018   Displacement of lumbosacral intervertebral disc with myelopathy 01/01/2018   Lumbar degenerative disc disease 01/01/2018   Right sided sciatica 01/01/2018   Postlaminectomy syndrome of lumbar region 01/01/2018   BMI 30.0-30.9,adult 12/28/2017   Chronic kidney disease, stage 3 unspecified ([place]) 12/28/2017   Hyperlipidemia, mixed 12/28/2017   Essential hypertension 12/28/2017   Tobacco use 12/28/2017   Renal stone 10/07/2017   Cubital tunnel syndrome on right 12/12/2016   Degenerative tear of glenoid labrum of right shoulder 10/27/2016   Rotator cuff tendinitis, right 09/09/2016   Disorder of calcium metabolism 07/09/2012    Past Surgical History:  Procedure Laterality Date   BACK SURGERY     CHOLECYSTECTOMY     CYSTOSCOPY WITH LITHOLAPAXY N/A 12/18/2018   Procedure: CYSTOSCOPY WITH LITHOLAPAXY;  Surgeon: Abbie Sons, MD;  Location: [facility] ORS;  Service: Urology;  Laterality: N/A;   CYSTOSCOPY/URETEROSCOPY/HOLMIUM LASER/STENT PLACEMENT Left 10/12/2017   Procedure: CYSTOSCOPY/URETEROSCOPY/HOLMIUM LASER/STENT PLACEMENT;  Surgeon: Abbie Sons, MD;  Location: [facility] ORS;  Service: Urology;  Laterality: Left;   LUMBAR FUSION     L5S1   SHOULDER ARTHROSCOPY WITH SUBACROMIAL DECOMPRESSION Right  10/27/2016   Procedure: SHOULDER ARTHROSCOPY WITH SUBACROMIAL DECOMPRESSION  AND DEBRIDEMENT;  Surgeon: Corky Mull, MD;  Location: [facility] ORS;  Service: Orthopedics;  Laterality: Right;   SHOULDER SURGERY Right 2003    Prior to Admission medications   Medication Sig Start Date End Date Taking? Authorizing Provider  allopurinol (ZYLOPRIM) 100 MG tablet Take 2 tablets (200 mg total) by mouth at bedtime. 08/10/22  Yes Stoioff, Ronda Fairly, MD  losartan (COZAAR) 50 MG tablet Take 50 mg by mouth daily. 06/19/21  Yes [provider]  rosuvastatin (CRESTOR) 20 MG tablet Take 1 tablet (20 mg total) by mouth daily. 08/02/21  Yes Sable Feil, PA-C  tamsulosin (FLOMAX) 0.4 MG CAPS capsule TAKE 1 CAPSULE(0.4 MG) BY MOUTH AT BEDTIME 08/10/22  Yes Stoioff, Ronda Fairly, MD    Allergies Patient has no known allergies.  Family History  Problem Relation Age of Onset   Heart disease Father    Prostate cancer Neg Hx    Bladder Cancer Neg Hx    Kidney cancer Neg Hx     Social History Social History   Tobacco Use   Smoking status: Never   Smokeless tobacco: Former    Types: Snuff   Tobacco comments:    Quit 04/2019  Vaping Use   Vaping Use: Never used  Substance Use Topics   Alcohol use: No   Drug use: No    Review of Systems Constitutional: No fever/chills Eyes: No visual changes. ENT: No sore throat. Cardiovascular: Denies chest pain. Respiratory: Denies shortness of breath. Gastrointestinal: No abdominal pain.  No nausea, no vomiting.  No diarrhea.  No constipation. Genitourinary: Negative for dysuria.  BPH. Musculoskeletal: Negative for back pain. Skin: Negative for rash. Neurological: Negative for headaches, focal weakness or numbness. Endocrine: Hyperlipidemia and hypertension ____________________________________________   PHYSICAL EXAM:  VITAL SIGNS: BP is 139/82, pulse 73, respiration 14, temperature 97.6, patient 96% O2 sat on room air.  Patient weighs 220 pounds and BMI  is 29.84. Constitutional: Alert and oriented. Well appearing and in no acute distress. Eyes: Conjunctivae are normal. PERRL. EOMI. Head: Atraumatic. Nose: No congestion/rhinnorhea. Mouth/Throat: Mucous membranes are moist.  Oropharynx non-erythematous. Neck: No stridor.  No cervical spine tenderness to palpation. Hematological/Lymphatic/Immunilogical: No cervical lymphadenopathy. Cardiovascular: Normal rate, regular rhythm. Grossly normal heart sounds.  Good peripheral circulation. Respiratory: Normal respiratory effort.  No retractions. Lungs CTAB. Gastrointestinal: Soft and nontender. No distention. No abdominal bruits. No CVA tenderness. Genitourinary: Deferred Musculoskeletal: No lower extremity tenderness nor edema.  No joint effusions. Neurologic:  Normal speech and language. No gross focal neurologic deficits are appreciated. No gait instability. Skin:  Skin is warm, dry and intact. No rash noted. Psychiatric: Mood and affect are normal. Speech and behavior are normal.  ____________________________________________   LABS             Component Ref Range & Units 3 d ago (09/16/22) 8 mo ago (01/20/22) 1 yr ago (07/30/21) 1 yr ago (11/30/20) 3 yr ago (07/18/19) 4 yr ago (02/24/18) 4 yr ago (02/24/18)  Glucose 70 - 99 mg/dL 93  99 96 R 103 High  R 128 High  R   Uric Acid 3.8 - 8.4 mg/dL 4.9  4.5 CM 4.9 CM 5.6 R, CM    Comment:            Therapeutic target for gout patients: <6.0  BUN 8 - 27 mg/dL _0 R 15 R   Creatinine, Ser 0.76 - 1.27 mg/dL 1.42 High   1.36 High  1.36 High  1.11 1.97 High  R   eGFR >59 mL/min/1.73 56 Low   60 60 CM     BUN/Creatinine Ratio 10 - _1 Low  12 11 R    Sodium 134 - 144 mmol/L 142  138 142 139 138 R   Potassium 3.5 - 5.2 mmol/L 4.2  4.7 4.7 4.7 4.2 R   Chloride 96 - 106 mmol/L 104  101 103 103 106 R   Calcium 8.6 - 10.2 mg/dL 9.5  9.4 9.9 9.5 R 8.8 Low  R   Phosphorus 2.8 - 4.1 mg/dL 3.9  3.2 4.2 High  3.1    Total Protein 6.0 -  8.5 g/dL 7.0  6.9 7.1 6.9 7.4 R   Albumin 3.9 - 4.9 g/dL 4.7  4.5 R 4.7 R 4.5 R 4.1 R   Globulin, Total 1.5 - 4.5 g/dL 2.3  2.4 2.4 2.4    Albumin/Globulin Ratio 1.2 - 2.2 2.0  1.9 2.0 1.9    Bilirubin Total 0.0 - 1.2 mg/dL 1.8 High   2.3 High  1.5 High  1.6 High  2.0 High  R   Alkaline Phosphatase 44 - 121 IU/L 78  90 91 94 R 69 R   LDH 121 - 224 IU/L 143  203 163 174    AST 0 - 40 IU/L _2 R   ALT 0 - 44 IU/L 29  39 39 51 High  29 R   GGT 0 - 65 IU/L 31  32 36 42    Iron  38 - 169 ug/dL 70  66 72 84    Cholesterol, Total 100 - 199 mg/dL 123  137 145 138    Triglycerides 0 - 149 mg/dL 160 High   166 High  159 High  160 High     HDL >39 mg/dL 40  35 Low  38 Low  33 Low     VLDL Cholesterol Cal 5 - 40 mg/dL _0 LDL Chol Calc (NIH) 0 - 99 mg/dL 56  73 79 77    Chol/HDL Ratio 0.0 - 5.0 ratio 3.1  3.9 CM 3.8 CM 4.2 CM    Comment:                                   T. Chol/HDL Ratio                                             Men  Women                               1/2 Avg.Risk  3.4    3.3                                   Avg.Risk  5.0    4.4                                2X Avg.Risk  9.6    7.1                                3X Avg.Risk 23.4   11.0  Estimated CHD Risk 0.0 - 1.0 times avg.  < 0.5  0.7 CM 0.7 CM 0.8 CM    Comment: The CHD Risk is based on the T. Chol/HDL ratio. Other factors affect CHD Risk such as hypertension, smoking, diabetes, severe obesity, and family history of premature CHD.  TSH 0.450 - 4.500 uIU/mL 3.030  1.720 2.830 1.990    T4, Total 4.5 - 12.0 ug/dL 8.1  7.8 7.1 8.3    T3 Uptake Ratio 24 - 39 % 29  32 29 38    Free Thyroxine Index 1.2 - 4.9 2.3  2.5 2.1 3.2    Prostate Specific Ag, Serum 0.0 - 4.0 ng/mL 4.7 High  2.3 CM 3.5 CM 2.3 CM 1.5 CM    Comment: Roche ECLIA methodology. According to the American Urological Association, Serum PSA should decrease and remain at undetectable levels after radical prostatectomy. The AUA defines  biochemical recurrence as an initial PSA value 0.2 ng/mL or greater followed by a subsequent confirmatory PSA value 0.2 ng/mL or greater. Values obtained with different assay methods or kits cannot be used interchangeably. Results cannot be interpreted as absolute evidence of the presence or absence of malignant disease.  WBC 3.4 - 10.8 x10E3/uL 8.8  10.5 9.7 8.7  16.3 High  R  RBC 4.14 - 5.80 x10E6/uL 5.70  5.84 High  6.20 High  5.87 High   5.48 R  Hemoglobin 13.0 - 17.7 g/dL 17.0  17.4 18.3 High  16.9  16.2 R  Hematocrit 37.5 - 51.0 % 51.3 High   51.3 High  55.1 High  50.7  48.0 R  MCV 79 - 97 fL 90  88 89 86  87.5 R  MCH 26.6 - 33.0 pg 29.8  29.8 29.5 28.8  29.6 R  MCHC 31.5 - 35.7 g/dL 33.1  33.9 33.2 33.3  33.9 R  RDW 11.6 - 15.4 % 14.1  14.1 14.4 15.1  14.8 High  R  Platelets 150 - 450 x10E3/uL 258  216 314 295  254 R  Neutrophils Not Estab. % 62  74 63 68  84 R  Lymphs Not Estab. % _0 Monocytes Not Estab. % _1 Eos Not Estab. % _2 Basos Not Estab. % _3 Neutrophils Absolute 1.4 - 7.0 x10E3/uL 5.5  7.9 High  6.1 6.0  13.6 High  R  Lymphocytes Absolute 0.7 - 3.1 x10E3/uL 2.2  1.6 2.5 1.6  1.2 R  Monocytes Absolute 0.1 - 0.9 x10E3/uL 0.8  0.8 0.7 0.8    EOS (ABSOLUTE) 0.0 - 0.4 x10E3/uL 0.2  0.2 0.2 0.2    Basophils Absolute 0.0 - 0.2 x10E3/uL 0.1  0.1 0.1 0.0  0.0 R, CM  Immature Granulocytes Not Estab. % 0  _4 ___________________________________________  EKG  Sinus bradycardia 59 bpm ____________________________________________    ____________________________________________   INITIAL IMPRESSION / ASSESSMENT AND PLAN  As part of my medical decision making, I reviewed the following data within the [facility]      Discussed lab results with patient.  Patient PSA has doubled to 4.7.  Patient has been followed by urologist.  No acute findings on EKG.         ____________________________________________   FINAL CLINICAL IMPRESSION Well exam  ED Discharge Orders     None        Note:  This document was prepared using Dragon voice recognition software and may include unintentional dictation errors.

## 2023-01-10 DIAGNOSIS — N200 Calculus of kidney: Secondary | ICD-10-CM | POA: Diagnosis not present

## 2023-01-10 DIAGNOSIS — I10 Essential (primary) hypertension: Secondary | ICD-10-CM | POA: Diagnosis not present

## 2023-01-10 DIAGNOSIS — N1831 Chronic kidney disease, stage 3a: Secondary | ICD-10-CM | POA: Diagnosis not present

## 2023-01-10 DIAGNOSIS — M109 Gout, unspecified: Secondary | ICD-10-CM | POA: Diagnosis not present

## 2023-03-20 IMAGING — CR DG ABDOMEN 1V
3 series · 3 of 3 positions shown · non-contrast
Comparison: 07/15/2020; 07/19/2019; 10/31/2018; CT abdomen
pelvis-02/24/2018

CLINICAL DATA: Follow-up renal stones.

EXAM:
ABDOMEN - 1 VIEW

[abdomen kub (1 of 3)]
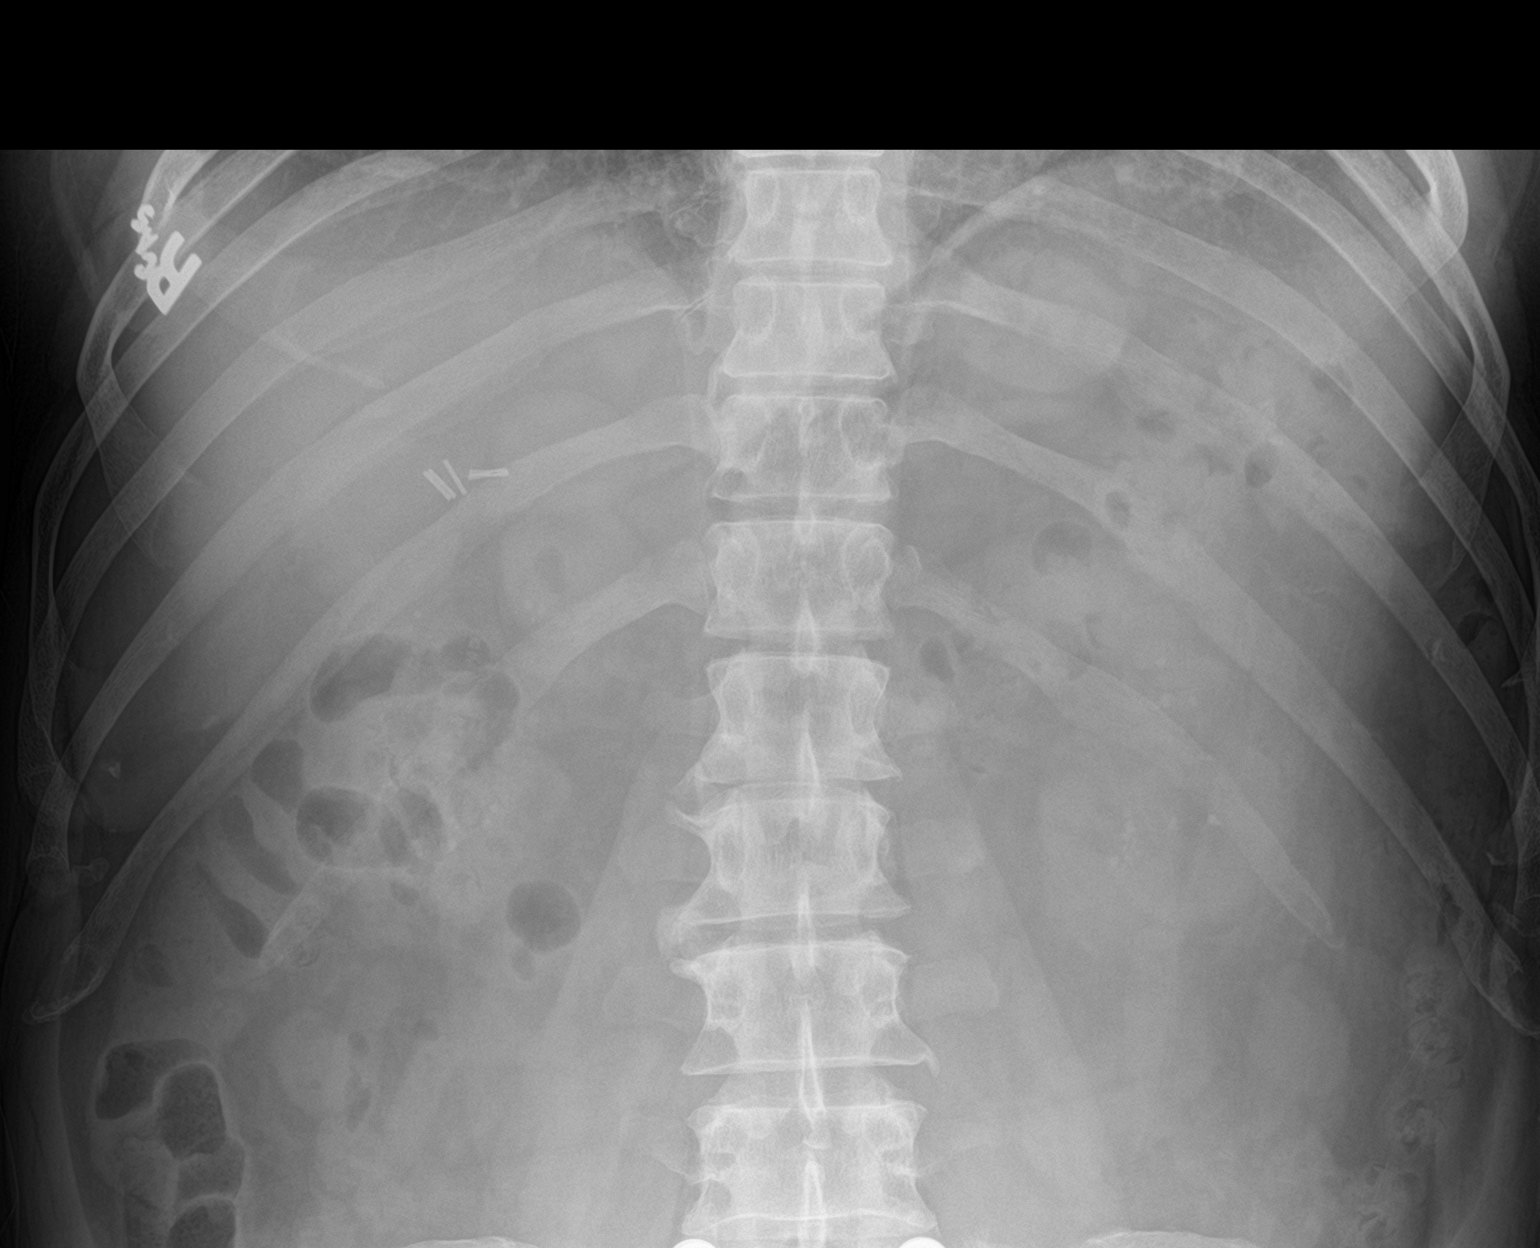

[abdomen kub (2 of 3)]
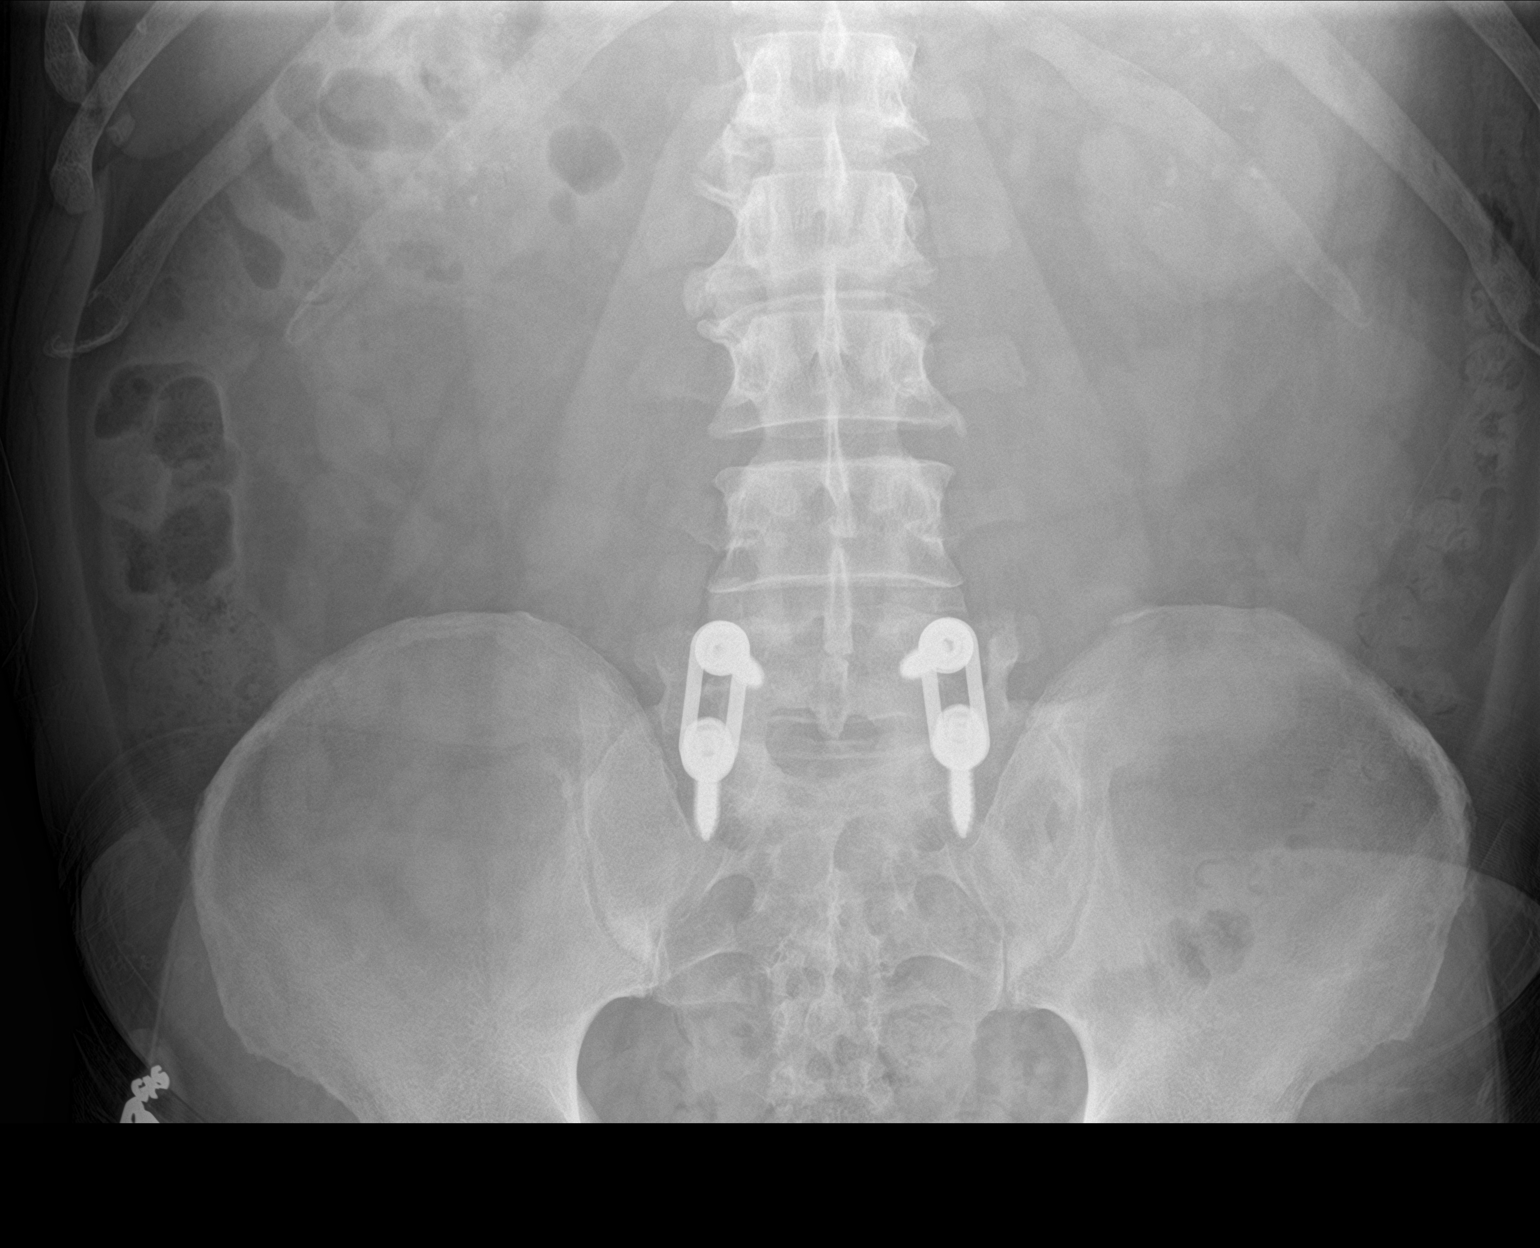

[abdomen kub (3 of 3)]
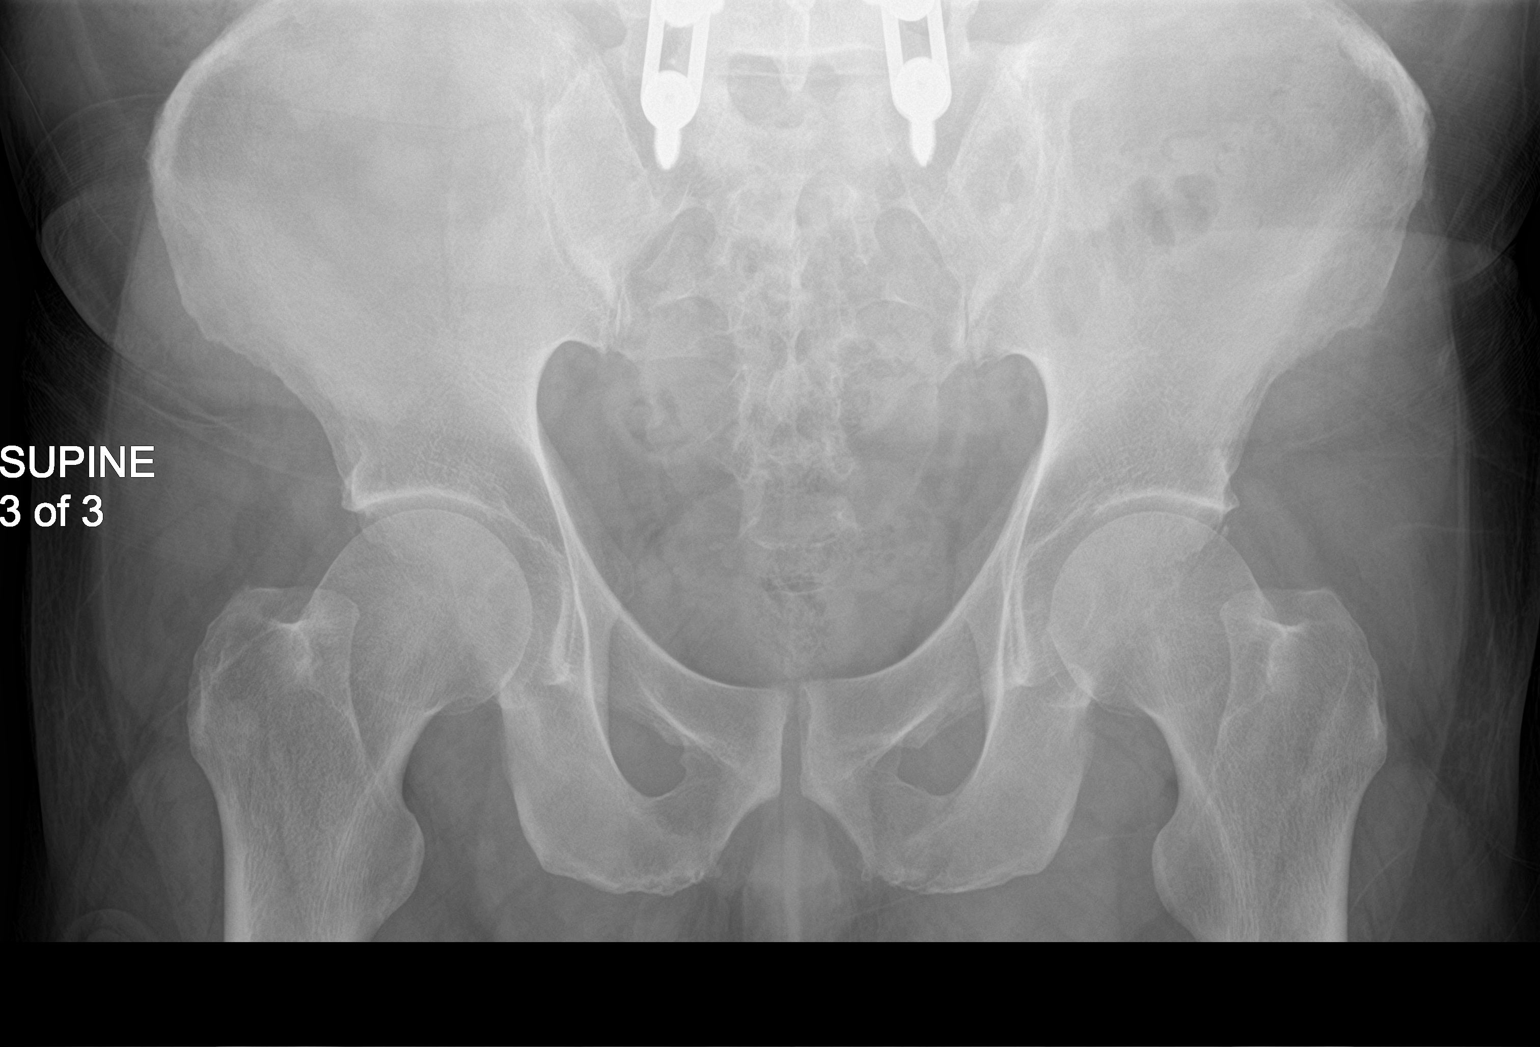

[3 of 3 positions shown; findings below may reference images not displayed]

FINDINGS: Multiple clustered punctate opacities overlie the expected location
of either renal fossa compatible with provided history of
nephrolithiasis. Rather extensive bilateral renal stone burden
appears similar to the [DATE] examination.

No definitive abnormal opacities overlies expected location of
either ureter or the urinary bladder.

Paucity of bowel gas no evidence of enteric obstruction. No
pneumoperitoneum, pneumatosis or portal venous gas.

Post cholecystectomy.

Post lower lumbar paraspinal fusion, incompletely evaluated.
IMPRESSION: Redemonstrated rather extensive bilateral nephrolithiasis with renal
stone burden appearing similar to the [DATE] examination. Further
evaluation with noncontrast CT scan of the abdomen and pelvis could
be performed as indicated.

## 2023-03-23 ENCOUNTER — Other Ambulatory Visit: Payer: Self-pay | Admitting: Urology

## 2023-03-23 DIAGNOSIS — N200 Calculus of kidney: Secondary | ICD-10-CM

## 2023-04-16 IMAGING — US US EXTREM LOW*L* LIMITED
1 series · 14 of 25 positions shown · non-contrast
Comparison: None.

CLINICAL DATA: Pain inguinal regions

EXAM:
ULTRASOUND both inguinal regions of LOWER EXTREMITY LIMITED
TECHNIQUE: Ultrasound examination of the lower extremity soft tissues was
performed in the area of clinical concern.

[Series 1: us extrem low*left* limited · 38 acquisitions, 14 frames shown]
[im 1/38]
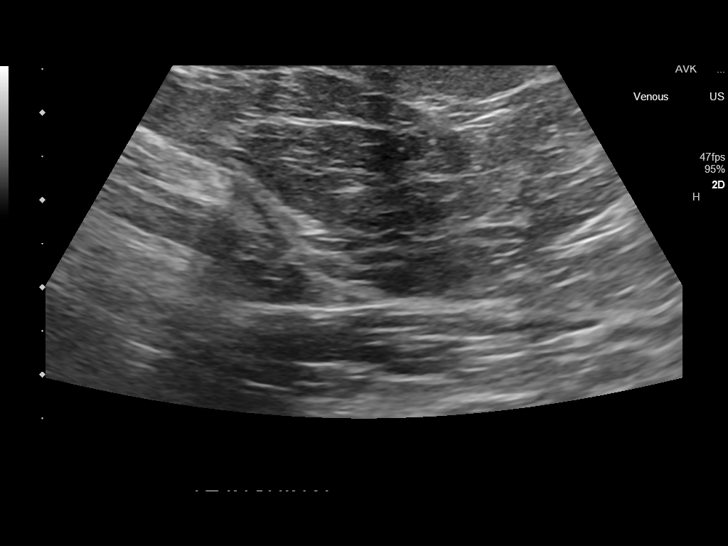
[im 4/38]
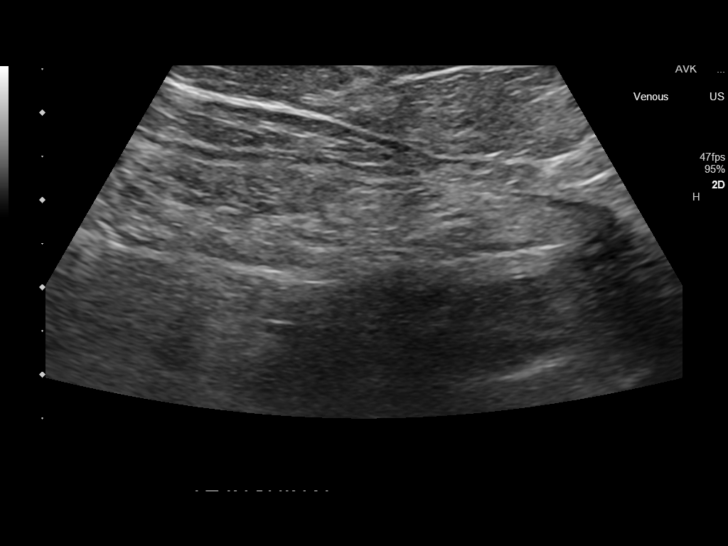
[im 7/38]
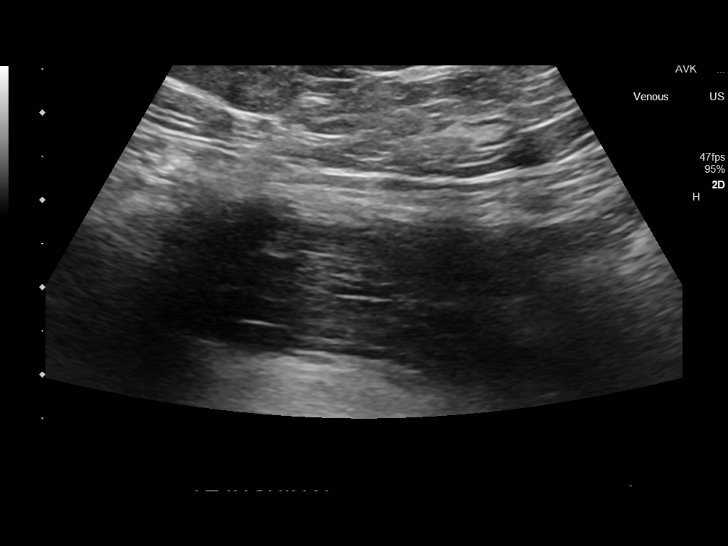
[im 10/38]
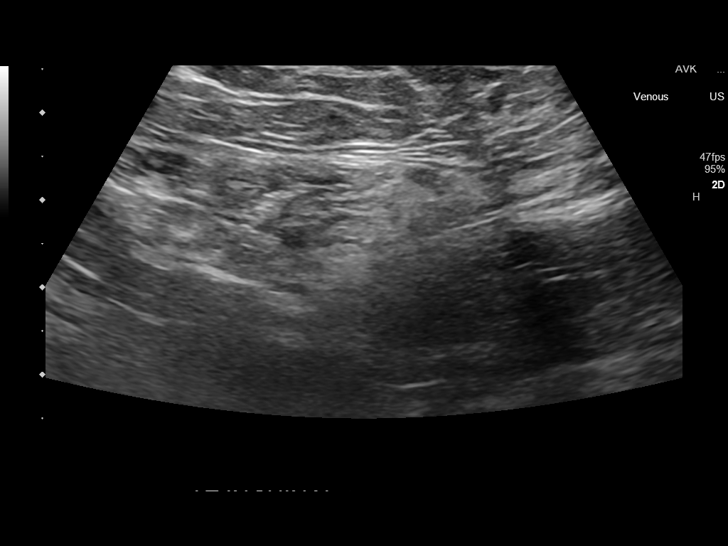
[im 13/38]
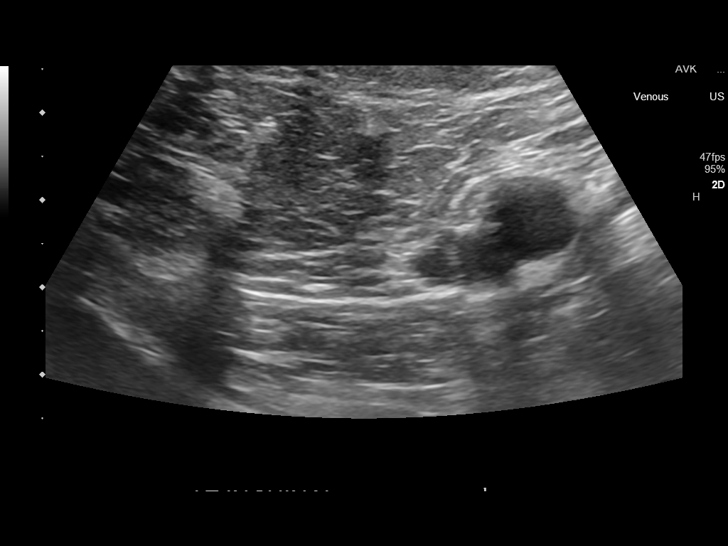
[im 14/38]
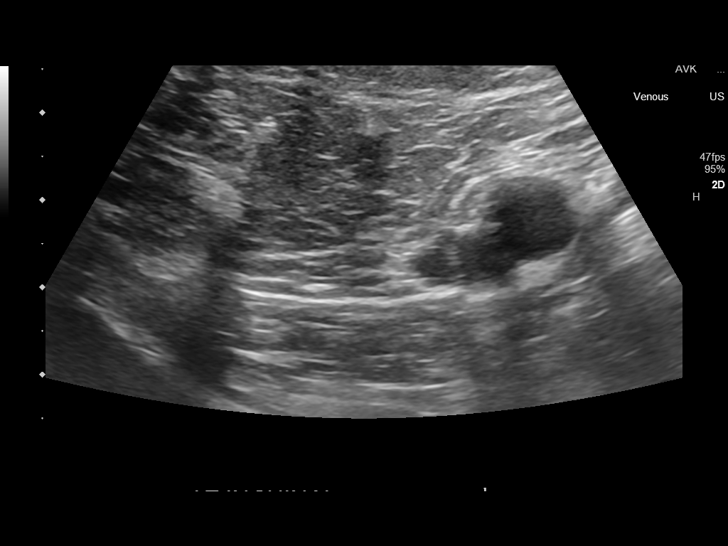
[im 17/38]
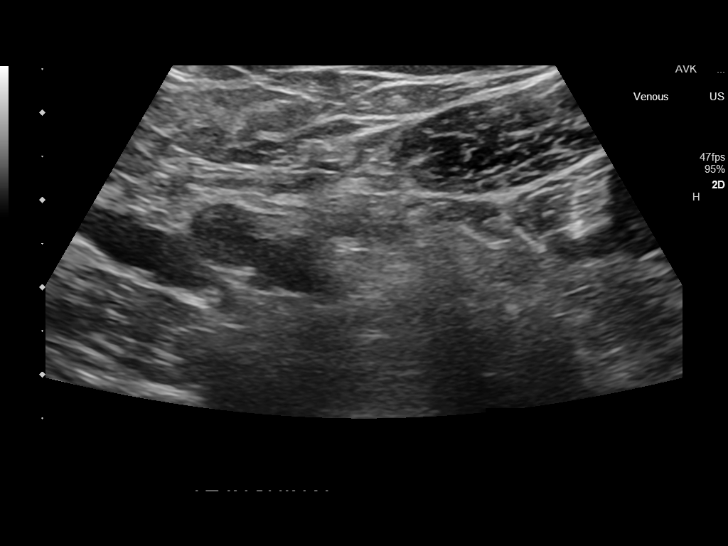
[im 21/38]
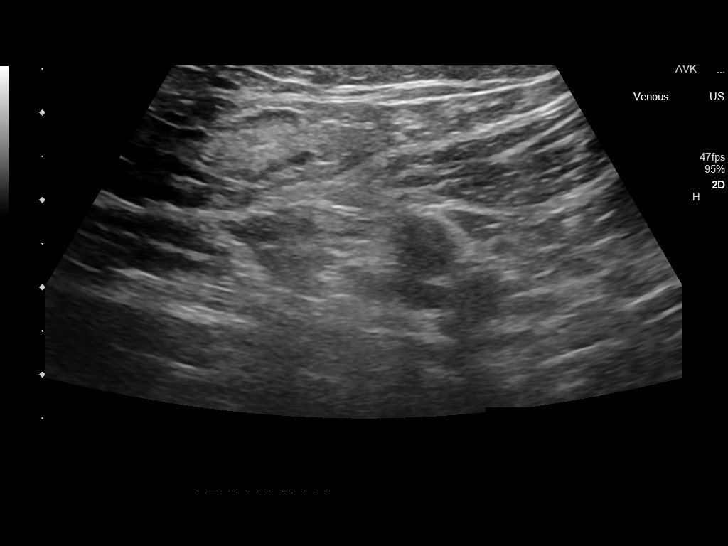
[im 24/38]
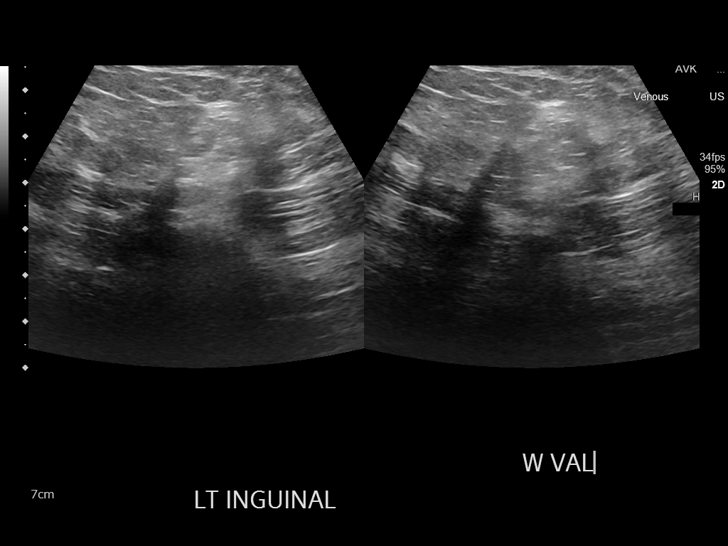
[im 25/38]
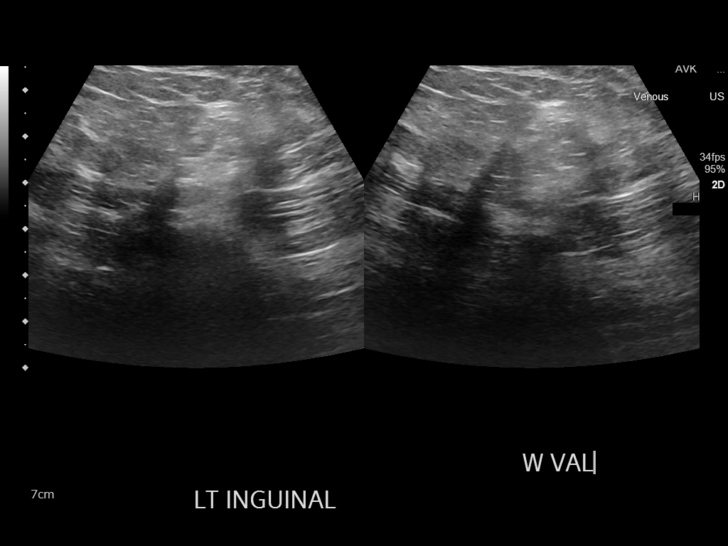
[im 28/38]
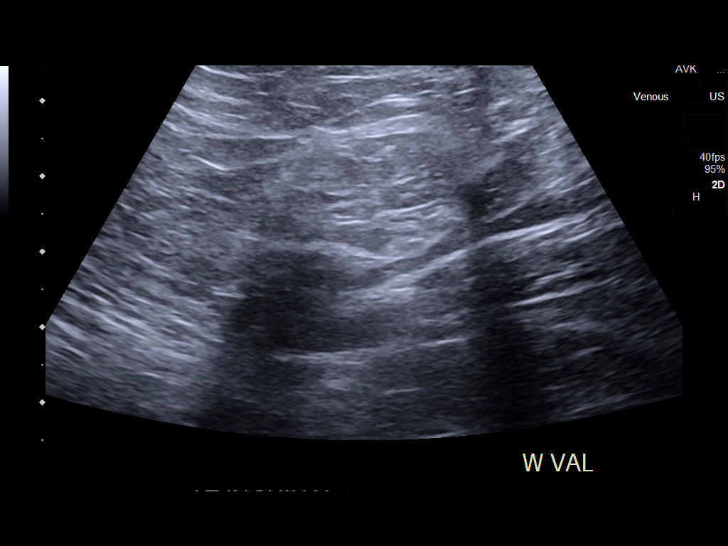
[im 31/38]
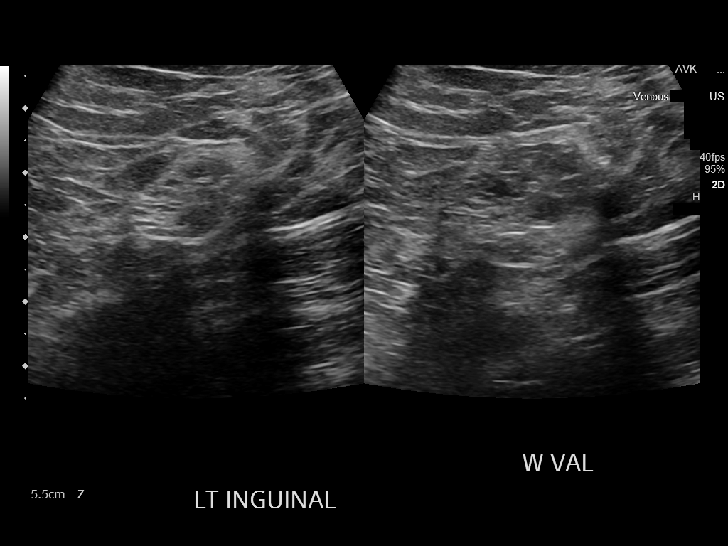
[im 34/38]
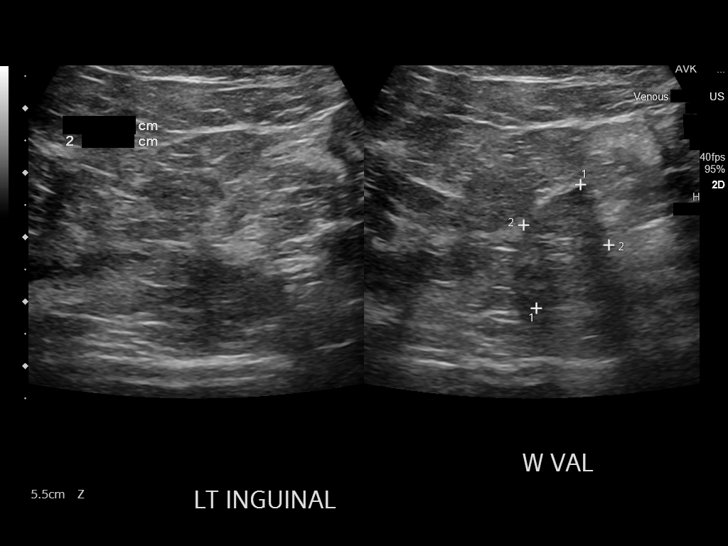
[im 38/38]
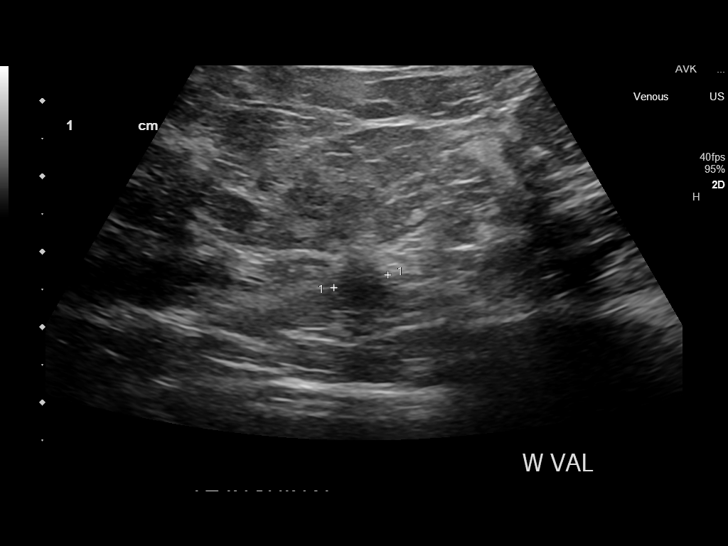

[14 of 25 positions shown; findings below may reference images not displayed]

FINDINGS: Soft Tissue Structures: Small left inguinal hernia containing fat is
seen. Hernial sac measures proximally 2 x 1.4 cm. There is no
demonstrable bowel loop in the left inguinal hernia. There is no
fluid collection in the left inguinal region.
IMPRESSION: Small left inguinal hernia containing fat.

## 2023-04-16 IMAGING — US US EXTREM LOW*R* LIMITED
1 series · 14 of 25 positions shown · non-contrast
Comparison: None.

CLINICAL DATA: Pain in the inguinal regions

EXAM:
ULTRASOUND right LOWER EXTREMITY LIMITED
TECHNIQUE: Ultrasound examination of the lower extremity soft tissues was
performed in the area of clinical concern.

[Series 1: us soft tissue lower extremity limited right (non- · 51 acquisitions, 14 frames shown]
[im 1/51]
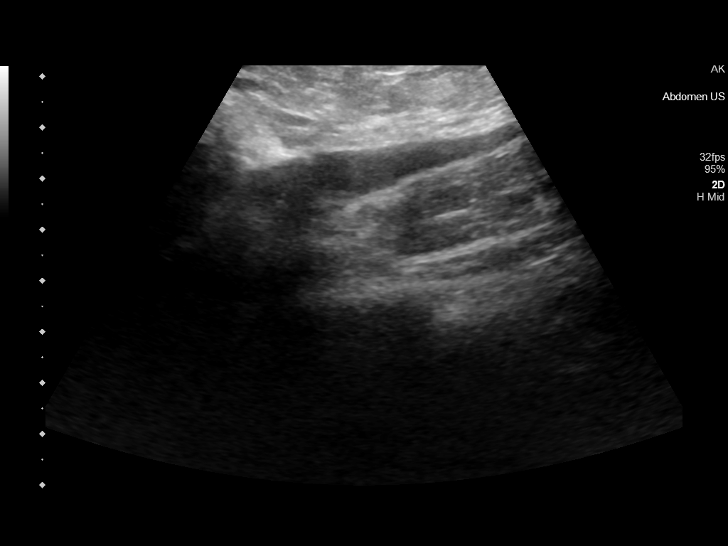
[im 5/51]
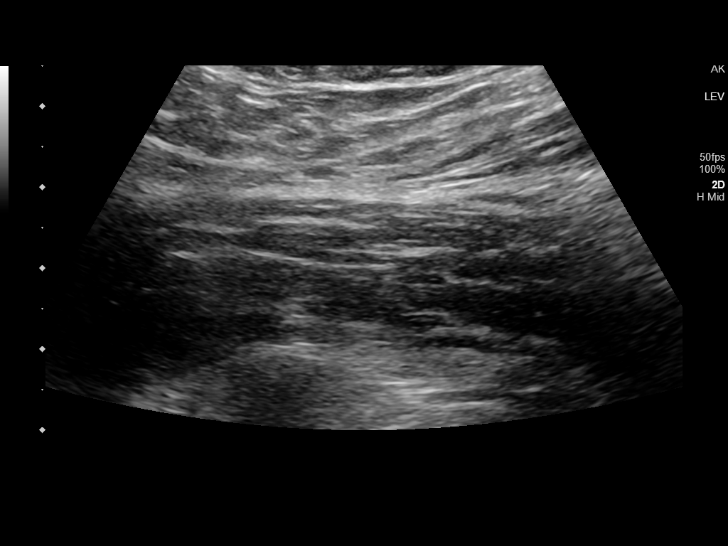
[im 9/51]
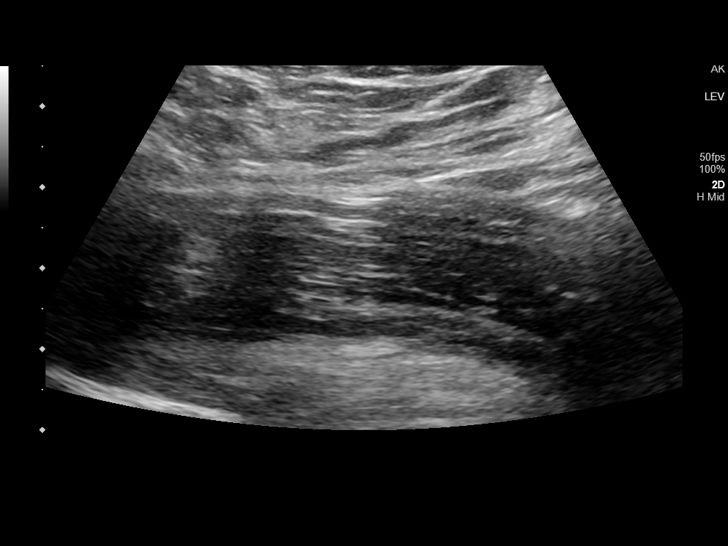
[im 13/51]
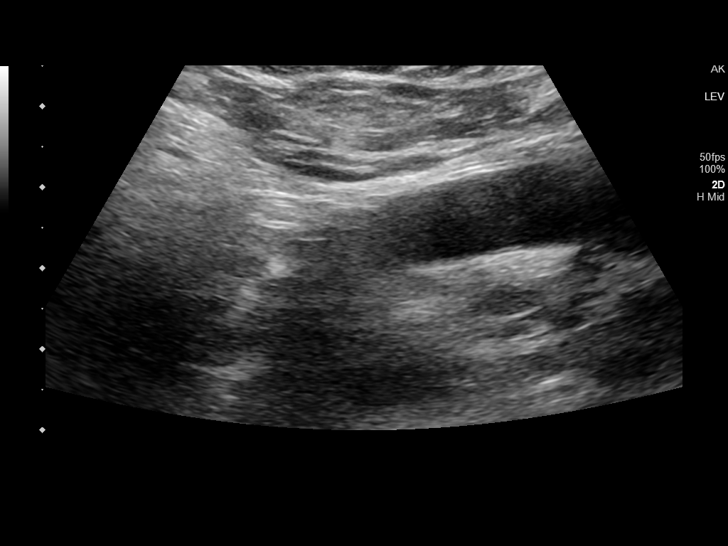
[im 17/51]
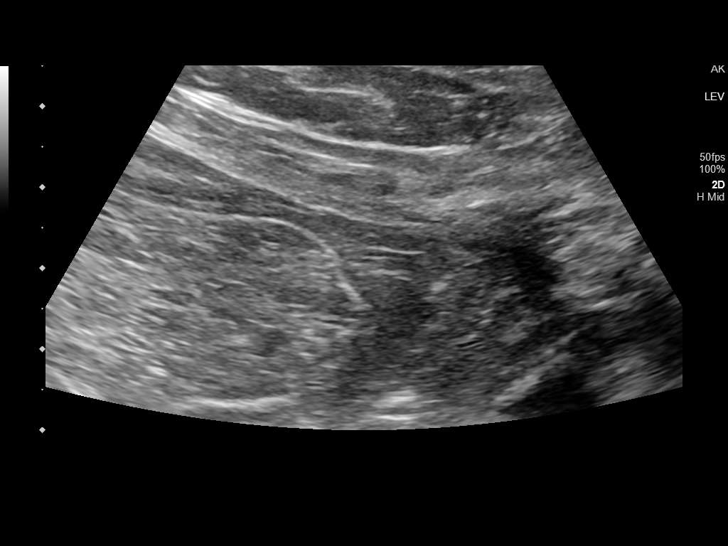
[im 19/51]
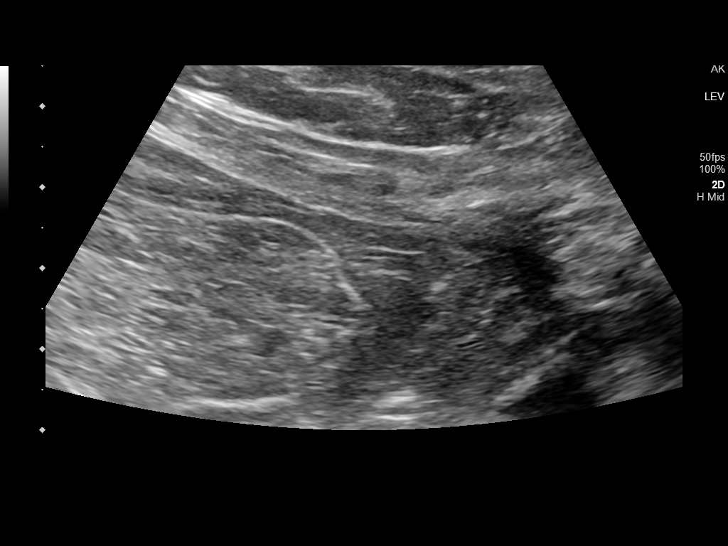
[im 23/51]
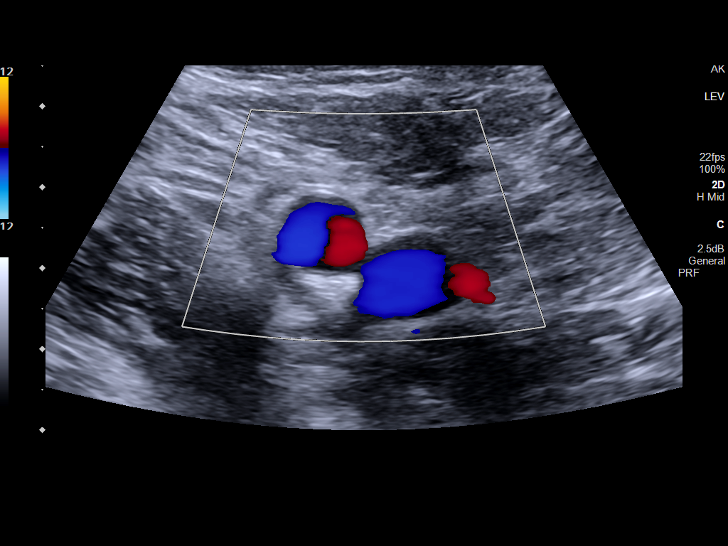
[im 28/51]
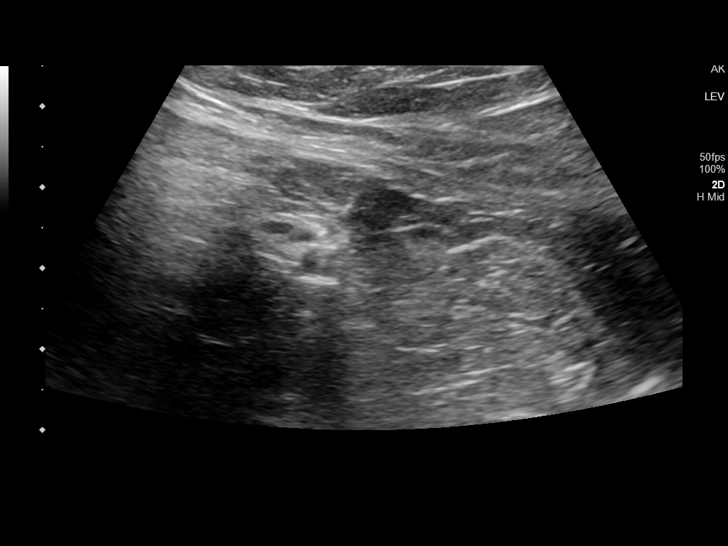
[im 32/51]
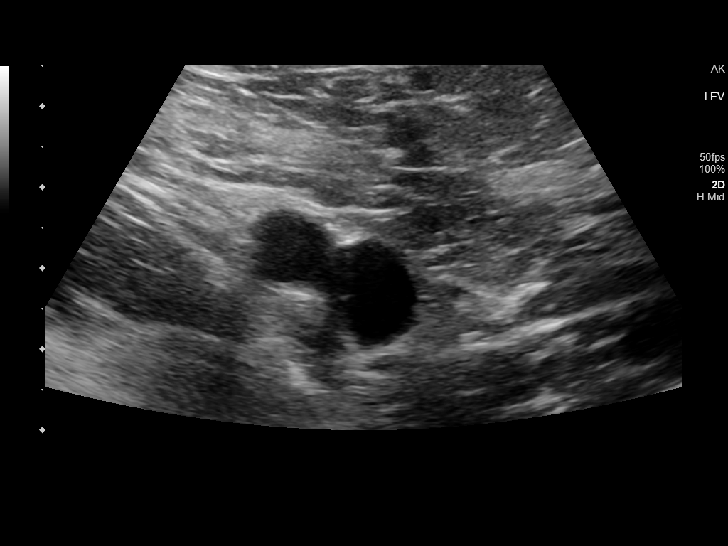
[im 34/51]
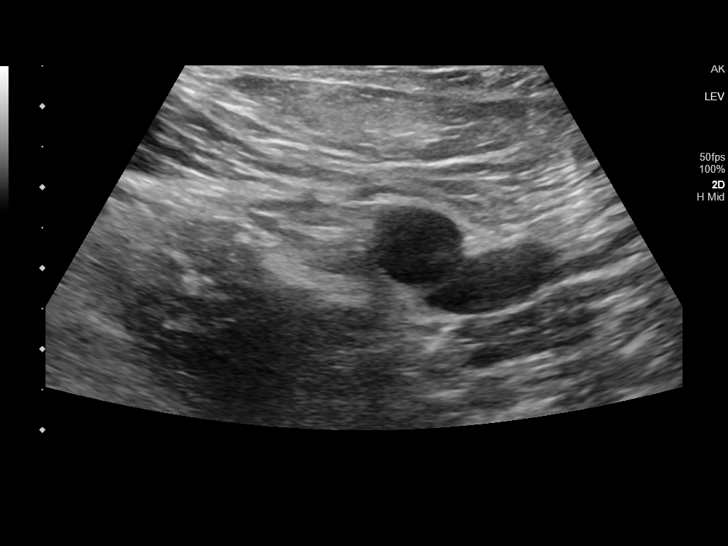
[im 38/51]
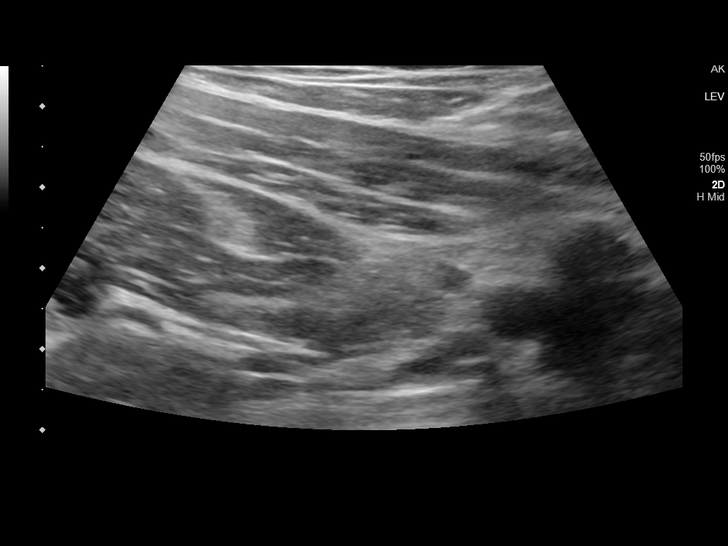
[im 42/51]
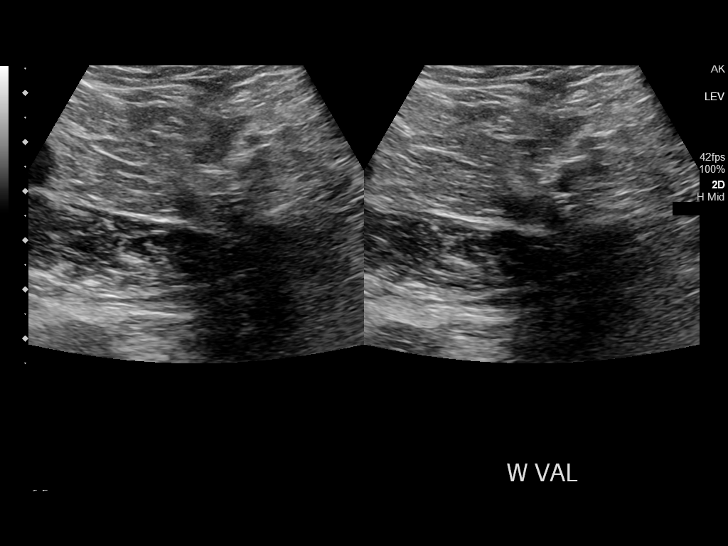
[im 46/51]
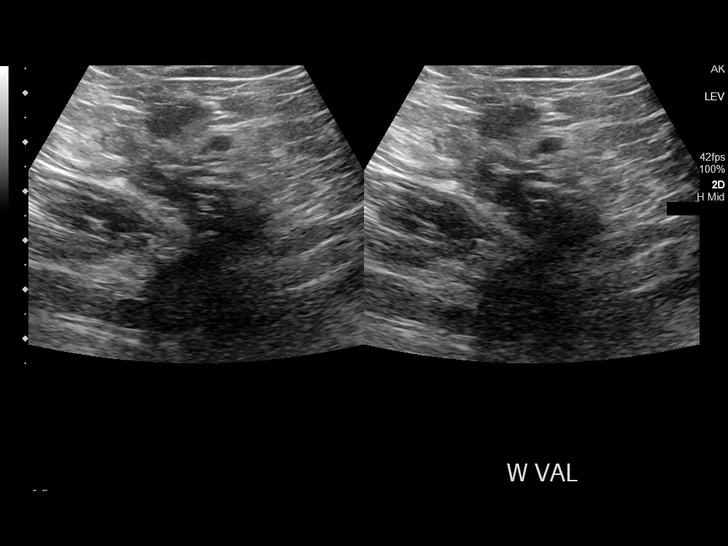
[im 51/51]
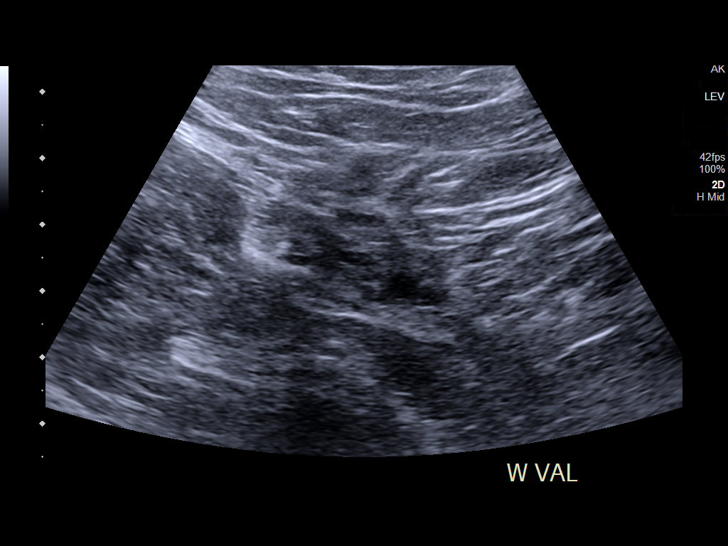

[14 of 25 positions shown; findings below may reference images not displayed]

FINDINGS: Other Soft Tissue Structures: There is no demonstrable right
inguinal hernia. There is no fluid collection.
IMPRESSION: No sonographic abnormality is seen in the right inguinal region.

## 2023-05-09 ENCOUNTER — Ambulatory Visit: Payer: Self-pay

## 2023-05-09 DIAGNOSIS — Z0000 Encounter for general adult medical examination without abnormal findings: Secondary | ICD-10-CM

## 2023-05-09 LAB — POCT URINALYSIS DIPSTICK
Bilirubin, UA: NEGATIVE
Blood, UA: NEGATIVE
Glucose, UA: NEGATIVE
Ketones, UA: NEGATIVE
Leukocytes, UA: NEGATIVE
Nitrite, UA: NEGATIVE
Protein, UA: NEGATIVE
Spec Grav, UA: 1.02 (ref 1.010–1.025)
Urobilinogen, UA: 0.2 E.U./dL
pH, UA: 6 (ref 5.0–8.0)

## 2023-05-09 NOTE — Progress Notes (Signed)
Fasting labs, urine and EKG completed pre physical for [facility].

## 2023-05-10 LAB — CMP12+LP+TP+TSH+6AC+PSA+CBC…
ALT: 28 [IU]/L (ref 0–44)
AST: 23 [IU]/L (ref 0–40)
Albumin: 4.7 g/dL (ref 3.9–4.9)
Alkaline Phosphatase: 91 [IU]/L (ref 44–121)
BUN/Creatinine Ratio: 9 — ABNORMAL LOW (ref 10–24)
BUN: 13 mg/dL (ref 8–27)
Basophils Absolute: 0 10*3/uL (ref 0.0–0.2)
Basos: 0 %
Bilirubin Total: 1.8 mg/dL — ABNORMAL HIGH (ref 0.0–1.2)
Calcium: 9.4 mg/dL (ref 8.6–10.2)
Chloride: 100 mmol/L (ref 96–106)
Chol/HDL Ratio: 3.8 ratio (ref 0.0–5.0)
Cholesterol, Total: 124 mg/dL (ref 100–199)
Creatinine, Ser: 1.38 mg/dL — ABNORMAL HIGH (ref 0.76–1.27)
EOS (ABSOLUTE): 0.2 10*3/uL (ref 0.0–0.4)
Eos: 2 %
Estimated CHD Risk: 0.7 times avg. (ref 0.0–1.0)
Free Thyroxine Index: 2.6 (ref 1.2–4.9)
GGT: 29 [IU]/L (ref 0–65)
Globulin, Total: 2.7 g/dL (ref 1.5–4.5)
Glucose: 100 mg/dL — ABNORMAL HIGH (ref 70–99)
HDL: 33 mg/dL — ABNORMAL LOW
Hematocrit: 52.5 % — ABNORMAL HIGH (ref 37.5–51.0)
Hemoglobin: 17.8 g/dL — ABNORMAL HIGH (ref 13.0–17.7)
Immature Grans (Abs): 0.1 10*3/uL (ref 0.0–0.1)
Immature Granulocytes: 1 %
Iron: 66 ug/dL (ref 38–169)
LDH: 182 [IU]/L (ref 121–224)
LDL Chol Calc (NIH): 55 mg/dL (ref 0–99)
Lymphocytes Absolute: 2.3 10*3/uL (ref 0.7–3.1)
Lymphs: 23 %
MCH: 29.5 pg (ref 26.6–33.0)
MCHC: 33.9 g/dL (ref 31.5–35.7)
MCV: 87 fL (ref 79–97)
Monocytes Absolute: 0.9 10*3/uL (ref 0.1–0.9)
Monocytes: 8 %
Neutrophils Absolute: 6.8 10*3/uL (ref 1.4–7.0)
Neutrophils: 66 %
Phosphorus: 3.6 mg/dL (ref 2.8–4.1)
Platelets: 291 10*3/uL (ref 150–450)
Potassium: 4.6 mmol/L (ref 3.5–5.2)
Prostate Specific Ag, Serum: 2.7 ng/mL (ref 0.0–4.0)
RBC: 6.03 x10E6/uL — ABNORMAL HIGH (ref 4.14–5.80)
RDW: 14.2 % (ref 11.6–15.4)
Sodium: 138 mmol/L (ref 134–144)
T3 Uptake Ratio: 28 % (ref 24–39)
T4, Total: 9.2 ug/dL (ref 4.5–12.0)
TSH: 3.27 u[IU]/mL (ref 0.450–4.500)
Total Protein: 7.4 g/dL (ref 6.0–8.5)
Triglycerides: 222 mg/dL — ABNORMAL HIGH (ref 0–149)
Uric Acid: 4.8 mg/dL (ref 3.8–8.4)
VLDL Cholesterol Cal: 36 mg/dL (ref 5–40)
WBC: 10.2 10*3/uL (ref 3.4–10.8)
eGFR: 58 mL/min/{1.73_m2} — ABNORMAL LOW

## 2023-05-11 ENCOUNTER — Encounter: Payer: Self-pay | Admitting: Physician Assistant

## 2023-05-11 ENCOUNTER — Ambulatory Visit: Payer: Self-pay | Admitting: Physician Assistant

## 2023-05-11 VITALS — BP 126/78 | HR 84 | Temp 97.3°F | Resp 12 | Ht 72.0 in | Wt 225.0 lb

## 2023-05-11 DIAGNOSIS — Z024 Encounter for examination for driving license: Secondary | ICD-10-CM

## 2023-05-11 DIAGNOSIS — Z0000 Encounter for general adult medical examination without abnormal findings: Secondary | ICD-10-CM

## 2023-05-11 MED ORDER — ROSUVASTATIN CALCIUM 40 MG PO TABS: 40.0000 mg | ORAL_TABLET | Freq: Every day | ORAL | 3 refills | Status: DC

## 2023-05-11 NOTE — Progress Notes (Signed)
[facility] ____________________________________________   None    (approximate)  I have reviewed the triage vital signs and the nursing notes.   HISTORY  Chief Complaint Commercial Driver's License Exam  HPI John [name] is a 62 y.o. male patient presents for DOT recertification exam.  Patient voiced no concerns or complaints.         Past Medical History:  Diagnosis Date   History of kidney stones    Hyperlipidemia    Hypertension     Patient Active Problem List   Diagnosis Date Noted   Benign prostatic hyperplasia without lower urinary tract symptoms 10/29/2018   Gilbert's syndrome 10/29/2018   Gout involving toe 10/29/2018   Displacement of lumbosacral intervertebral disc with myelopathy 01/01/2018   Lumbar degenerative disc disease 01/01/2018   Right sided sciatica 01/01/2018   Postlaminectomy syndrome of lumbar region 01/01/2018   BMI 30.0-30.9,adult 12/28/2017   Chronic kidney disease, stage 3 unspecified (HCC) 12/28/2017   Hyperlipidemia, mixed 12/28/2017   Essential hypertension 12/28/2017   Tobacco use 12/28/2017   Renal stone 10/07/2017   Cubital tunnel syndrome on right 12/12/2016   Degenerative tear of glenoid labrum of right shoulder 10/27/2016   Rotator cuff tendinitis, right 09/09/2016   Disorder of calcium metabolism 07/09/2012    Past Surgical History:  Procedure Laterality Date   BACK SURGERY     CHOLECYSTECTOMY     CYSTOSCOPY WITH LITHOLAPAXY N/A 12/18/2018   Procedure: CYSTOSCOPY WITH LITHOLAPAXY;  Surgeon: Riki Altes, MD;  Location: [facility];  Service: Urology;  Laterality: N/A;   CYSTOSCOPY/URETEROSCOPY/HOLMIUM LASER/STENT PLACEMENT Left 10/12/2017   Procedure: CYSTOSCOPY/URETEROSCOPY/HOLMIUM LASER/STENT PLACEMENT;  Surgeon: Riki Altes, MD;  Location: [facility];  Service: Urology;  Laterality: Left;   LUMBAR FUSION     L5S1   SHOULDER ARTHROSCOPY WITH SUBACROMIAL DECOMPRESSION Right  10/27/2016   Procedure: SHOULDER ARTHROSCOPY WITH SUBACROMIAL DECOMPRESSION  AND DEBRIDEMENT;  Surgeon: Christena Flake, MD;  Location: [facility];  Service: Orthopedics;  Laterality: Right;   SHOULDER SURGERY Right 2003    Prior to Admission medications   Medication Sig Start Date End Date Taking? Authorizing Provider  allopurinol (ZYLOPRIM) 100 MG tablet Take 2 tablets (200 mg total) by mouth at bedtime. 08/10/22  Yes Stoioff, Verna Czech, MD  losartan (COZAAR) 50 MG tablet Take 50 mg by mouth daily. 06/19/21  Yes [provider]  tamsulosin (FLOMAX) 0.4 MG CAPS capsule TAKE 1 CAPSULE(0.4 MG) BY MOUTH AT BEDTIME 03/24/23  Yes Stoioff, Verna Czech, MD  rosuvastatin (CRESTOR) 40 MG tablet Take 1 tablet (40 mg total) by mouth daily. 05/11/23   Joni Reining, PA-C    Allergies Patient has no known allergies.  Family History  Problem Relation Age of Onset   Heart disease Father    Prostate cancer Neg Hx    Bladder Cancer Neg Hx    Kidney cancer Neg Hx     Social History Social History   Tobacco Use   Smoking status: Never   Smokeless tobacco: Former    Types: Snuff   Tobacco comments:    Quit 08/2021  Vaping Use   Vaping status: Never Used  Substance Use Topics   Alcohol use: No   Drug use: No    Review of Systems Constitutional: No fever/chills Eyes: No visual changes. ENT: No sore throat. Cardiovascular: Denies chest pain. Respiratory: Denies shortness of breath. Gastrointestinal: No abdominal pain.  No nausea, no vomiting.  No diarrhea.  No constipation.  Genitourinary: Negative for dysuria. Musculoskeletal: Negative for back pain. Skin: Negative for rash. Neurological: Negative for headaches, focal weakness or numbness. Endocrine: Chronic kidney disease, gout, hyperlipidemia, and hypertension.  ____________________________________________   PHYSICAL EXAM:  VITAL SIGNS: BP 126/78  BP Location Left Arm  Patient Position Sitting  Cuff Size Large  Pulse 84  Resp 12   Temp 97.3 F (36.3 C)  Temp src Temporal  SpO2 96 %  Weight 225 lb (102.1 kg)  Height 6' (1.829 m)   BMI 30.52 kg/m2  BSA 2.28 m2   Constitutional: Alert and oriented. Well appearing and in no acute distress. Eyes: Conjunctivae are normal. PERRL. EOMI. Head: Atraumatic. Nose: No congestion/rhinnorhea. Mouth/Throat: Mucous membranes are moist.  Oropharynx non-erythematous. Neck: No stridor.  No cervical spine tenderness to palpation. Hematological/Lymphatic/Immunilogical: No cervical lymphadenopathy. Cardiovascular: Normal rate, regular rhythm. Grossly normal heart sounds.  Good peripheral circulation. Respiratory: Normal respiratory effort.  No retractions. Lungs CTAB. Gastrointestinal: Soft and nontender. No distention. No abdominal bruits. No CVA tenderness. Genitourinary: Deferred Musculoskeletal: No lower extremity tenderness nor edema.  No joint effusions. Neurologic:  Normal speech and language. No gross focal neurologic deficits are appreciated. No gait instability. Skin:  Skin is warm, dry and intact. No rash noted. Psychiatric: Mood and affect are normal. Speech and behavior are normal.  ____________________________________________   LABS           Component Ref Range & Units 2 d ago (05/09/23) 7 mo ago (09/16/22) 1 yr ago (07/30/21) 2 yr ago (11/30/20) 3 yr ago (07/31/19) 3 yr ago (07/18/19) 5 yr ago (10/11/17)  Color, UA yellow Dark Yellow Dark Yellow yellow Dk Yellow Yellow Yellow R  Clarity, UA clear Clear Clear cloudy Clear Clear   Glucose, UA Negative Negative Negative Negative Negative Negative Negative   Bilirubin, UA neg Negative Negative negative Negative Negative Negative R  Ketones, UA neg Negative Negative negative Negative Negative Negative R  Spec Grav, UA 1.010 - 1.025 1.020 >=1.030 Abnormal  1.025 >=1.030 Abnormal  1.025 >=1.030 Abnormal  1.020 R  Blood, UA neg Positive CM Positive CM +- CM Positive CM Positive CM 2+ Abnormal  R  pH, UA 5.0 -  8.0 6.0 5.5 6.0 5.5 6.0 6.0 5.5 R  Protein, UA Negative Negative Negative Negative Negative Negative Negative Negative R  Urobilinogen, UA 0.2 or 1.0 E.U./dL 0.2 0.2 0.2 0.2 0.2 0.2   Nitrite, UA neg Negative Negative negative Negative Negative Negative R  Leukocytes, UA Negative Negative Negative Negative Negative Negative Negative Negative  Appearance dark        Odor none        Resulting Agency       [facility]         Specimen Collected: 05/09/23 09:30 Last Resulted: 05/09/23 09:30      Lab Flowsheet      Order Details      View Encounter      Lab and Collection Details      Routing      Result History    View All Conversations on this Encounter      CM=Additional comments  R=Reference range differs from displayed range      Result Care Coordination   Patient Communication   Add Comments   Seen Back to Top    Other Results from 05/09/2023   Contains abnormal data Male Executive Panel Order: [id number] Status: Final result      Visible to patient: Yes (not seen)  Next appt: 08/11/2023 at 10:15 AM in Urology Riki Altes, MD)      Dx: Routine adult health maintenance    0 Result Notes             Component Ref Range & Units 2 d ago (05/09/23) 7 mo ago (09/16/22) 1 yr ago (01/20/22) 1 yr ago (07/30/21) 2 yr ago (11/30/20) 3 yr ago (07/18/19) 5 yr ago (02/24/18) 5 yr ago (02/24/18)  Glucose 70 - 99 mg/dL 161 High  93  99 96 R 096 High  R 128 High  R   Uric Acid 3.8 - 8.4 mg/dL 4.8 4.9 CM  4.5 CM 4.9 CM 5.6 R, CM    Comment:            Therapeutic target for gout patients: <6.0  BUN 8 - 27 mg/dL 13 14  11 16 12  R 15 R   Creatinine, Ser 0.76 - 1.27 mg/dL 0.45 High  4.09 High   1.36 High  1.36 High  1.11 1.97 High  R   eGFR >59 mL/min/1.73 58 Low  56 Low   60 60 CM     BUN/Creatinine Ratio 10 - 24 9 Low  10  8 Low  12 11 R    Sodium 134 - 144 mmol/L 138 142  138 142 139 138 R   Potassium 3.5 - 5.2 mmol/L 4.6 4.2  4.7 4.7 4.7 4.2 R    Chloride 96 - 106 mmol/L 100 104  101 103 103 106 R   Calcium 8.6 - 10.2 mg/dL 9.4 9.5  9.4 9.9 9.5 R 8.8 Low  R   Phosphorus 2.8 - 4.1 mg/dL 3.6 3.9  3.2 4.2 High  3.1    Total Protein 6.0 - 8.5 g/dL 7.4 7.0  6.9 7.1 6.9 7.4 R   Albumin 3.9 - 4.9 g/dL 4.7 4.7  4.5 R 4.7 R 4.5 R 4.1 R   Globulin, Total 1.5 - 4.5 g/dL 2.7 2.3  2.4 2.4 2.4    Bilirubin Total 0.0 - 1.2 mg/dL 1.8 High  1.8 High   2.3 High  1.5 High  1.6 High  2.0 High  R   Alkaline Phosphatase 44 - 121 IU/L 91 78  90 91 94 R 69 R   LDH 121 - 224 IU/L 182 143  203 163 174    AST 0 - 40 IU/L 23 20  29 24 29 29  R   ALT 0 - 44 IU/L 28 29  39 39 51 High  29 R   GGT 0 - 65 IU/L 29 31  32 36 42    Iron 38 - 169 ug/dL 66 70  66 72 84    Cholesterol, Total 100 - 199 mg/dL 811 914  782 956 213    Triglycerides 0 - 149 mg/dL 086 High  578 High   469 High  159 High  160 High     HDL >39 mg/dL 33 Low  40  35 Low  38 Low  33 Low     VLDL Cholesterol Cal 5 - 40 mg/dL 36 27  29 28 28     LDL Chol Calc (NIH) 0 - 99 mg/dL 55 56  73 79 77    Chol/HDL Ratio 0.0 - 5.0 ratio 3.8 3.1 CM  3.9 CM 3.8 CM 4.2 CM    Comment:  T. Chol/HDL Ratio                                             Men  Women                               1/2 Avg.Risk  3.4    3.3                                   Avg.Risk  5.0    4.4                                2X Avg.Risk  9.6    7.1                                3X Avg.Risk 23.4   11.0  Estimated CHD Risk 0.0 - 1.0 times avg. 0.7  < 0.5 CM  0.7 CM 0.7 CM 0.8 CM    Comment: The CHD Risk is based on the T. Chol/HDL ratio. Other factors affect CHD Risk such as hypertension, smoking, diabetes, severe obesity, and family history of premature CHD.  TSH 0.450 - 4.500 uIU/mL 3.270 3.030  1.720 2.830 1.990    T4, Total 4.5 - 12.0 ug/dL 9.2 8.1  7.8 7.1 8.3    T3 Uptake Ratio 24 - 39 % 28 29  32 29 38    Free Thyroxine Index 1.2 - 4.9 2.6 2.3  2.5 2.1 3.2    Prostate  Specific Ag, Serum 0.0 - 4.0 ng/mL 2.7 4.7 High  CM 2.3 CM 3.5 CM 2.3 CM 1.5 CM    Comment: Roche ECLIA methodology. According to the American Urological Association, Serum PSA should decrease and remain at undetectable levels after radical prostatectomy. The AUA defines biochemical recurrence as an initial PSA value 0.2 ng/mL or greater followed by a subsequent confirmatory PSA value 0.2 ng/mL or greater. Values obtained with different assay methods or kits cannot be used interchangeably. Results cannot be interpreted as absolute evidence of the presence or absence of malignant disease.  WBC 3.4 - 10.8 x10E3/uL 10.2 8.8  10.5 9.7 8.7  16.3 High  R  RBC 4.14 - 5.80 x10E6/uL 6.03 High  5.70  5.84 High  6.20 High  5.87 High   5.48 R  Hemoglobin 13.0 - 17.7 g/dL 40.9 High  81.1  91.4 78.2 High  16.9  16.2 R  Hematocrit 37.5 - 51.0 % 52.5 High  51.3 High   51.3 High  55.1 High  50.7  48.0 R  MCV 79 - 97 fL 87 90  88 89 86  87.5 R  MCH 26.6 - 33.0 pg 29.5 29.8  29.8 29.5 28.8  29.6 R  MCHC 31.5 - 35.7 g/dL 95.6 21.3  08.6 57.8 46.9  33.9 R  RDW 11.6 - 15.4 % 14.2 14.1  14.1 14.4 15.1  14.8 High  R  Platelets 150 - 450 x10E3/uL 291 258  216 314 295  254 R  Neutrophils Not Estab. % 66 62  74 63 68  84 R  Lymphs Not Estab. % 23 26  15  26 19    Monocytes Not Estab. % 8 9  7 7 9     Eos Not Estab. % 2 2  2 2 2     Basos Not Estab. % 0 1  1 1 1     Neutrophils Absolute 1.4 - 7.0 x10E3/uL 6.8 5.5  7.9 High  6.1 6.0  13.6 High  R  Lymphocytes Absolute 0.7 - 3.1 x10E3/uL 2.3 2.2  1.6 2.5 1.6  1.2 R  Monocytes Absolute 0.1 - 0.9 x10E3/uL 0.9 0.8  0.8 0.7 0.8    EOS (ABSOLUTE) 0.0 - 0.4 x10E3/uL 0.2 0.2  0.2 0.2 0.2    Basophils Absolute 0.0 - 0.2 x10E3/uL 0.0 0.1  0.1 0.1 0.0  0.0 R, CM  Immature Granulocytes Not Estab. % 1 0  1 1 1     Immature Grans (Abs) 0.0 - 0.1                ____________________________________________  EKG  Sinus bradycardia at 59  bpm ____________________________________________    ____________________________________________   INITIAL IMPRESSION / ASSESSMENT AND PLAN  As part of my medical decision making, I reviewed the following data within the electronic MEDICAL RECORD NUMBER      No acute findings on physical exam.  Patient triglycerides has increased since last exam.  Will increase Crestor from 20 to 40 mg.        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  ED Discharge Orders     None        Note:  This document was prepared using Dragon voice recognition software and may include unintentional dictation errors.

## 2023-05-11 NOTE — Progress Notes (Signed)
[facility] ____________________________________________   None    (approximate)  I have reviewed the triage vital signs and the nursing notes.   HISTORY  Chief Complaint Annual Exam    HPI John [name] is a 62 y.o. male patient presents for annual physical exam.  Patient with no complaints or concerns.         Past Medical History:  Diagnosis Date   History of kidney stones    Hyperlipidemia    Hypertension     Patient Active Problem List   Diagnosis Date Noted   Benign prostatic hyperplasia without lower urinary tract symptoms 10/29/2018   Gilbert's syndrome 10/29/2018   Gout involving toe 10/29/2018   Displacement of lumbosacral intervertebral disc with myelopathy 01/01/2018   Lumbar degenerative disc disease 01/01/2018   Right sided sciatica 01/01/2018   Postlaminectomy syndrome of lumbar region 01/01/2018   BMI 30.0-30.9,adult 12/28/2017   Chronic kidney disease, stage 3 unspecified (HCC) 12/28/2017   Hyperlipidemia, mixed 12/28/2017   Essential hypertension 12/28/2017   Tobacco use 12/28/2017   Renal stone 10/07/2017   Cubital tunnel syndrome on right 12/12/2016   Degenerative tear of glenoid labrum of right shoulder 10/27/2016   Rotator cuff tendinitis, right 09/09/2016   Disorder of calcium metabolism 07/09/2012    Past Surgical History:  Procedure Laterality Date   BACK SURGERY     CHOLECYSTECTOMY     CYSTOSCOPY WITH LITHOLAPAXY N/A 12/18/2018   Procedure: CYSTOSCOPY WITH LITHOLAPAXY;  Surgeon: Riki Altes, MD;  Location: [facility];  Service: Urology;  Laterality: N/A;   CYSTOSCOPY/URETEROSCOPY/HOLMIUM LASER/STENT PLACEMENT Left 10/12/2017   Procedure: CYSTOSCOPY/URETEROSCOPY/HOLMIUM LASER/STENT PLACEMENT;  Surgeon: Riki Altes, MD;  Location: [facility];  Service: Urology;  Laterality: Left;   LUMBAR FUSION     L5S1   SHOULDER ARTHROSCOPY WITH SUBACROMIAL DECOMPRESSION Right 10/27/2016   Procedure:  SHOULDER ARTHROSCOPY WITH SUBACROMIAL DECOMPRESSION  AND DEBRIDEMENT;  Surgeon: Christena Flake, MD;  Location: [facility];  Service: Orthopedics;  Laterality: Right;   SHOULDER SURGERY Right 2003    Prior to Admission medications   Medication Sig Start Date End Date Taking? Authorizing Provider  allopurinol (ZYLOPRIM) 100 MG tablet Take 2 tablets (200 mg total) by mouth at bedtime. 08/10/22  Yes Stoioff, Verna Czech, MD  losartan (COZAAR) 50 MG tablet Take 50 mg by mouth daily. 06/19/21  Yes [provider]  rosuvastatin (CRESTOR) 40 MG tablet Take 1 tablet (40 mg total) by mouth daily. 05/11/23  Yes Joni Reining, PA-C  tamsulosin (FLOMAX) 0.4 MG CAPS capsule TAKE 1 CAPSULE(0.4 MG) BY MOUTH AT BEDTIME 03/24/23  Yes Stoioff, Verna Czech, MD    Allergies Patient has no known allergies.  Family History  Problem Relation Age of Onset   Heart disease Father    Prostate cancer Neg Hx    Bladder Cancer Neg Hx    Kidney cancer Neg Hx     Social History Social History   Tobacco Use   Smoking status: Never   Smokeless tobacco: Former    Types: Snuff   Tobacco comments:    Quit 08/2021  Vaping Use   Vaping status: Never Used  Substance Use Topics   Alcohol use: No   Drug use: No    Review of Systems Constitutional: No fever/chills Eyes: No visual changes. ENT: No sore throat. Cardiovascular: Denies chest pain. Respiratory: Denies shortness of breath. Gastrointestinal: No abdominal pain.  No nausea, no vomiting.  No diarrhea.  No constipation.  Genitourinary: Negative for dysuria. Musculoskeletal: Negative for back pain. Skin: Negative for rash. Neurological: Negative for headaches, focal weakness or numbness. Endocrine: Chronic kidney disease and BPH.  Gout hyperlipidemia and hypertension.  ____________________________________________   PHYSICAL EXAM:  VITAL SIGNS: BP 126/78  BP Location Left Arm  Patient Position Sitting  Cuff Size Large  Pulse 84  Resp 12  Temp 97.3  F (36.3 C)  Temp src Temporal  SpO2 96 %  Weight 225 lb (102.1 kg)  Height 6' (1.829 m)   BMI 30.52 kg/m2  BSA 2.28 m2   Constitutional: Alert and oriented. Well appearing and in no acute distress. Eyes: Conjunctivae are normal. PERRL. EOMI. Head: Atraumatic. Nose: No congestion/rhinnorhea. Mouth/Throat: Mucous membranes are moist.  Oropharynx non-erythematous. Neck: No stridor.  No cervical spine tenderness to palpation. Hematological/Lymphatic/Immunilogical: No cervical lymphadenopathy. Cardiovascular: Normal rate, regular rhythm. Grossly normal heart sounds.  Good peripheral circulation. Respiratory: Normal respiratory effort.  No retractions. Lungs CTAB. Gastrointestinal: Soft and nontender. No distention. No abdominal bruits. No CVA tenderness. Genitourinary: Deferred Musculoskeletal: No lower extremity tenderness nor edema.  No joint effusions. Neurologic:  Normal speech and language. No gross focal neurologic deficits are appreciated. No gait instability. Skin:  Skin is warm, dry and intact. No rash noted. Psychiatric: Mood and affect are normal. Speech and behavior are normal.  ____________________________________________   LABS           Component Ref Range & Units 2 d ago (05/09/23) 7 mo ago (09/16/22) 1 yr ago (07/30/21) 2 yr ago (11/30/20) 3 yr ago (07/31/19) 3 yr ago (07/18/19) 5 yr ago (10/11/17)  Color, UA yellow Dark Yellow Dark Yellow yellow Dk Yellow Yellow Yellow R  Clarity, UA clear Clear Clear cloudy Clear Clear   Glucose, UA Negative Negative Negative Negative Negative Negative Negative   Bilirubin, UA neg Negative Negative negative Negative Negative Negative R  Ketones, UA neg Negative Negative negative Negative Negative Negative R  Spec Grav, UA 1.010 - 1.025 1.020 >=1.030 Abnormal  1.025 >=1.030 Abnormal  1.025 >=1.030 Abnormal  1.020 R  Blood, UA neg Positive CM Positive CM +- CM Positive CM Positive CM 2+ Abnormal  R  pH, UA 5.0 - 8.0 6.0 5.5  6.0 5.5 6.0 6.0 5.5 R  Protein, UA Negative Negative Negative Negative Negative Negative Negative Negative R  Urobilinogen, UA 0.2 or 1.0 E.U./dL 0.2 0.2 0.2 0.2 0.2 0.2   Nitrite, UA neg Negative Negative negative Negative Negative Negative R  Leukocytes, UA Negative Negative Negative Negative Negative Negative Negative Negative  Appearance dark        Odor none        Resulting Agency       [facility]         Specimen Collected: 05/09/23 09:30 Last Resulted: 05/09/23 09:30      Lab Flowsheet      Order Details      View Encounter      Lab and Collection Details      Routing      Result History    View All Conversations on this Encounter      CM=Additional comments  R=Reference range differs from displayed range      Result Care Coordination   Patient Communication   Add Comments   Seen Back to Top    Other Results from 05/09/2023   Contains abnormal data Male Executive Panel Order: [id number] Status: Final result      Visible to patient: Yes (not seen)  Next appt: 08/11/2023 at 10:15 AM in Urology Riki Altes, MD)      Dx: Routine adult health maintenance    0 Result Notes             Component Ref Range & Units 2 d ago (05/09/23) 7 mo ago (09/16/22) 1 yr ago (01/20/22) 1 yr ago (07/30/21) 2 yr ago (11/30/20) 3 yr ago (07/18/19) 5 yr ago (02/24/18) 5 yr ago (02/24/18)  Glucose 70 - 99 mg/dL 546 High  93  99 96 R 270 High  R 128 High  R   Uric Acid 3.8 - 8.4 mg/dL 4.8 4.9 CM  4.5 CM 4.9 CM 5.6 R, CM    Comment:            Therapeutic target for gout patients: <6.0  BUN 8 - 27 mg/dL 13 14  11 16 12  R 15 R   Creatinine, Ser 0.76 - 1.27 mg/dL 3.50 High  0.93 High   1.36 High  1.36 High  1.11 1.97 High  R   eGFR >59 mL/min/1.73 58 Low  56 Low   60 60 CM     BUN/Creatinine Ratio 10 - 24 9 Low  10  8 Low  12 11 R    Sodium 134 - 144 mmol/L 138 142  138 142 139 138 R   Potassium 3.5 - 5.2 mmol/L 4.6 4.2  4.7 4.7 4.7 4.2 R   Chloride 96 -  106 mmol/L 100 104  101 103 103 106 R   Calcium 8.6 - 10.2 mg/dL 9.4 9.5  9.4 9.9 9.5 R 8.8 Low  R   Phosphorus 2.8 - 4.1 mg/dL 3.6 3.9  3.2 4.2 High  3.1    Total Protein 6.0 - 8.5 g/dL 7.4 7.0  6.9 7.1 6.9 7.4 R   Albumin 3.9 - 4.9 g/dL 4.7 4.7  4.5 R 4.7 R 4.5 R 4.1 R   Globulin, Total 1.5 - 4.5 g/dL 2.7 2.3  2.4 2.4 2.4    Bilirubin Total 0.0 - 1.2 mg/dL 1.8 High  1.8 High   2.3 High  1.5 High  1.6 High  2.0 High  R   Alkaline Phosphatase 44 - 121 IU/L 91 78  90 91 94 R 69 R   LDH 121 - 224 IU/L 182 143  203 163 174    AST 0 - 40 IU/L 23 20  29 24 29 29  R   ALT 0 - 44 IU/L 28 29  39 39 51 High  29 R   GGT 0 - 65 IU/L 29 31  32 36 42    Iron 38 - 169 ug/dL 66 70  66 72 84    Cholesterol, Total 100 - 199 mg/dL 818 299  371 696 789    Triglycerides 0 - 149 mg/dL 381 High  017 High   510 High  159 High  160 High     HDL >39 mg/dL 33 Low  40  35 Low  38 Low  33 Low     VLDL Cholesterol Cal 5 - 40 mg/dL 36 27  29 28 28     LDL Chol Calc (NIH) 0 - 99 mg/dL 55 56  73 79 77    Chol/HDL Ratio 0.0 - 5.0 ratio 3.8 3.1 CM  3.9 CM 3.8 CM 4.2 CM    Comment:  T. Chol/HDL Ratio                                             Men  Women                               1/2 Avg.Risk  3.4    3.3                                   Avg.Risk  5.0    4.4                                2X Avg.Risk  9.6    7.1                                3X Avg.Risk 23.4   11.0  Estimated CHD Risk 0.0 - 1.0 times avg. 0.7  < 0.5 CM  0.7 CM 0.7 CM 0.8 CM    Comment: The CHD Risk is based on the T. Chol/HDL ratio. Other factors affect CHD Risk such as hypertension, smoking, diabetes, severe obesity, and family history of premature CHD.  TSH 0.450 - 4.500 uIU/mL 3.270 3.030  1.720 2.830 1.990    T4, Total 4.5 - 12.0 ug/dL 9.2 8.1  7.8 7.1 8.3    T3 Uptake Ratio 24 - 39 % 28 29  32 29 38    Free Thyroxine Index 1.2 - 4.9 2.6 2.3  2.5 2.1 3.2    Prostate Specific Ag,  Serum 0.0 - 4.0 ng/mL 2.7 4.7 High  CM 2.3 CM 3.5 CM 2.3 CM 1.5 CM    Comment: Roche ECLIA methodology. According to the American Urological Association, Serum PSA should decrease and remain at undetectable levels after radical prostatectomy. The AUA defines biochemical recurrence as an initial PSA value 0.2 ng/mL or greater followed by a subsequent confirmatory PSA value 0.2 ng/mL or greater. Values obtained with different assay methods or kits cannot be used interchangeably. Results cannot be interpreted as absolute evidence of the presence or absence of malignant disease.  WBC 3.4 - 10.8 x10E3/uL 10.2 8.8  10.5 9.7 8.7  16.3 High  R  RBC 4.14 - 5.80 x10E6/uL 6.03 High  5.70  5.84 High  6.20 High  5.87 High   5.48 R  Hemoglobin 13.0 - 17.7 g/dL 16.1 High  09.6  04.5 40.9 High  16.9  16.2 R  Hematocrit 37.5 - 51.0 % 52.5 High  51.3 High   51.3 High  55.1 High  50.7  48.0 R  MCV 79 - 97 fL 87 90  88 89 86  87.5 R  MCH 26.6 - 33.0 pg 29.5 29.8  29.8 29.5 28.8  29.6 R  MCHC 31.5 - 35.7 g/dL 81.1 91.4  78.2 95.6 21.3  33.9 R  RDW 11.6 - 15.4 % 14.2 14.1  14.1 14.4 15.1  14.8 High  R  Platelets 150 - 450 x10E3/uL 291 258  216 314 295  254 R  Neutrophils Not Estab. % 66 62  74 63 68  84 R  Lymphs Not Estab. % 23 26  15  26 19    Monocytes Not Estab. % 8 9  7 7 9     Eos Not Estab. % 2 2  2 2 2     Basos Not Estab. % 0 1  1 1 1     Neutrophils Absolute 1.4 - 7.0 x10E3/uL 6.8 5.5  7.9 High  6.1 6.0  13.6 High  R  Lymphocytes Absolute 0.7 - 3.1 x10E3/uL 2.3 2.2  1.6 2.5 1.6  1.2 R  Monocytes Absolute 0.1 - 0.9 x10E3/uL 0.9 0.8  0.8 0.7 0.8    EOS (ABSOLUTE) 0.0 - 0.4 x10E3/uL 0.2 0.2  0.2 0.2 0.2    Basophils Absolute 0.0 - 0.2 x10E3/uL 0.0 0.1  0.1 0.1 0.0  0.0 R, CM  Immature Granulocytes Not Estab. % 1 0  1 1 1     Immature Grans (Abs) 0.0 - 0.1               ____________________________________________  EKG  Sinus bradycardia 59  bpm ____________________________________________    ____________________________________________   INITIAL IMPRESSION / ASSESSMENT AND PLAN As part of my medical decision making, I reviewed the following data within the electronic MEDICAL RECORD NUMBER      No acute findings on physical exam and EKG.  Patient triglycerides continue to be elevated.  Will increase Crestor from 20 mg to 40 mg and follow-up in 3 months.        ____________________________________________   FINAL CLINICAL IMPRESSION Well exam   ED Discharge Orders          Ordered    rosuvastatin (CRESTOR) 40 MG tablet  Daily        05/11/23 0918             Note:  This document was prepared using Dragon voice recognition software and may include unintentional dictation errors.

## 2023-05-23 ENCOUNTER — Encounter: Payer: Self-pay | Admitting: Physician Assistant

## 2023-05-23 ENCOUNTER — Ambulatory Visit: Payer: Self-pay | Admitting: Physician Assistant

## 2023-05-23 DIAGNOSIS — W57XXXA Bitten or stung by nonvenomous insect and other nonvenomous arthropods, initial encounter: Secondary | ICD-10-CM

## 2023-05-23 MED ORDER — DOXYCYCLINE MONOHYDRATE 100 MG PO CAPS: 100.0000 mg | ORAL_CAPSULE | Freq: Two times a day (BID) | ORAL | 0 refills | Status: DC

## 2023-05-23 NOTE — Progress Notes (Signed)
Pt presents today with tick bite on ankle x 1 week. Pt states his joints and body have been very achy. Tylenol to help but as soon as it wears off his body is back to being sore.

## 2023-05-23 NOTE — Progress Notes (Signed)
   Subjective: Tick bite right posterior ankle    Patient ID: John [name], male    DOB: 02-Oct-1961, 62 y.o.   MRN: [id number]  HPI Patient states Movatec for his left ankle approximately a week ago.  Patient states 2 days ago he started experiencing joint pain and headache.  Denies nausea vomiting diarrhea.  No fever associated with complaint.   Review of Systems Gilberts syndrome, hyperlipidemia, hypertension, and BPH.    Objective:   Physical Exam Vital signs not taken. Patient has erythema measuring approximately 1.5 cm right posterior heel.  No drainage.       Assessment & Plan: Tick bite  Patient started on doxycycline 100 mg twice daily.  Labs for [facility] spotted fever and Lyme disease were drawn.  Will follow-up status post labs.

## 2023-05-25 DIAGNOSIS — N400 Enlarged prostate without lower urinary tract symptoms: Secondary | ICD-10-CM | POA: Diagnosis not present

## 2023-05-25 DIAGNOSIS — Z79891 Long term (current) use of opiate analgesic: Secondary | ICD-10-CM | POA: Diagnosis not present

## 2023-05-25 DIAGNOSIS — M79661 Pain in right lower leg: Secondary | ICD-10-CM | POA: Diagnosis not present

## 2023-05-25 DIAGNOSIS — L0390 Cellulitis, unspecified: Secondary | ICD-10-CM | POA: Diagnosis not present

## 2023-05-25 DIAGNOSIS — E785 Hyperlipidemia, unspecified: Secondary | ICD-10-CM | POA: Diagnosis not present

## 2023-05-25 DIAGNOSIS — S90561A Insect bite (nonvenomous), right ankle, initial encounter: Secondary | ICD-10-CM | POA: Diagnosis not present

## 2023-05-25 DIAGNOSIS — Z7902 Long term (current) use of antithrombotics/antiplatelets: Secondary | ICD-10-CM | POA: Diagnosis not present

## 2023-05-25 DIAGNOSIS — N183 Chronic kidney disease, stage 3 unspecified: Secondary | ICD-10-CM | POA: Diagnosis not present

## 2023-05-25 DIAGNOSIS — I129 Hypertensive chronic kidney disease with stage 1 through stage 4 chronic kidney disease, or unspecified chronic kidney disease: Secondary | ICD-10-CM | POA: Diagnosis not present

## 2023-05-25 DIAGNOSIS — R7989 Other specified abnormal findings of blood chemistry: Secondary | ICD-10-CM | POA: Diagnosis not present

## 2023-05-25 DIAGNOSIS — R519 Headache, unspecified: Secondary | ICD-10-CM | POA: Diagnosis not present

## 2023-05-25 DIAGNOSIS — R6883 Chills (without fever): Secondary | ICD-10-CM | POA: Diagnosis not present

## 2023-05-25 DIAGNOSIS — L03115 Cellulitis of right lower limb: Secondary | ICD-10-CM | POA: Diagnosis not present

## 2023-06-01 LAB — SPOTTED FEVER GROUP ANTIBODIES
Spotted Fever Group IgG: <1:64 {titer}
Spotted Fever Group IgM: <1:64 {titer}

## 2023-06-01 LAB — LYME DISEASE SEROLOGY W/REFLEX: Lyme Total Antibody EIA: NEGATIVE

## 2023-06-09 ENCOUNTER — Other Ambulatory Visit: Payer: Self-pay | Admitting: *Deleted

## 2023-06-09 ENCOUNTER — Ambulatory Visit (INDEPENDENT_AMBULATORY_CARE_PROVIDER_SITE_OTHER): Payer: 59 | Admitting: Physician Assistant

## 2023-06-09 ENCOUNTER — Telehealth: Payer: Self-pay | Admitting: *Deleted

## 2023-06-09 ENCOUNTER — Ambulatory Visit
Admission: RE | Admit: 2023-06-09 | Discharge: 2023-06-09 | Disposition: A | Discharge status: Home / Self Care | Payer: 59 | Attending: Physician Assistant | Admitting: Physician Assistant

## 2023-06-09 ENCOUNTER — Ambulatory Visit
Admission: RE | Admit: 2023-06-09 | Discharge: 2023-06-09 | Disposition: A | Discharge status: Home / Self Care | Payer: 59 | Source: Ambulatory Visit | Attending: Physician Assistant | Admitting: Physician Assistant

## 2023-06-09 VITALS — BP 134/72 | HR 76 | Ht 72.0 in | Wt 225.0 lb

## 2023-06-09 DIAGNOSIS — N200 Calculus of kidney: Secondary | ICD-10-CM

## 2023-06-09 DIAGNOSIS — R109 Unspecified abdominal pain: Secondary | ICD-10-CM | POA: Diagnosis not present

## 2023-06-09 DIAGNOSIS — N2889 Other specified disorders of kidney and ureter: Secondary | ICD-10-CM | POA: Diagnosis not present

## 2023-06-09 DIAGNOSIS — Z87442 Personal history of urinary calculi: Secondary | ICD-10-CM

## 2023-06-09 DIAGNOSIS — R319 Hematuria, unspecified: Secondary | ICD-10-CM | POA: Diagnosis not present

## 2023-06-09 LAB — MICROSCOPIC EXAMINATION: RBC, Urine: >30 /HPF — AB (ref 0–2)

## 2023-06-09 LAB — URINALYSIS, COMPLETE

## 2023-06-09 MED ORDER — ONDANSETRON 4 MG PO TBDP: 4.0000 mg | ORAL_TABLET | Freq: Three times a day (TID) | ORAL | 0 refills | Status: DC | PRN

## 2023-06-09 MED ORDER — OXYCODONE-ACETAMINOPHEN 5-325 MG PO TABS: 1.0000 | ORAL_TABLET | Freq: Four times a day (QID) | ORAL | 0 refills | Status: AC | PRN

## 2023-06-09 NOTE — Progress Notes (Signed)
06/09/2023 12:46 PM   Faylene Million Yaworski 1961-02-23 [id number]  CC: Chief Complaint  Patient presents with   Follow-up   HPI: John [name] is a 62 y.o. male with PMH BPH with LUTS on Flomax and nephrolithiasis who presents today for evaluation of a possible acute stone episode.   Today he reports an approximate 1-day history of right flank discomfort, nausea, and gross hematuria.  KUB today with an apparent 3 to 4 mm distal right ureteral stone.  There are numerous tiny bilateral renal stones.  In-office UA today positive for red color, unable to read due to color interference; urine microscopy with >30 RBCs/HPF and many bacteria.  PMH: Past Medical History:  Diagnosis Date   History of kidney stones    Hyperlipidemia    Hypertension     Surgical History: Past Surgical History:  Procedure Laterality Date   BACK SURGERY     CHOLECYSTECTOMY     CYSTOSCOPY WITH LITHOLAPAXY N/A 12/18/2018   Procedure: CYSTOSCOPY WITH LITHOLAPAXY;  Surgeon: Riki Altes, MD;  Location: [facility];  Service: Urology;  Laterality: N/A;   CYSTOSCOPY/URETEROSCOPY/HOLMIUM LASER/STENT PLACEMENT Left 10/12/2017   Procedure: CYSTOSCOPY/URETEROSCOPY/HOLMIUM LASER/STENT PLACEMENT;  Surgeon: Riki Altes, MD;  Location: [facility];  Service: Urology;  Laterality: Left;   LUMBAR FUSION     L5S1   SHOULDER ARTHROSCOPY WITH SUBACROMIAL DECOMPRESSION Right 10/27/2016   Procedure: SHOULDER ARTHROSCOPY WITH SUBACROMIAL DECOMPRESSION  AND DEBRIDEMENT;  Surgeon: Christena Flake, MD;  Location: [facility];  Service: Orthopedics;  Laterality: Right;   SHOULDER SURGERY Right 2003    Home Medications:  Allergies as of 06/09/2023   No Known Allergies      Medication List        Accurate as of June 09, 2023 12:46 PM. If you have any questions, ask your nurse or doctor.          allopurinol 100 MG tablet Commonly known as: ZYLOPRIM Take 2 tablets (200 mg total) by mouth at bedtime.   doxycycline 100 MG  capsule Commonly known as: MONODOX Take 1 capsule (100 mg total) by mouth 2 (two) times daily.   losartan 50 MG tablet Commonly known as: COZAAR Take 50 mg by mouth daily.   rosuvastatin 40 MG tablet Commonly known as: CRESTOR Take 1 tablet (40 mg total) by mouth daily.   tamsulosin 0.4 MG Caps capsule Commonly known as: FLOMAX TAKE 1 CAPSULE(0.4 MG) BY MOUTH AT BEDTIME        Allergies:  No Known Allergies  Family History: Family History  Problem Relation Age of Onset   Heart disease Father    Prostate cancer Neg Hx    Bladder Cancer Neg Hx    Kidney cancer Neg Hx     Social History:   reports that he has never smoked. He has quit using smokeless tobacco.  His smokeless tobacco use included snuff. He reports that he does not drink alcohol and does not use drugs.  Physical Exam: There were no vitals taken for this visit.  Constitutional:  Alert and oriented, no acute distress, nontoxic appearing HEENT: [place], AT Cardiovascular: No clubbing, cyanosis, or edema Respiratory: Normal respiratory effort, no increased work of breathing Skin: No rashes, bruises or suspicious lesions Neurologic: Grossly intact, no focal deficits, moving all 4 extremities Psychiatric: Normal mood and affect  Laboratory Data: Results for orders placed or performed in visit on 06/09/23  Microscopic Examination   Urine  Result Value Ref Range   WBC, UA  0-5 0 - 5 /hpf   RBC, Urine >30 (A) 0 - 2 /hpf   Epithelial Cells (non renal) 0-10 0 - 10 /hpf   Bacteria, UA Many (A) None seen/Few  Urinalysis, Complete  Result Value Ref Range   Specific Gravity, UA CANCELED    pH, UA CANCELED    Color, UA Red (A) Yellow   Appearance Ur Cloudy (A) Clear   Protein,UA CANCELED    Glucose, UA CANCELED    Ketones, UA CANCELED    Microscopic Examination See below:    Pertinent Imaging: KUB, 06/09/2023: CLINICAL DATA:  Hematuria   EXAM: ABDOMEN - 1 VIEW   COMPARISON:  08/10/2022.   FINDINGS: The  bowel gas pattern is normal. Innumerable calcifications overlying both kidneys in the 2-3 mm range in size consistent with nephrolithiasis. No change compared to the prior study. Lumbosacral postop changes noted. There are cholecystectomy clips.   IMPRESSION: Numerous small bilateral renal stones.     Electronically Signed   By: Layla Maw M.D.   On: 06/21/2023 16:00  I personally reviewed the images referenced above and note a radiopaque density in the right hemipelvis adjacent to a known phlebolith measuring 3 to 4 mm consistent with a distal right ureteral stone.  Assessment & Plan:   1. Flank pain with history of urolithiasis UA today with gross hematuria consistent with acute stone episode.  I do think I see a distal right ureteral stone on KUB.  I offered him CT stone study for confirmation, but he declined.  Given overall minimal discomfort for now, he wants to pursue trial of passage, which is reasonable.  I encouraged him to increase Flomax to twice daily and a prescribing Percocet and Zofran for symptom control in the meantime.  Will see him back in clinic in 2 to 3 weeks for stone follow-up.  I am following him a bit closer that I would otherwise because he is planning to go on a hunting trip in a month and would like this resolved before then, so that will give [place] time to pursue definitive stone management if he does not pass the stone spontaneously prior.  We discussed return precautions including fever, uncontrolled pain, and uncontrolled nausea/vomiting. - Urinalysis, Complete - CULTURE, URINE COMPREHENSIVE - oxyCODONE-acetaminophen (PERCOCET/ROXICET) 5-325 MG tablet; Take 1-2 tablets by mouth every 6 (six) hours as needed for up to 5 days for severe pain.  Dispense: 15 tablet; Refill: 0 - ondansetron (ZOFRAN-ODT) 4 MG disintegrating tablet; Take 1 tablet (4 mg total) by mouth every 8 (eight) hours as needed for nausea or vomiting.  Dispense: 20 tablet; Refill: 0    Return in about 3 weeks (around 06/30/2023) for Stone f/u with UA + KUB prior.  Carman Ching, PA-C  [facility]  [address] [place], [place] [address] [phone]

## 2023-06-09 NOTE — Patient Instructions (Signed)
For the next 2-3 weeks, please do the following: -Take Flomax 0.4mg  twice daily -Stay well hydrated -Treat any pain with ibuprofen/tylenol or Percocet as prescribed today -Treat any nausea with Zofran as prescribed today  I will plan to see you back in clinic in 2-3 weeks with another x-ray prior to see if you have passed a stone.  Please call our office immediately (we are open 8a-5p Monday-Friday) or go to the Emergency Department if you develop any of the following: -Fever/chills -Nausea and/or vomiting uncontrollable with Zofran -Pain uncontrollable with Percocet

## 2023-06-09 NOTE — Telephone Encounter (Signed)
Patient called in today and states he is passing blood for two day . No fever just a ache in his right upper quadrant.  He has a history of kidney stones.

## 2023-06-12 NOTE — Group Note (Deleted)

## 2023-06-13 LAB — CULTURE, URINE COMPREHENSIVE

## 2023-06-27 ENCOUNTER — Ambulatory Visit
Admission: RE | Admit: 2023-06-27 | Discharge: 2023-06-27 | Disposition: A | Discharge status: Home / Self Care | Payer: 59 | Attending: Physician Assistant | Admitting: Physician Assistant

## 2023-06-27 ENCOUNTER — Ambulatory Visit
Admission: RE | Admit: 2023-06-27 | Discharge: 2023-06-27 | Disposition: A | Discharge status: Home / Self Care | Payer: 59 | Source: Ambulatory Visit | Attending: Physician Assistant | Admitting: Physician Assistant

## 2023-06-27 ENCOUNTER — Encounter: Payer: Self-pay | Admitting: Physician Assistant

## 2023-06-27 ENCOUNTER — Ambulatory Visit: Payer: 59 | Admitting: Physician Assistant

## 2023-06-27 VITALS — BP 116/75 | HR 70 | Ht 72.0 in | Wt 225.0 lb

## 2023-06-27 DIAGNOSIS — N200 Calculus of kidney: Secondary | ICD-10-CM | POA: Diagnosis not present

## 2023-06-27 DIAGNOSIS — Z981 Arthrodesis status: Secondary | ICD-10-CM | POA: Diagnosis not present

## 2023-06-27 DIAGNOSIS — R109 Unspecified abdominal pain: Secondary | ICD-10-CM | POA: Insufficient documentation

## 2023-06-27 DIAGNOSIS — R93421 Abnormal radiologic findings on diagnostic imaging of right kidney: Secondary | ICD-10-CM

## 2023-06-27 DIAGNOSIS — Z87442 Personal history of urinary calculi: Secondary | ICD-10-CM | POA: Insufficient documentation

## 2023-06-27 LAB — URINALYSIS, COMPLETE
Bilirubin, UA: NEGATIVE
Glucose, UA: NEGATIVE
Ketones, UA: NEGATIVE
Leukocytes,UA: NEGATIVE
Nitrite, UA: NEGATIVE
Specific Gravity, UA: 1.025 (ref 1.005–1.030)
Urobilinogen, Ur: 0.2 mg/dL (ref 0.2–1.0)
pH, UA: 5.5 (ref 5.0–7.5)

## 2023-06-27 LAB — MICROSCOPIC EXAMINATION

## 2023-06-29 LAB — CULTURE, URINE COMPREHENSIVE

## 2023-07-04 ENCOUNTER — Ambulatory Visit
Admission: RE | Admit: 2023-07-04 | Discharge: 2023-07-04 | Disposition: A | Discharge status: Home / Self Care | Payer: 59 | Source: Ambulatory Visit | Attending: Physician Assistant | Admitting: Physician Assistant

## 2023-07-04 DIAGNOSIS — N400 Enlarged prostate without lower urinary tract symptoms: Secondary | ICD-10-CM | POA: Diagnosis not present

## 2023-07-04 DIAGNOSIS — K449 Diaphragmatic hernia without obstruction or gangrene: Secondary | ICD-10-CM | POA: Diagnosis not present

## 2023-07-04 DIAGNOSIS — N200 Calculus of kidney: Secondary | ICD-10-CM | POA: Insufficient documentation

## 2023-07-04 DIAGNOSIS — K5730 Diverticulosis of large intestine without perforation or abscess without bleeding: Secondary | ICD-10-CM | POA: Diagnosis not present

## 2023-07-11 DIAGNOSIS — M109 Gout, unspecified: Secondary | ICD-10-CM | POA: Diagnosis not present

## 2023-07-11 DIAGNOSIS — N1831 Chronic kidney disease, stage 3a: Secondary | ICD-10-CM | POA: Diagnosis not present

## 2023-07-11 DIAGNOSIS — I10 Essential (primary) hypertension: Secondary | ICD-10-CM | POA: Diagnosis not present

## 2023-07-11 DIAGNOSIS — N200 Calculus of kidney: Secondary | ICD-10-CM | POA: Diagnosis not present

## 2023-07-14 ENCOUNTER — Telehealth: Payer: Self-pay | Admitting: Physician Assistant

## 2023-07-14 NOTE — Telephone Encounter (Signed)
Shared results via MyChart. Still waiting for official radiology report.

## 2023-07-14 NOTE — Telephone Encounter (Signed)
Pt called asking about CT results from 10/1.

## 2023-08-04 ENCOUNTER — Other Ambulatory Visit: Payer: Self-pay | Admitting: *Deleted

## 2023-08-04 DIAGNOSIS — N200 Calculus of kidney: Secondary | ICD-10-CM

## 2023-08-11 ENCOUNTER — Encounter: Payer: Self-pay | Admitting: Urology

## 2023-08-11 ENCOUNTER — Ambulatory Visit (INDEPENDENT_AMBULATORY_CARE_PROVIDER_SITE_OTHER): Payer: 59 | Admitting: Urology

## 2023-08-11 ENCOUNTER — Ambulatory Visit
Admission: RE | Admit: 2023-08-11 | Discharge: 2023-08-11 | Disposition: A | Discharge status: Home / Self Care | Payer: 59 | Attending: Urology | Admitting: Urology

## 2023-08-11 ENCOUNTER — Ambulatory Visit
Admission: RE | Admit: 2023-08-11 | Discharge: 2023-08-11 | Disposition: A | Discharge status: Home / Self Care | Payer: 59 | Source: Ambulatory Visit | Attending: Urology | Admitting: Urology

## 2023-08-11 VITALS — BP 121/75 | HR 65 | Ht 72.0 in | Wt 225.0 lb

## 2023-08-11 DIAGNOSIS — N2889 Other specified disorders of kidney and ureter: Secondary | ICD-10-CM | POA: Diagnosis not present

## 2023-08-11 DIAGNOSIS — N401 Enlarged prostate with lower urinary tract symptoms: Secondary | ICD-10-CM | POA: Diagnosis not present

## 2023-08-11 DIAGNOSIS — N200 Calculus of kidney: Secondary | ICD-10-CM

## 2023-08-11 MED ORDER — TAMSULOSIN HCL 0.4 MG PO CAPS: 0.8000 mg | ORAL_CAPSULE | Freq: Every day | ORAL | 0 refills | Status: DC

## 2023-08-11 NOTE — Progress Notes (Signed)
I, John [name], acting as a scribe for Riki Altes, MD., have documented all relevant documentation on the behalf of Riki Altes, MD, as directed by Riki Altes, MD while in the presence of Riki Altes, MD.  08/11/2023 3:41 PM   John [name] May 18, 1961 [id number]  Referring provider: Jaclyn Shaggy, MD [address]   [place],  [place] [address]  Chief Complaint  Patient presents with   Nephrolithiasis   Urologic history: 1.  Recurrent stone disease Bilateral nonobstructing renal calculi Cystolitholapaxy 12/2018 Ureteroscopic stone removal 10/2017   2.  BPH with LUTS Tamsulosin 0.4 mg daily  HPI: John [name] is a 62 y.o. male presents for annual follow-up.   He had an acute PA visit here 06/09/2023 with complaints of right flank pain and gross hematuria for 1 day; KUB showed a calcification in the vicinity of the right distal ureter. In follow-up, he was having persistent pain and a renal stone CT was ordered, which was performed on 07/04/2023. This did show bilateral non-obstructing renal calculi and multiple bilateral renal cysts. There were 2 calculi in the bladder measuring 4 mm consistent with recently passed stones. The patient's pain had resolved.  He was incidentally found to have a 3.4 cm right lower pole indeterminate lesion with a thick rim not seen on prior studies for which a renal mass protocol MRI was recommended.  He has noted some worsening voiding symptoms since his last visit including increased frequency and urgency. He does take tamsulosin 0.4 mg daily. A recent PSA 05/09/23 was stable at 2.7    PMH: Past Medical History:  Diagnosis Date   History of kidney stones    Hyperlipidemia    Hypertension     Surgical History: Past Surgical History:  Procedure Laterality Date   BACK SURGERY     CHOLECYSTECTOMY     CYSTOSCOPY WITH LITHOLAPAXY N/A 12/18/2018   Procedure: CYSTOSCOPY WITH LITHOLAPAXY;  Surgeon: Riki Altes, MD;   Location: [facility];  Service: Urology;  Laterality: N/A;   CYSTOSCOPY/URETEROSCOPY/HOLMIUM LASER/STENT PLACEMENT Left 10/12/2017   Procedure: CYSTOSCOPY/URETEROSCOPY/HOLMIUM LASER/STENT PLACEMENT;  Surgeon: Riki Altes, MD;  Location: [facility];  Service: Urology;  Laterality: Left;   LUMBAR FUSION     L5S1   SHOULDER ARTHROSCOPY WITH SUBACROMIAL DECOMPRESSION Right 10/27/2016   Procedure: SHOULDER ARTHROSCOPY WITH SUBACROMIAL DECOMPRESSION  AND DEBRIDEMENT;  Surgeon: Christena Flake, MD;  Location: [facility];  Service: Orthopedics;  Laterality: Right;   SHOULDER SURGERY Right 2003    Home Medications:  Allergies as of 08/11/2023   No Known Allergies      Medication List        Accurate as of August 11, 2023  3:41 PM. If you have any questions, ask your nurse or doctor.          allopurinol 100 MG tablet Commonly known as: ZYLOPRIM Take 2 tablets (200 mg total) by mouth at bedtime.   losartan 50 MG tablet Commonly known as: COZAAR Take 50 mg by mouth daily.   rosuvastatin 40 MG tablet Commonly known as: CRESTOR Take 1 tablet (40 mg total) by mouth daily.   tamsulosin 0.4 MG Caps capsule Commonly known as: FLOMAX Take 2 capsules (0.8 mg total) by mouth daily. What changed: See the new instructions. Changed by: Riki Altes        Allergies: No Known Allergies  Family History: Family History  Problem Relation Age of Onset  Heart disease Father    Prostate cancer Neg Hx    Bladder Cancer Neg Hx    Kidney cancer Neg Hx     Social History:  reports that he has never smoked. He has quit using smokeless tobacco.  His smokeless tobacco use included snuff. He reports that he does not drink alcohol and does not use drugs.   Physical Exam: BP 121/75   Pulse 65   Ht 6' (1.829 m)   Wt 225 lb (102.1 kg)   BMI 30.52 kg/m   Constitutional:  Alert, No acute distress. HEENT: [place] AT Respiratory: Normal respiratory effort, no increased work of  breathing. Psychiatric: Normal mood and affect.   Pertinent Imaging: Renal stone CT was personally reviewed and interpreted. KUB performed today shows calcifications in the true bony pelvis which may represent his recently passed stones.  CT RENAL STONE STUDY  Narrative CLINICAL DATA:  Abdominal/flank pain, stone suspected  EXAM: CT ABDOMEN AND PELVIS WITHOUT CONTRAST  TECHNIQUE: Multidetector CT imaging of the abdomen and pelvis was performed following the standard protocol without IV contrast.  RADIATION DOSE REDUCTION: This exam was performed according to the departmental dose-optimization program which includes automated exposure control, adjustment of the mA and/or kV according to patient size and/or use of iterative reconstruction technique.  COMPARISON:  Radiograph 06/27/2023. abdominopelvic CT 02/24/2018  FINDINGS: Lower chest: Clear lung bases.  Hepatobiliary: Mild diffuse hepatic steatosis. No evidence of focal liver abnormality on this unenhanced exam. Clips in the gallbladder fossa postcholecystectomy. No biliary dilatation.  Pancreas: No ductal dilatation or inflammation.  Spleen: Normal in size without focal abnormality. Splenule inferiorly.  Adrenals/Urinary Tract: No adrenal nodule. There are multiple bilateral intrarenal calculi. Greater than 10 stones are seen within both kidneys. The largest stone is in the upper pole on the right measuring 6 mm. There are multiple bilateral simple cysts in each kidney measuring water density. These cyst need no further imaging follow-up. Indeterminate lesion arising from the lower pole of the right kidney may have a thick rim measuring 3.4 cm, series 2, image 38. This was not seen definitively seen on prior exam, possible hyperdense lesion in this region on prior but not definite.  No hydronephrosis of either kidney. Mild prominence of the proximal right ureter. No ureteral calculi. There is a 4 mm stone in  the right urinary bladder, not in the region of the urinary trigone. There is a second 4 mm stone in the midline urinary bladder. Urinary bladder is only partially distended.  Stomach/Bowel: Small hiatal hernia. No small bowel obstruction or inflammatory change. Diminutive normal appendix. Multifocal colonic diverticulosis. Diverticular changes are prominent involving the distal descending and sigmoid colon. No definite acute diverticulitis or colonic inflammatory change.  Vascular/Lymphatic: Minimal aortic atherosclerosis. No aortic aneurysm. Few prominent portal caval nodes are typically reactive. No enlarged lymph nodes in the abdomen or pelvis.  Reproductive: Enlarged prostate gland causing mass effect on the bladder base. The prostate spans 5.6 cm transverse.  Other: No ascites. Diminutive fat containing umbilical hernia. Small fat containing left inguinal hernia.  Musculoskeletal: L5-S1 posterior fusion hardware. Bone island in the left proximal femur. Minimal degenerative change of the sacroiliac joints. There are no acute or suspicious osseous abnormalities.  IMPRESSION: 1. Two 4 mm stones in the urinary bladder. Mild prominence of the right ureter suggesting recently passed stone. No frank hydronephrosis. 2. Multiple bilateral intrarenal calculi. 3. Indeterminate lesion arising from the lower pole of the right kidney may have a thick rim measuring  3.4 cm. Recommend further evaluation with renal mass protocol MRI. 4. Colonic diverticulosis without acute inflammation. 5. Enlarged prostate gland causing mass effect on the bladder base. 6. Mild hepatic steatosis. 7. Small hiatal hernia.  Aortic Atherosclerosis (ICD10-I70.0).   Electronically Signed By: Narda Rutherford M.D. On: 07/15/2023 17:43   Assessment & Plan:    1. Nephrolithiasis Bilateral, non-obstructing renal calculi.  Recently passed ureteral calculi.   2. Indeterminate right renal mass New  finding Schedule renal mass protocol MRI and we'll notify with results.   3. BPH with LUTS Will titrate tamsulosin 0.8 mg x30 days.  If no symptom improvement, will give a trial of low dose tadalafil.   I have reviewed the above documentation for accuracy and completeness, and I agree with the above.   Riki Altes, MD  [facility] [address] [place], [place] [address] [phone]

## 2023-08-13 ENCOUNTER — Encounter: Payer: Self-pay | Admitting: Urology

## 2023-08-14 DIAGNOSIS — Z860100 Personal history of colon polyps, unspecified: Secondary | ICD-10-CM | POA: Diagnosis not present

## 2023-08-14 DIAGNOSIS — K582 Mixed irritable bowel syndrome: Secondary | ICD-10-CM | POA: Diagnosis not present

## 2023-08-22 ENCOUNTER — Ambulatory Visit
Admission: RE | Admit: 2023-08-22 | Discharge: 2023-08-22 | Disposition: A | Discharge status: Home / Self Care | Payer: 59 | Source: Ambulatory Visit | Attending: Urology | Admitting: Urology

## 2023-08-22 DIAGNOSIS — K760 Fatty (change of) liver, not elsewhere classified: Secondary | ICD-10-CM | POA: Diagnosis not present

## 2023-08-22 DIAGNOSIS — Z9049 Acquired absence of other specified parts of digestive tract: Secondary | ICD-10-CM | POA: Diagnosis not present

## 2023-08-22 DIAGNOSIS — N2889 Other specified disorders of kidney and ureter: Secondary | ICD-10-CM

## 2023-08-22 MED ORDER — GADOBUTROL 1 MMOL/ML IV SOLN
10.0000 mL | Freq: Once | INTRAVENOUS | Status: AC | PRN
  Administered 2023-08-22: 10 mL via INTRAVENOUS

## 2023-08-24 ENCOUNTER — Telehealth: Payer: Self-pay | Admitting: Urology

## 2023-08-24 NOTE — Telephone Encounter (Signed)
I contacted John [name] to discuss his renal mass protocol MRI results.  The indeterminate lesion seen on CT is a 5 x 3.7 cm heterogeneously enhancing cystic right renal mass in the lower pole of the right kidney and a 1.9 x 1.7 cm similar mass in the renal sinus.  I discussed these findings are suspicious for cystic renal cell carcinoma.  He has an appointment tomorrow morning with Dr. Apolinar Junes to discuss surgical options.  All of his questions were answered at this time

## 2023-08-25 ENCOUNTER — Ambulatory Visit: Payer: 59 | Admitting: Urology

## 2023-08-25 ENCOUNTER — Encounter: Payer: Self-pay | Admitting: Urology

## 2023-08-25 VITALS — BP 125/79 | HR 88 | Ht 72.0 in | Wt 225.4 lb

## 2023-08-25 DIAGNOSIS — N1831 Chronic kidney disease, stage 3a: Secondary | ICD-10-CM | POA: Diagnosis not present

## 2023-08-25 DIAGNOSIS — N2889 Other specified disorders of kidney and ureter: Secondary | ICD-10-CM

## 2023-08-25 NOTE — Progress Notes (Signed)
Marcelle Overlie Plume,acting as a scribe for John Scotland, MD.,have documented all relevant documentation on the behalf of John Scotland, MD,as directed by  John Scotland, MD while in the presence of John Scotland, MD.  08/25/2023 9:56 AM   Faylene Million Capps 1961/06/19 [id number]  Referring provider: Jaclyn Shaggy, MD [address]   [place],  [place] [address]  Chief Complaint  Patient presents with   Renal Mass    HPI:  62 year-old male with an incidental renal mass that was initially indeterminate.   He had an acute PA visit here 06/09/2023 with complaints of right flank pain and gross hematuria for 1 day; KUB showed a calcification in the vicinity of the right distal ureter.  In follow-up, he was having persistent pain and a renal stone CT was ordered, which was performed on 07/04/2023. This did show bilateral non-obstructing renal calculi and multiple bilateral renal cysts.   He follows up today with an MRI that shows a right lower pole mass measuring 5.0 by 3.7 cm. This was read as a Bosniak 4 with cystic and solid components. A second mass within the right renal sinus measuring 1.9 by 1.7 cm was also visualized.   This been increasing in size since 2019 when first identified (~ 2 cm).  He is followed by Dr. Cherylann Ratel for CKD.  His baseline creatinine is 1.5.  He was previously seen by Dr. Lonna Cobb. He is here to discuss his surgical options today.    PMH: Past Medical History:  Diagnosis Date   History of kidney stones    Hyperlipidemia    Hypertension     Surgical History: Past Surgical History:  Procedure Laterality Date   BACK SURGERY     CHOLECYSTECTOMY     CYSTOSCOPY WITH LITHOLAPAXY N/A 12/18/2018   Procedure: CYSTOSCOPY WITH LITHOLAPAXY;  Surgeon: Riki Altes, MD;  Location: [facility];  Service: Urology;  Laterality: N/A;   CYSTOSCOPY/URETEROSCOPY/HOLMIUM LASER/STENT PLACEMENT Left 10/12/2017   Procedure: CYSTOSCOPY/URETEROSCOPY/HOLMIUM LASER/STENT  PLACEMENT;  Surgeon: Riki Altes, MD;  Location: [facility];  Service: Urology;  Laterality: Left;   LUMBAR FUSION     L5S1   SHOULDER ARTHROSCOPY WITH SUBACROMIAL DECOMPRESSION Right 10/27/2016   Procedure: SHOULDER ARTHROSCOPY WITH SUBACROMIAL DECOMPRESSION  AND DEBRIDEMENT;  Surgeon: Christena Flake, MD;  Location: [facility];  Service: Orthopedics;  Laterality: Right;   SHOULDER SURGERY Right 2003    Home Medications:  Allergies as of 08/25/2023   No Known Allergies      Medication List        Accurate as of August 25, 2023  9:56 AM. If you have any questions, ask your nurse or doctor.          allopurinol 100 MG tablet Commonly known as: ZYLOPRIM Take 2 tablets (200 mg total) by mouth at bedtime.   hyoscyamine 0.125 MG SL tablet Commonly known as: LEVSIN SL Place under the tongue. Place 1 tablet (0.125 mg total) under the tongue every 6 (six) hours as needed for Cramping   losartan 50 MG tablet Commonly known as: COZAAR Take 50 mg by mouth daily.   rosuvastatin 40 MG tablet Commonly known as: CRESTOR Take 1 tablet (40 mg total) by mouth daily.   tamsulosin 0.4 MG Caps capsule Commonly known as: FLOMAX Take 2 capsules (0.8 mg total) by mouth daily.         Family History: Family History  Problem Relation Age of Onset   Heart disease Father  Prostate cancer Neg Hx    Bladder Cancer Neg Hx    Kidney cancer Neg Hx     Social History:  reports that he has never smoked. He has quit using smokeless tobacco.  His smokeless tobacco use included snuff. He reports that he does not drink alcohol and does not use drugs.   Physical Exam: BP 125/79 (BP Location: Left Arm, Patient Position: Sitting, Cuff Size: Large)   Pulse 88   Ht 6' (1.829 m)   Wt 225 lb 6.4 oz (102.2 kg)   BMI 30.57 kg/m   Constitutional:  Alert and oriented, No acute distress.  Accompanied by his wife today. HEENT: [place] AT, moist mucus membranes.  Trachea midline, no masses. Neurologic:  Grossly intact, no focal deficits, moving all 4 extremities. Abdomen: Soft nontender no scarring appreciated. Psychiatric: Normal mood and affect.   Pertinent Imaging: EXAM: MRI ABDOMEN WITHOUT AND WITH CONTRAST   TECHNIQUE: Multiplanar multisequence MR imaging of the abdomen was performed both before and after the administration of intravenous contrast.   CONTRAST:  10mL GADAVIST GADOBUTROL 1 MMOL/ML IV SOLN   COMPARISON:  CT abdomen pelvis, 07/04/2023   FINDINGS: Lower chest: No acute abnormality.   Hepatobiliary: No solid liver abnormality is seen. Hepatic steatosis. Status post cholecystectomy. No biliary ductal dilatation.   Pancreas: Unremarkable. No pancreatic ductal dilatation or surrounding inflammatory changes.   Spleen: Normal in size without significant abnormality.   Adrenals/Urinary Tract: Adrenal glands are unremarkable. Heterogeneously enhancing mixed solid and cystic mass arising from the posterior inferior pole of the right kidney measuring 5.0 x 3.7 cm (series 14, image 66). Additional similar mass within the right renal sinus measuring 1.9 x 1.7 cm (series 14, image 54). Numerous additional simple and thinly septated, benign fluid signal renal cortical cysts, for which no further follow-up or characterization is required. Numerous small bilateral renal calculi not well appreciated by MR. No hydronephrosis.   Stomach/Bowel: Stomach is within normal limits. No evidence of bowel wall thickening, distention, or inflammatory changes. Pancolonic diverticulosis.   Vascular/Lymphatic: No significant vascular findings are present. No enlarged abdominal lymph nodes.   Other: No abdominal wall hernia or abnormality. No ascites.   Musculoskeletal: No acute or significant osseous findings.   IMPRESSION: 1. Heterogeneously enhancing mixed solid and cystic mass arising from the posterior inferior pole of the right kidney measuring 5.0 x 3.7 cm. 2. Additional  similar mass within the right renal sinus measuring 1.9 x 1.7 cm. 3. Findings are consistent with multifocal renal cell carcinoma (Bosniak category IV). 4. No evidence of renal vein invasion, lymphadenopathy, or metastatic disease in the abdomen. 5. Numerous small bilateral renal calculi not well appreciated by MR. No hydronephrosis. 6. Hepatic steatosis.   These results will be called to the ordering clinician or representative by the Radiologist Assistant, and communication documented in the PACS or [employer].     Electronically Signed   By: Jearld Lesch M.D.   On: 08/23/2023 15:26  This was personally reviewed and I agree with the radiologic interpretation.  This was also compared to previous renal ultrasounds and cross-sectional imaging for historical perspective.  Assessment & Plan:    1. Renal mass - He has two masses on the right kidney - The larger mass is located at the lower pole of the kidney classified as Bosniak 4 with cystic and solid enhancing components with significant interval growth since 2019.  There is also very similar central cystic solid lesion concerning for multifocality. - Proceed with  right nephrectomy due to the high suspicion of cancer; unfortunately partial nephrectomy is not technically feasible given the location of the more central tumor and liver lesion is too large for ablative technique. - Discussed the risks of nephrectomy in detail including bleeding, infection, hernias, blood clot, stroke, heart attack, etc.  - CT of the chest without contrast to complete staging and rule out metastasis, particularly to the lungs. - Communicate with Dr. Cherylann Ratel regarding his renal status and management plan. - Schedule laparoscopic nephrectomy for January, post-holidays, to allow for his recovery and completion of other medical evaluations (e.g., colonoscopy). - Discussed the implications of living with one kidney, including the risk of chronic kidney  disease progression and the importance of maintaining hydration and blood pressure control   2. CKD, Stage 3a - Monitor renal function post-nephrectomy, anticipating an increase in creatinine levels to approximately 2.1, indicating progression to stage 3B or 4.  We also discussed the risk of progression to end-stage renal disease. - Continue current blood pressure management to protect renal function. - Follow up with nephrology as needed for optimization of renal health.  Return for nephrectomy.  I have reviewed the above documentation for accuracy and completeness, and I agree with the above.   John Scotland, MD    [facility] [address], [address] [place], [place] [address] [phone]  I spent 46 total minutes on the day of the encounter including pre-visit review of the medical record, face-to-face time with the patient, and post visit ordering of labs/imaging/tests.

## 2023-08-28 ENCOUNTER — Other Ambulatory Visit: Payer: Self-pay

## 2023-08-28 DIAGNOSIS — N2889 Other specified disorders of kidney and ureter: Secondary | ICD-10-CM

## 2023-08-28 NOTE — Progress Notes (Unsigned)
Surgical Physician Order Form [facility]  Dr. Vanna Scotland, MD  * Scheduling expectation : Next Available  *Length of Case:   *Clearance needed: no  *Anticoagulation Instructions: Hold all anticoagulants  *Aspirin Instructions: Hold Aspirin  *Post-op visit Date/Instructions:  1 month follow up  *Diagnosis: Right renal mass  *Procedure: right hand-assisted laparoscopic nephrectomy   Additional orders: N/A  -Admit type: INpatient  -Anesthesia: General  -VTE Prophylaxis Standing Order SCD's       Other:   -Standing Lab Orders Per Anesthesia    Lab other: UA/urine culture, CBC, BMP, INR, type and screen  -Standing Test orders EKG/Chest x-ray per Anesthesia       Test other:   - Medications:  Ancef 2gm IV  -Other orders:  N/A

## 2023-08-29 ENCOUNTER — Telehealth: Payer: Self-pay

## 2023-08-29 NOTE — Telephone Encounter (Signed)
  Per Dr. Apolinar Junes, Patient is to be scheduled for Right Hand Assisted Radical Laparoscopic Nephrectomy   Mr. Faupel was contacted and possible surgical dates were discussed, Monday January 13th, 2024 was agreed upon for surgery.     Patient was directed to call [phone] between 1-3pm the day before surgery to find out surgical arrival time.  Instructions were given not to eat or drink from midnight on the night before surgery and have a driver for the day of surgery. On the surgery day patient was instructed to enter through the Medical Mall entrance of [facility] report the Same Day Surgery desk.   Pre-Admit Testing will be in contact via phone to set up an interview with the anesthesia team to review your history and medications prior to surgery.   Reminder of this information was sent via MyChart to the patient.

## 2023-08-29 NOTE — Progress Notes (Signed)
   [facility]- Surgical Posting Form  Surgery Date: Date: 10/16/2023  Surgeon: Dr. Vanna Scotland, MD  Inpt ( Yes  )   Outpt (No)   Obs ( No  )   Diagnosis: N28.89 Right Renal Mass  -CPT: [id number]  Surgery: Right Hand Assisted Radical Laparoscopic Nephrectomy   Stop Anticoagulations: Yes and also hold ASA  Cardiac/Medical/Pulmonary Clearance needed: no  *Orders entered into EPIC  Date: 08/29/23   *Case booked in EPIC  Date: 08/29/23  *Notified pt of Surgery: Date: 08/29/23  PRE-OP UA & CX: yes and will also obtain CBC, BMP, INR, Type and Screen  *Placed into Prior Authorization Work [place] Date: 08/29/23  Assistant/laser/rep:Yes, Dr. Richardo Hanks to Assist

## 2023-09-12 ENCOUNTER — Ambulatory Visit
Admission: RE | Admit: 2023-09-12 | Discharge: 2023-09-12 | Disposition: A | Discharge status: Home / Self Care | Payer: 59 | Source: Ambulatory Visit | Attending: Urology | Admitting: Urology

## 2023-09-12 DIAGNOSIS — N2889 Other specified disorders of kidney and ureter: Secondary | ICD-10-CM

## 2023-09-12 DIAGNOSIS — K449 Diaphragmatic hernia without obstruction or gangrene: Secondary | ICD-10-CM | POA: Diagnosis not present

## 2023-09-20 ENCOUNTER — Ambulatory Visit: Payer: 59

## 2023-09-20 DIAGNOSIS — K5289 Other specified noninfective gastroenteritis and colitis: Secondary | ICD-10-CM | POA: Diagnosis not present

## 2023-09-20 DIAGNOSIS — D122 Benign neoplasm of ascending colon: Secondary | ICD-10-CM | POA: Diagnosis not present

## 2023-09-20 DIAGNOSIS — K5730 Diverticulosis of large intestine without perforation or abscess without bleeding: Secondary | ICD-10-CM | POA: Diagnosis not present

## 2023-09-20 DIAGNOSIS — D124 Benign neoplasm of descending colon: Secondary | ICD-10-CM | POA: Diagnosis not present

## 2023-09-20 DIAGNOSIS — Z860100 Personal history of colon polyps, unspecified: Secondary | ICD-10-CM | POA: Diagnosis not present

## 2023-09-20 DIAGNOSIS — Z09 Encounter for follow-up examination after completed treatment for conditions other than malignant neoplasm: Secondary | ICD-10-CM | POA: Diagnosis not present

## 2023-09-20 DIAGNOSIS — D123 Benign neoplasm of transverse colon: Secondary | ICD-10-CM | POA: Diagnosis not present

## 2023-09-20 DIAGNOSIS — K529 Noninfective gastroenteritis and colitis, unspecified: Secondary | ICD-10-CM | POA: Diagnosis not present

## 2023-09-20 DIAGNOSIS — Z1211 Encounter for screening for malignant neoplasm of colon: Secondary | ICD-10-CM | POA: Diagnosis not present

## 2023-09-30 ENCOUNTER — Other Ambulatory Visit: Payer: Self-pay | Admitting: Urology

## 2023-09-30 DIAGNOSIS — N200 Calculus of kidney: Secondary | ICD-10-CM

## 2023-10-09 ENCOUNTER — Encounter
Admission: RE | Admit: 2023-10-09 | Discharge: 2023-10-09 | Disposition: A | Discharge status: Home / Self Care | Payer: 59 | Source: Ambulatory Visit | Attending: Urology | Admitting: Urology

## 2023-10-09 ENCOUNTER — Other Ambulatory Visit: Payer: Self-pay

## 2023-10-09 HISTORY — DX: Other shoulder lesions, right shoulder: M75.81

## 2023-10-09 HISTORY — DX: Sciatica, right side: M54.31

## 2023-10-09 HISTORY — DX: Postlaminectomy syndrome, not elsewhere classified: M96.1

## 2023-10-09 HISTORY — DX: Intervertebral disc disorders with myelopathy, lumbar region: M51.06

## 2023-10-09 HISTORY — DX: Benign prostatic hyperplasia without lower urinary tract symptoms: N40.0

## 2023-10-09 HISTORY — DX: Chronic kidney disease, unspecified: N18.9

## 2023-10-09 HISTORY — DX: Lesion of ulnar nerve, right upper limb: G56.21

## 2023-10-09 HISTORY — DX: Other intervertebral disc degeneration, lumbar region without mention of lumbar back pain or lower extremity pain: M51.369

## 2023-10-09 HISTORY — DX: Gout, unspecified: M10.9

## 2023-10-09 HISTORY — DX: Unspecified disorder of calcium metabolism: E83.50

## 2023-10-09 HISTORY — DX: Gilbert syndrome: E80.4

## 2023-10-09 HISTORY — DX: Other articular cartilage disorders, right shoulder: M24.111

## 2023-10-09 NOTE — Patient Instructions (Addendum)
 Your procedure is scheduled nw:Fnwijb January 13  Report to the Registration Desk on the 1st floor of the [facility] To find out your arrival time, please call [phone] between 1PM - 3PM on: Friday January 10 If your arrival time is 6:00 am, do not arrive before that time as the Medical Mall entrance doors do not open until 6:00 am.  REMEMBER: Instructions that are not followed completely may result in serious medical risk, up to and including death; or upon the discretion of your surgeon and anesthesiologist your surgery may need to be rescheduled.  Do not eat food after midnight the night before surgery.  No gum chewing or hard candies.  You are to drink clear liquids the ENTIRE day prior to surgery.  One week prior to surgery: Starting Monday January 6 Stop Anti-inflammatories (NSAIDS) such as Advil , Aleve , Ibuprofen , Motrin , Naproxen , Naprosyn  and Aspirin based products such as Excedrin, Goody's Powder, BC Powder. Stop ANY OVER THE COUNTER supplements until after surgery.  You may however, continue to take Tylenol  if needed for pain up until the day of surgery.  Continue taking all of your other prescription medications up until the day of surgery.  ON THE DAY OF SURGERY DO NOT TAKE MEDICATIONS  No Alcohol for 24 hours before or after surgery.  No Smoking including e-cigarettes for 24 hours before surgery.  No chewable tobacco products for at least 6 hours before surgery.  No nicotine patches on the day of surgery.  Do not use any recreational drugs for at least a week (preferably 2 weeks) before your surgery.  Please be advised that the combination of cocaine and anesthesia may have negative outcomes, up to and including death. If you test positive for cocaine, your surgery will be cancelled.  On the morning of surgery brush your teeth with toothpaste and water , you may rinse your mouth with mouthwash if you wish. Do not swallow any toothpaste or mouthwash.  Use  CHG Soap as directed on instruction sheet.  Do not wear jewelry, make-up, hairpins, clips or nail polish.  For welded (permanent) jewelry: bracelets, anklets, waist bands, etc.  Please have this removed prior to surgery.  If it is not removed, there is a chance that hospital personnel will need to cut it off on the day of surgery.  Do not wear lotions, powders, or perfumes.   Do not shave body hair from the neck down 48 hours before surgery.  Contact lenses, hearing aids and dentures may not be worn into surgery.  Do not bring valuables to the hospital. [facility] is not responsible for any missing/lost belongings or valuables.   Notify your doctor if there is any change in your medical condition (cold, fever, infection).  Wear comfortable clothing (specific to your surgery type) to the hospital.  After surgery, you can help prevent lung complications by doing breathing exercises.  Take deep breaths and cough every 1-2 hours  When coughing or sneezing, hold a pillow firmly against your incision with both hands. This is called "splinting." Doing this helps protect your incision. It also decreases belly discomfort.  If you are being admitted to the hospital overnight, leave your suitcase in the car. After surgery it may be brought to your room.  In case of increased patient census, it may be necessary for you, the patient, to continue your postoperative care in the Same Day Surgery department.  If you are being discharged the day of surgery, you will not be allowed to  drive home. You will need a responsible individual to drive you home and stay with you for 24 hours after surgery.   If you are taking public transportation, you will need to have a responsible individual with you.  Please call the Pre-admissions Testing Dept. at [phone] if you have any questions about these instructions.  Surgery Visitation Policy:  Patients having surgery or a procedure may have two visitors.   Children under the age of 37 must have an adult with them who is not the patient.  Inpatient Visitation:    Visiting hours are 7 a.m. to 8 p.m. Up to four visitors are allowed at one time in a patient room. The visitors may rotate out with other people during the day.  One visitor age 31 or older may stay with the patient overnight and must be in the room by 8 p.m.            Preparing for Surgery with CHLORHEXIDINE  GLUCONATE (CHG) Soap  Chlorhexidine  Gluconate (CHG) Soap  o An antiseptic cleaner that kills germs and bonds with the skin to continue killing germs even after washing  o Used for showering the night before surgery and morning of surgery  Before surgery, you can play an important role by reducing the number of germs on your skin.  CHG (Chlorhexidine  gluconate) soap is an antiseptic cleanser which kills germs and bonds with the skin to continue killing germs even after washing.  Please do not use if you have an allergy to CHG or antibacterial soaps. If your skin becomes reddened/irritated stop using the CHG.  1. Shower the NIGHT BEFORE SURGERY and the MORNING OF SURGERY with CHG soap.  2. If you choose to wash your hair, wash your hair first as usual with your normal shampoo.  3. After shampooing, rinse your hair and body thoroughly to remove the shampoo.  4. Use CHG as you would any other liquid soap. You can apply CHG directly to the skin and wash gently with a scrungie or a clean washcloth.  5. Apply the CHG soap to your body only from the neck down. Do not use on open wounds or open sores. Avoid contact with your eyes, ears, mouth, and genitals (private parts). Wash face and genitals (private parts) with your normal soap.  6. Wash thoroughly, paying special attention to the area where your surgery will be performed.  7. Thoroughly rinse your body with warm water .  8. Do not shower/wash with your normal soap after using and rinsing off the CHG soap.  9.  Pat yourself dry with a clean towel.  10. Wear clean pajamas to bed the night before surgery.  12. Place clean sheets on your bed the night of your first shower and do not sleep with pets.  13. Shower again with the CHG soap on the day of surgery prior to arriving at the hospital.  14. Do not apply any deodorants/lotions/powders.  15. Please wear clean clothes to the hospital.

## 2023-10-10 ENCOUNTER — Encounter
Admission: RE | Admit: 2023-10-10 | Discharge: 2023-10-10 | Disposition: A | Discharge status: Home / Self Care | Payer: 59 | Source: Ambulatory Visit | Attending: Urology | Admitting: Urology

## 2023-10-10 DIAGNOSIS — Z01812 Encounter for preprocedural laboratory examination: Secondary | ICD-10-CM | POA: Diagnosis not present

## 2023-10-10 DIAGNOSIS — N2889 Other specified disorders of kidney and ureter: Secondary | ICD-10-CM | POA: Diagnosis not present

## 2023-10-10 LAB — COMPREHENSIVE METABOLIC PANEL
ALT: 41 U/L (ref 0–44)
AST: 27 U/L (ref 15–41)
Albumin: 4.2 g/dL (ref 3.5–5.0)
Alkaline Phosphatase: 76 U/L (ref 38–126)
Anion gap: 12 (ref 5–15)
BUN: 15 mg/dL (ref 8–23)
CO2: 24 mmol/L (ref 22–32)
Calcium: 9.4 mg/dL (ref 8.9–10.3)
Chloride: 103 mmol/L (ref 98–111)
Creatinine, Ser: 1.41 mg/dL — ABNORMAL HIGH (ref 0.61–1.24)
GFR, Estimated: 56 mL/min — ABNORMAL LOW (ref >60)
Glucose, Bld: 105 mg/dL — ABNORMAL HIGH (ref 70–99)
Potassium: 4.2 mmol/L (ref 3.5–5.1)
Sodium: 139 mmol/L (ref 135–145)
Total Bilirubin: 1.9 mg/dL — ABNORMAL HIGH (ref 0.0–1.2)
Total Protein: 8.1 g/dL (ref 6.5–8.1)

## 2023-10-10 LAB — URINALYSIS, COMPLETE (UACMP) WITH MICROSCOPIC
Bacteria, UA: NONE SEEN
Bilirubin Urine: NEGATIVE
Glucose, UA: NEGATIVE mg/dL
Hgb urine dipstick: NEGATIVE
Ketones, ur: NEGATIVE mg/dL
Leukocytes,Ua: NEGATIVE
Nitrite: NEGATIVE
Protein, ur: NEGATIVE mg/dL
Specific Gravity, Urine: 1.024 (ref 1.005–1.030)
pH: 5 (ref 5.0–8.0)

## 2023-10-10 LAB — PROTIME-INR
INR: 1.1 (ref 0.8–1.2)
Prothrombin Time: 14.2 s (ref 11.4–15.2)

## 2023-10-10 LAB — CBC
HCT: 51.2 % (ref 39.0–52.0)
Hemoglobin: 16.7 g/dL (ref 13.0–17.0)
MCH: 28.2 pg (ref 26.0–34.0)
MCHC: 32.6 g/dL (ref 30.0–36.0)
MCV: 86.3 fL (ref 80.0–100.0)
Platelets: 300 10*3/uL (ref 150–400)
RBC: 5.93 MIL/uL — ABNORMAL HIGH (ref 4.22–5.81)
RDW: 15.1 % (ref 11.5–15.5)
WBC: 9.2 10*3/uL (ref 4.0–10.5)
nRBC: 0 % (ref 0.0–0.2)

## 2023-10-11 LAB — URINE CULTURE: Culture: NO GROWTH

## 2023-10-14 LAB — TYPE AND SCREEN
ABO/RH(D): O NEG
Antibody Screen: NEGATIVE

## 2023-10-16 ENCOUNTER — Inpatient Hospital Stay
Admission: RE | Admit: 2023-10-16 | Discharge: 2023-10-18 | DRG: 658 | Disposition: A | Discharge status: Home / Self Care | Payer: 59 | Attending: Urology | Admitting: Urology

## 2023-10-16 ENCOUNTER — Other Ambulatory Visit: Payer: Self-pay

## 2023-10-16 ENCOUNTER — Inpatient Hospital Stay: Payer: 59 | Admitting: Urgent Care

## 2023-10-16 ENCOUNTER — Encounter: Payer: Self-pay | Admitting: Urology

## 2023-10-16 ENCOUNTER — Encounter
Admission: RE | Disposition: A | Discharge status: Home / Self Care | Payer: Self-pay | Source: Home / Self Care | Attending: Urology

## 2023-10-16 ENCOUNTER — Inpatient Hospital Stay: Payer: 59 | Admitting: Anesthesiology

## 2023-10-16 DIAGNOSIS — K660 Peritoneal adhesions (postprocedural) (postinfection): Secondary | ICD-10-CM | POA: Diagnosis present

## 2023-10-16 DIAGNOSIS — N1831 Chronic kidney disease, stage 3a: Secondary | ICD-10-CM | POA: Diagnosis present

## 2023-10-16 DIAGNOSIS — N200 Calculus of kidney: Secondary | ICD-10-CM | POA: Diagnosis present

## 2023-10-16 DIAGNOSIS — N281 Cyst of kidney, acquired: Secondary | ICD-10-CM | POA: Diagnosis present

## 2023-10-16 DIAGNOSIS — E785 Hyperlipidemia, unspecified: Secondary | ICD-10-CM | POA: Diagnosis present

## 2023-10-16 DIAGNOSIS — Z87442 Personal history of urinary calculi: Secondary | ICD-10-CM | POA: Diagnosis not present

## 2023-10-16 DIAGNOSIS — E66811 Obesity, class 1: Secondary | ICD-10-CM | POA: Diagnosis present

## 2023-10-16 DIAGNOSIS — Z87891 Personal history of nicotine dependence: Secondary | ICD-10-CM | POA: Diagnosis not present

## 2023-10-16 DIAGNOSIS — Z9049 Acquired absence of other specified parts of digestive tract: Secondary | ICD-10-CM

## 2023-10-16 DIAGNOSIS — Z6830 Body mass index (BMI) 30.0-30.9, adult: Secondary | ICD-10-CM | POA: Diagnosis not present

## 2023-10-16 DIAGNOSIS — N2889 Other specified disorders of kidney and ureter: Principal | ICD-10-CM | POA: Diagnosis present

## 2023-10-16 DIAGNOSIS — Z981 Arthrodesis status: Secondary | ICD-10-CM | POA: Diagnosis not present

## 2023-10-16 DIAGNOSIS — I129 Hypertensive chronic kidney disease with stage 1 through stage 4 chronic kidney disease, or unspecified chronic kidney disease: Secondary | ICD-10-CM | POA: Diagnosis present

## 2023-10-16 DIAGNOSIS — Z8249 Family history of ischemic heart disease and other diseases of the circulatory system: Secondary | ICD-10-CM

## 2023-10-16 DIAGNOSIS — C641 Malignant neoplasm of right kidney, except renal pelvis: Secondary | ICD-10-CM | POA: Diagnosis not present

## 2023-10-16 HISTORY — PX: LAPAROSCOPIC NEPHRECTOMY, HAND ASSISTED: SHX1929

## 2023-10-16 LAB — ABO/RH: ABO/RH(D): O NEG

## 2023-10-16 SURGERY — NEPHRECTOMY, HAND-ASSISTED, LAPAROSCOPIC
Anesthesia: General | Site: Abdomen | Laterality: Right

## 2023-10-16 MED ORDER — LOSARTAN POTASSIUM 50 MG PO TABS
50.0000 mg | ORAL_TABLET | Freq: Every day | ORAL | Status: DC
  Administered 2023-10-16 – 2023-10-18 (×3): 50 mg via ORAL
  Filled 2023-10-16 (×3): qty 1

## 2023-10-16 MED ORDER — ROCURONIUM BROMIDE 10 MG/ML (PF) SYRINGE
PREFILLED_SYRINGE | INTRAVENOUS | Status: AC
  Filled 2023-10-16: qty 10

## 2023-10-16 MED ORDER — HYDROMORPHONE HCL 1 MG/ML IJ SOLN
INTRAMUSCULAR | Status: AC
  Filled 2023-10-16: qty 1

## 2023-10-16 MED ORDER — CHLORHEXIDINE GLUCONATE 0.12 % MT SOLN
15.0000 mL | Freq: Once | OROMUCOSAL | Status: AC
  Administered 2023-10-16: 15 mL via OROMUCOSAL

## 2023-10-16 MED ORDER — ACETAMINOPHEN 10 MG/ML IV SOLN
INTRAVENOUS | Status: DC | PRN
  Administered 2023-10-16: 1000 mg via INTRAVENOUS

## 2023-10-16 MED ORDER — CEFAZOLIN SODIUM-DEXTROSE 1-4 GM/50ML-% IV SOLN
1.0000 g | Freq: Three times a day (TID) | INTRAVENOUS | Status: AC
Start: 2023-10-16 — End: 2023-10-17
  Administered 2023-10-16 – 2023-10-17 (×2): 1 g via INTRAVENOUS
  Filled 2023-10-16 (×2): qty 50

## 2023-10-16 MED ORDER — LIDOCAINE HCL (CARDIAC) PF 100 MG/5ML IV SOSY
PREFILLED_SYRINGE | INTRAVENOUS | Status: DC | PRN
  Administered 2023-10-16: 100 mg via INTRAVENOUS

## 2023-10-16 MED ORDER — REMIFENTANIL HCL 1 MG IV SOLR
INTRAVENOUS | Status: AC
  Filled 2023-10-16: qty 1000

## 2023-10-16 MED ORDER — ROCURONIUM BROMIDE 100 MG/10ML IV SOLN
INTRAVENOUS | Status: DC | PRN
  Administered 2023-10-16: 10 mg via INTRAVENOUS
  Administered 2023-10-16 (×2): 20 mg via INTRAVENOUS
  Administered 2023-10-16: 50 mg via INTRAVENOUS

## 2023-10-16 MED ORDER — EPHEDRINE SULFATE-NACL 50-0.9 MG/10ML-% IV SOSY
PREFILLED_SYRINGE | INTRAVENOUS | Status: DC | PRN
  Administered 2023-10-16: 5 mg via INTRAVENOUS
  Administered 2023-10-16 (×2): 10 mg via INTRAVENOUS

## 2023-10-16 MED ORDER — ORAL CARE MOUTH RINSE: 15.0000 mL | Freq: Once | OROMUCOSAL | Status: AC

## 2023-10-16 MED ORDER — CEFAZOLIN SODIUM-DEXTROSE 2-4 GM/100ML-% IV SOLN
2.0000 g | INTRAVENOUS | Status: AC
  Administered 2023-10-16: 2 g via INTRAVENOUS

## 2023-10-16 MED ORDER — OXYBUTYNIN CHLORIDE 5 MG PO TABS: 5.0000 mg | ORAL_TABLET | Freq: Three times a day (TID) | ORAL | Status: DC | PRN

## 2023-10-16 MED ORDER — SUGAMMADEX SODIUM 200 MG/2ML IV SOLN
INTRAVENOUS | Status: DC | PRN
  Administered 2023-10-16: 200 mg via INTRAVENOUS

## 2023-10-16 MED ORDER — HYDROMORPHONE HCL 1 MG/ML IJ SOLN
INTRAMUSCULAR | Status: DC | PRN
  Administered 2023-10-16: .5 mg via INTRAVENOUS

## 2023-10-16 MED ORDER — HEPARIN SODIUM (PORCINE) 5000 UNIT/ML IJ SOLN
5000.0000 [IU] | Freq: Three times a day (TID) | INTRAMUSCULAR | Status: DC
  Administered 2023-10-16 – 2023-10-18 (×6): 5000 [IU] via SUBCUTANEOUS
  Filled 2023-10-16 (×5): qty 1

## 2023-10-16 MED ORDER — STERILE WATER FOR IRRIGATION IR SOLN
Status: DC | PRN
  Administered 2023-10-16: 500 mL

## 2023-10-16 MED ORDER — KETAMINE HCL 50 MG/5ML IJ SOSY
PREFILLED_SYRINGE | INTRAMUSCULAR | Status: AC
  Filled 2023-10-16: qty 5

## 2023-10-16 MED ORDER — CHLORHEXIDINE GLUCONATE 4 % EX SOLN
60.0000 mL | Freq: Once | CUTANEOUS | Status: AC
  Administered 2023-10-16: 4 via TOPICAL

## 2023-10-16 MED ORDER — BUPIVACAINE HCL (PF) 0.5 % IJ SOLN
INTRAMUSCULAR | Status: AC
  Filled 2023-10-16: qty 30

## 2023-10-16 MED ORDER — PROPOFOL 10 MG/ML IV BOLUS
INTRAVENOUS | Status: AC
  Filled 2023-10-16: qty 20

## 2023-10-16 MED ORDER — DOCUSATE SODIUM 100 MG PO CAPS
100.0000 mg | ORAL_CAPSULE | Freq: Two times a day (BID) | ORAL | Status: DC
  Administered 2023-10-16 – 2023-10-18 (×4): 100 mg via ORAL
  Filled 2023-10-16 (×4): qty 1

## 2023-10-16 MED ORDER — CHLORHEXIDINE GLUCONATE 0.12 % MT SOLN
OROMUCOSAL | Status: AC
  Filled 2023-10-16: qty 15

## 2023-10-16 MED ORDER — OXYCODONE HCL 5 MG PO TABS
5.0000 mg | ORAL_TABLET | Freq: Once | ORAL | Status: AC | PRN
  Administered 2023-10-16: 5 mg via ORAL

## 2023-10-16 MED ORDER — OXYCODONE-ACETAMINOPHEN 5-325 MG PO TABS
1.0000 | ORAL_TABLET | ORAL | Status: DC | PRN
  Administered 2023-10-16 (×2): 2 via ORAL
  Administered 2023-10-17: 1 via ORAL
  Administered 2023-10-17 – 2023-10-18 (×5): 2 via ORAL
  Filled 2023-10-16 (×7): qty 2

## 2023-10-16 MED ORDER — REMIFENTANIL HCL 2 MG IV SOLR
INTRAVENOUS | Status: DC | PRN
  Administered 2023-10-16: .2 ug/kg/min via INTRAVENOUS

## 2023-10-16 MED ORDER — LIDOCAINE HCL (PF) 2 % IJ SOLN
INTRAMUSCULAR | Status: AC
  Filled 2023-10-16: qty 5

## 2023-10-16 MED ORDER — ZOLPIDEM TARTRATE 5 MG PO TABS
5.0000 mg | ORAL_TABLET | Freq: Every evening | ORAL | Status: DC | PRN
  Administered 2023-10-16 – 2023-10-17 (×2): 5 mg via ORAL
  Filled 2023-10-16 (×2): qty 1

## 2023-10-16 MED ORDER — OXYCODONE HCL 5 MG/5ML PO SOLN
5.0000 mg | Freq: Once | ORAL | Status: AC | PRN
Start: 2023-10-16 — End: 2023-10-16

## 2023-10-16 MED ORDER — HEPARIN SODIUM (PORCINE) 5000 UNIT/ML IJ SOLN
INTRAMUSCULAR | Status: AC
  Filled 2023-10-16: qty 1

## 2023-10-16 MED ORDER — ALLOPURINOL 100 MG PO TABS
200.0000 mg | ORAL_TABLET | Freq: Every day | ORAL | Status: DC
  Administered 2023-10-16 – 2023-10-17 (×2): 200 mg via ORAL
  Filled 2023-10-16 (×2): qty 2

## 2023-10-16 MED ORDER — TAMSULOSIN HCL 0.4 MG PO CAPS
0.4000 mg | ORAL_CAPSULE | Freq: Every day | ORAL | Status: DC
  Administered 2023-10-16 – 2023-10-17 (×2): 0.4 mg via ORAL
  Filled 2023-10-16 (×2): qty 1

## 2023-10-16 MED ORDER — BUPIVACAINE LIPOSOME 1.3 % IJ SUSP
INTRAMUSCULAR | Status: AC
  Filled 2023-10-16: qty 20

## 2023-10-16 MED ORDER — FENTANYL CITRATE (PF) 100 MCG/2ML IJ SOLN
INTRAMUSCULAR | Status: AC
  Filled 2023-10-16: qty 2

## 2023-10-16 MED ORDER — ACETAMINOPHEN 10 MG/ML IV SOLN
INTRAVENOUS | Status: AC
  Filled 2023-10-16: qty 100

## 2023-10-16 MED ORDER — OXYCODONE-ACETAMINOPHEN 5-325 MG PO TABS
ORAL_TABLET | ORAL | Status: AC
  Filled 2023-10-16: qty 2

## 2023-10-16 MED ORDER — MORPHINE SULFATE (PF) 2 MG/ML IV SOLN
2.0000 mg | INTRAVENOUS | Status: DC | PRN
Start: 2023-10-16 — End: 2023-10-18
  Administered 2023-10-17 (×4): 2 mg via INTRAVENOUS
  Filled 2023-10-16 (×4): qty 1

## 2023-10-16 MED ORDER — PHENYLEPHRINE 80 MCG/ML (10ML) SYRINGE FOR IV PUSH (FOR BLOOD PRESSURE SUPPORT)
PREFILLED_SYRINGE | INTRAVENOUS | Status: DC | PRN
  Administered 2023-10-16 (×4): 80 ug via INTRAVENOUS

## 2023-10-16 MED ORDER — PHENYLEPHRINE HCL-NACL 20-0.9 MG/250ML-% IV SOLN
INTRAVENOUS | Status: AC
  Filled 2023-10-16: qty 250

## 2023-10-16 MED ORDER — KETAMINE HCL 10 MG/ML IJ SOLN
INTRAMUSCULAR | Status: DC | PRN
  Administered 2023-10-16: 50 mg via INTRAVENOUS

## 2023-10-16 MED ORDER — BUPIVACAINE LIPOSOME 1.3 % IJ SUSP
INTRAMUSCULAR | Status: DC | PRN
  Administered 2023-10-16: 50 mL

## 2023-10-16 MED ORDER — ONDANSETRON HCL 4 MG/2ML IJ SOLN
INTRAMUSCULAR | Status: AC
  Filled 2023-10-16: qty 2

## 2023-10-16 MED ORDER — ROSUVASTATIN CALCIUM 10 MG PO TABS
40.0000 mg | ORAL_TABLET | Freq: Every day | ORAL | Status: DC
  Administered 2023-10-17 – 2023-10-18 (×2): 40 mg via ORAL
  Filled 2023-10-16 (×2): qty 4

## 2023-10-16 MED ORDER — GLYCOPYRROLATE 0.2 MG/ML IJ SOLN
INTRAMUSCULAR | Status: DC | PRN
  Administered 2023-10-16: .2 mg via INTRAVENOUS

## 2023-10-16 MED ORDER — SODIUM CHLORIDE 0.9 % IV SOLN: INTRAVENOUS | Status: AC

## 2023-10-16 MED ORDER — ONDANSETRON HCL 4 MG/2ML IJ SOLN
4.0000 mg | INTRAMUSCULAR | Status: DC | PRN
  Administered 2023-10-16 – 2023-10-17 (×4): 4 mg via INTRAVENOUS
  Filled 2023-10-16 (×4): qty 2

## 2023-10-16 MED ORDER — DEXAMETHASONE SODIUM PHOSPHATE 10 MG/ML IJ SOLN
INTRAMUSCULAR | Status: DC | PRN
  Administered 2023-10-16: 4 mg via INTRAVENOUS

## 2023-10-16 MED ORDER — CEFAZOLIN SODIUM-DEXTROSE 2-4 GM/100ML-% IV SOLN
INTRAVENOUS | Status: AC
  Filled 2023-10-16: qty 100

## 2023-10-16 MED ORDER — PROPOFOL 1000 MG/100ML IV EMUL
INTRAVENOUS | Status: AC
  Filled 2023-10-16: qty 100

## 2023-10-16 MED ORDER — LACTATED RINGERS IV SOLN: INTRAVENOUS | Status: DC

## 2023-10-16 MED ORDER — HYDROMORPHONE HCL 1 MG/ML IJ SOLN
0.2500 mg | INTRAMUSCULAR | Status: DC | PRN
  Administered 2023-10-16 (×4): 0.5 mg via INTRAVENOUS

## 2023-10-16 MED ORDER — ONDANSETRON HCL 4 MG/2ML IJ SOLN
INTRAMUSCULAR | Status: DC | PRN
  Administered 2023-10-16: 4 mg via INTRAVENOUS

## 2023-10-16 MED ORDER — PROPOFOL 1000 MG/100ML IV EMUL
INTRAVENOUS | Status: AC
Start: 2023-10-16 — End: ?
  Filled 2023-10-16: qty 100

## 2023-10-16 MED ORDER — SODIUM CHLORIDE 0.9 % IV SOLN: INTRAVENOUS | Status: DC | PRN

## 2023-10-16 MED ORDER — SURGIFLO WITH THROMBIN (HEMOSTATIC MATRIX KIT) OPTIME
TOPICAL | Status: DC | PRN
  Administered 2023-10-16: 1 via TOPICAL

## 2023-10-16 MED ORDER — MIDAZOLAM HCL 2 MG/2ML IJ SOLN
INTRAMUSCULAR | Status: AC
Start: 2023-10-16 — End: ?
  Filled 2023-10-16: qty 2

## 2023-10-16 MED ORDER — PROPOFOL 10 MG/ML IV BOLUS
INTRAVENOUS | Status: DC | PRN
  Administered 2023-10-16: 200 mg via INTRAVENOUS
  Administered 2023-10-16: 50 mg via INTRAVENOUS

## 2023-10-16 MED ORDER — ACETAMINOPHEN 325 MG PO TABS: 650.0000 mg | ORAL_TABLET | ORAL | Status: DC | PRN

## 2023-10-16 MED ORDER — MIDAZOLAM HCL 2 MG/2ML IJ SOLN
INTRAMUSCULAR | Status: DC | PRN
  Administered 2023-10-16: 2 mg via INTRAVENOUS

## 2023-10-16 MED ORDER — FENTANYL CITRATE (PF) 100 MCG/2ML IJ SOLN
INTRAMUSCULAR | Status: DC | PRN
  Administered 2023-10-16: 100 ug via INTRAVENOUS

## 2023-10-16 MED ORDER — OXYCODONE HCL 5 MG PO TABS
ORAL_TABLET | ORAL | Status: AC
  Filled 2023-10-16: qty 1

## 2023-10-16 SURGICAL SUPPLY — 77 items
APPLICATOR SURGIFLO ENDO (HEMOSTASIS) IMPLANT
APPLIER CLIP ROT 10 11.4 M/L (STAPLE)
APPLIER CLIP ROT 13.4 12 LRG (CLIP)
BAG LAPAROSCOPIC 12 15 PORT 16 (BASKET) ×1 IMPLANT
BAG RETRIEVAL 12/15 (BASKET) ×1
CHLORAPREP W/TINT 26 (MISCELLANEOUS) ×1 IMPLANT
CLEANER CAUTERY TIP PAD (MISCELLANEOUS) ×1 IMPLANT
CLIP APPLIE ROT 10 11.4 M/L (STAPLE) IMPLANT
CLIP APPLIE ROT 13.4 12 LRG (CLIP) IMPLANT
CLIP LIGATING HEM O LOK PURPLE (MISCELLANEOUS) ×1 IMPLANT
CUTTER ECHEON FLEX ENDO 45 340 (ENDOMECHANICALS) IMPLANT
DERMABOND ADVANCED .7 DNX12 (GAUZE/BANDAGES/DRESSINGS) ×2 IMPLANT
DISSECTOR BLUNT TIP ENDO 5MM (MISCELLANEOUS) ×1 IMPLANT
DRAPE INCISE IOBAN 66X45 STRL (DRAPES) ×1 IMPLANT
DRAPE STERI POUCH LG 24X46 STR (DRAPES) ×1 IMPLANT
DRAPE SURG 17X11 SM STRL (DRAPES) ×4 IMPLANT
DRSG TEGADERM 2-3/8X2-3/4 SM (GAUZE/BANDAGES/DRESSINGS) IMPLANT
DRSG TEGADERM 4X4.75 (GAUZE/BANDAGES/DRESSINGS) IMPLANT
DRSG TELFA 3X8 NADH STRL (GAUZE/BANDAGES/DRESSINGS) IMPLANT
ELECT REM PT RETURN 9FT ADLT (ELECTROSURGICAL) ×1
ELECTRODE REM PT RTRN 9FT ADLT (ELECTROSURGICAL) ×1 IMPLANT
GLOVE BIO SURGEON STRL SZ 6.5 (GLOVE) ×2 IMPLANT
GLOVE SURG UNDER LTX SZ6.5 (GLOVE) ×1 IMPLANT
GOWN STRL REUS W/ TWL LRG LVL3 (GOWN DISPOSABLE) ×3 IMPLANT
GRASPER SUT TROCAR 14GX15 (MISCELLANEOUS) ×1 IMPLANT
HANDLE YANKAUER SUCT BULB TIP (MISCELLANEOUS) ×1 IMPLANT
HEMOSTAT SURGICEL 2X14 (HEMOSTASIS) IMPLANT
HOLDER FOLEY CATH W/STRAP (MISCELLANEOUS) ×1 IMPLANT
IRRIGATION STRYKERFLOW (MISCELLANEOUS) ×1 IMPLANT
IRRIGATOR STRYKERFLOW (MISCELLANEOUS) ×1
KIT PINK PAD W/HEAD ARE REST (MISCELLANEOUS) ×1
KIT PINK PAD W/HEAD ARM REST (MISCELLANEOUS) ×1 IMPLANT
KIT TURNOVER KIT A (KITS) ×1 IMPLANT
L-HOOK LAP DISP 36CM (ELECTROSURGICAL) ×1
LABEL OR SOLS (LABEL) ×1 IMPLANT
LHOOK LAP DISP 36CM (ELECTROSURGICAL) ×1 IMPLANT
LIGASURE LAP ATLAS 10MM 37CM (INSTRUMENTS) ×1 IMPLANT
LOOP VESSEL MAXI 1X406 RED (MISCELLANEOUS) ×1 IMPLANT
MANIFOLD NEPTUNE II (INSTRUMENTS) ×1 IMPLANT
NDL HYPO 21X1.5 SAFETY (NEEDLE) ×1 IMPLANT
NEEDLE HYPO 21X1.5 SAFETY (NEEDLE) ×1 IMPLANT
PACK LAP CHOLECYSTECTOMY (MISCELLANEOUS) ×1 IMPLANT
RELOAD STAPLE 35X2.5 WHT THIN (STAPLE) IMPLANT
RELOAD STAPLE 45 2.6 WHT THIN (STAPLE) IMPLANT
RELOAD STAPLE 60 2.6 WHT THN (STAPLE) IMPLANT
RELOAD STAPLER WHITE 60MM (STAPLE) ×3 IMPLANT
SCISSORS METZENBAUM CVD 33 (INSTRUMENTS) ×1 IMPLANT
SET TUBE SMOKE EVAC HIGH FLOW (TUBING) ×1 IMPLANT
SPONGE T-LAP 18X18 ~~LOC~~+RFID (SPONGE) ×1 IMPLANT
STAPLE ECHEON FLEX 60 POW ENDO (STAPLE) IMPLANT
STAPLE RELOAD 2.5MM WHITE (STAPLE) IMPLANT
STAPLE RELOAD 45 WHT (STAPLE) IMPLANT
STAPLER RELOAD WHITE 60MM (STAPLE) ×3
STAPLER SKIN PROX 35W (STAPLE) ×1 IMPLANT
STAPLER VASCULAR ECHELON 35 (CUTTER) IMPLANT
STRIP CLOSURE SKIN 1/2X4 (GAUZE/BANDAGES/DRESSINGS) IMPLANT
SURGIFLO W/THROMBIN 8M KIT (HEMOSTASIS) IMPLANT
SUT MNCRL AB 4-0 PS2 18 (SUTURE) ×2 IMPLANT
SUT PDS AB 0 CT1 27 (SUTURE) IMPLANT
SUT PDS AB 1 CT1 36 (SUTURE) IMPLANT
SUT PDS AB 1 TP1 96 (SUTURE) ×1 IMPLANT
SUT VIC AB 0 CT1 36 (SUTURE) ×2 IMPLANT
SUT VIC AB 1 CT1 36 (SUTURE) IMPLANT
SUT VIC AB 2-0 SH 27XBRD (SUTURE) IMPLANT
SUT VIC AB 4-0 FS2 27 (SUTURE) ×1 IMPLANT
SUT VICRYL 0 UR6 27IN ABS (SUTURE) ×1 IMPLANT
SYS LAPSCP GELPORT 120MM (MISCELLANEOUS) ×2
SYSTEM LAPSCP GELPORT 120MM (MISCELLANEOUS) ×1 IMPLANT
SYSTEM TISSUE EXTR PNEUMOLINER (MISCELLANEOUS) IMPLANT
TISSUE EXTRACTION PNEUMOLINER (MISCELLANEOUS) ×1
TRAP FLUID SMOKE EVACUATOR (MISCELLANEOUS) ×1 IMPLANT
TRAY FOLEY MTR SLVR 16FR STAT (SET/KITS/TRAYS/PACK) ×1 IMPLANT
TROCAR ENDOPATH XCEL 12X100 BL (ENDOMECHANICALS) ×1 IMPLANT
TROCAR Z-THREAD FIOS 12X100MM (TROCAR) ×1 IMPLANT
TROCAR Z-THREAD FIOS 5X100MM (TROCAR) IMPLANT
WATER STERILE IRR 3000ML UROMA (IV SOLUTION) ×1 IMPLANT
WATER STERILE IRR 500ML POUR (IV SOLUTION) ×1 IMPLANT

## 2023-10-16 NOTE — Transfer of Care (Signed)
 Immediate Anesthesia Transfer of Care Note  Patient: John [name]  Procedure(s) Performed: HAND ASSISTED LAPAROSCOPIC RADICAL NEPHRECTOMY (Right: Abdomen)  Patient Location: PACU  Anesthesia Type:General  Level of Consciousness: responds to stimulation  Airway & Oxygen Therapy: Patient Spontanous Breathing and Patient connected to face mask oxygen  Post-op Assessment: Report given to RN and Post -op Vital signs reviewed and stable  Post vital signs: stable  Last Vitals:  Vitals Value Taken Time  BP 146/80 10/16/23 1322  Temp 97.3   Pulse 106 10/16/23 1327  Resp 15 10/16/23 1327  SpO2 98 % 10/16/23 1327  Vitals shown include unfiled device data.  Last Pain:  Vitals:   10/16/23 0927  TempSrc: Temporal  PainSc: 0-No pain         Complications: No notable events documented.

## 2023-10-16 NOTE — Op Note (Signed)
 10/16/23    PREOP DIAGNOSIS: right renal mass   POSTOPERATIVE DIAGNOSIS: right renal mass  OPERATION PERFORMED: Hand-assisted laparoscopic right radical nephrectomy.  SURGEON: Rosina Riis, MD  Assistant: Redell Burnet, MD  ANESTHESIA: General.   ESTIMATED BLOOD LOSS: 150  cc   DRAINS: 16-French Foley catheter   COMPLICATIONS: None.  Indications: None  FINDINGS: Very large right kidney.  Liver adhesions noted.  Otherwise uncomplicated nephrectomy.   DESCRIPTION OF OPERATION: Informed consent was obtained. The patient was marked on the right side. IV antibiotics were given for bacterial prophylaxis on call to the operating room. SCDs were provided for DVT prophylaxis. The patient was taken to the operating room and placed supine on the operating table. General anesthesia was provided. A Foley catheter was placed to drain the bladder.    The patient was positioned in right lateral decubitus with the right flank elevated about 70 degrees and the table flexed slightly. The right arm was placed in a padded airplane for support. Axillary roll was positioned. The patient was secured to the table with soft straps and then prepped and draped sterilely.   We had a time-out confirming the patient identification, planned procedure, surgical site, and all present were in agreement. All present were in agreement.   A 8 cm incision was made for the hand port in the right lower quadrant. The anterior fascia was incised and elevated. The rectus belly was retracted medially and the peritoneum was incised. An incisional block was provided with liposomal Marcaine . The GelPort was assembled. A 12 mm trocar was placed above and slightly lateral to the umbilicus and then the abdomen was insufflated. Laparoscopic survey revealed no abnormalities or injuries. An additional 12 mm trocar was placed superior to the first trocar in the right upper quadrant. All port sites were  then infiltrated with liposomal Marcaine . Zero Vicryls were placed at the 12 mm trocar sites with the Carter-Thomason device for closure at the end of the case.   A few abdominal wall adhesions were taken down sharply.  The white line of Toldt was incised. The colon was reflected from the spleen to the pelvis. Gerota's fascia was lifted up off the lower pole of the kidney and the ureter and gonadal vessels were identified. The gonadal vein was exposed.  In dissecting the structures, came across and bleeding ended up ligating both the gonadal and ureter distally.  The duodenum was identified and kocherized medially.  The upper pole of the kidney was mobilized off of the quadratus lumborum and further mobilized away from the liver .The adrenal gland was identified and spared during the upper pole dissection. The lower pole and lateral attachments were freed. The kidney was held laterally and the hilar dissection was completed such that the entire hilum could be encircled digitally en bloc.  At this point, the hilum was divided with a 60 mm vascular load staple using the battery operated endovascular stapler en bloc.  Another staple fire was used in the medial upper quadrant and remainder of the kidney was freed x 2.  An endocatch bag was then used to bag the specimen.  Unfortunately, due to the very large size of the kidney, I could not fit into the first bag we ended up using a GYN morcellator bag.  The kidney was extracted through the gel port and passed off for pathological analysis.  Notably, due to the large size of the kidney, we did have to extend this incision quite a bit laterally.  Pneumoperitoneal pressure was reduced to 7 mmHg and the abdomen was inspected; hemostasis was confirmed.  There was some mild bleeding noted on the edge of the adrenal gland which was fulgurated using the LigaSure.  I also used both Surgicel and Surgiflo flow on the bed of the liver where any oozing was noted.   The 12 mm trocars were removed and the port sites closed with previously placed 0 Vicryl. The Gelport was removed. The anterior fascia was closed with series of #1 PDS in an interrupted figure-of-eight fashion.  The subcutaneous tissue was closed using 0-0 vicryl. All the incisions were irrigated, patted dry, and then the skin was reapproximated with 4-0 Monocryl in a subcuticular fashion. The wounds were cleaned and dried and covered with Dermabond. All sponge, needle, and instrument counts were reported correct x2.   The patient was awakened from anesthesia and transferred to recovery in stable condition. There were no complications. The patient tolerated the procedure well.   An assistant was required for this surgical procedure. The duties of the assistant included but were not limited to suctioning, passing suture, camera manipulation, retraction. This procedure would not be able to be performed without an assistant.   ______________________________    Rosina Riis, MD       An assistant was required for this surgical procedure. The duties of the assistant included but were not limited to suctioning, passing suture, camera manipulation, retraction. This procedure would not be able to be performed without an geophysicist/field seismologist.

## 2023-10-16 NOTE — H&P (Signed)
 10/16/23  RRR CTAB  John [name] [birth year] [id number]   Referring provider: Corlis Honor BROCKS, MD [address]                   [place],  [place] [address]      Chief Complaint  Patient presents with   Renal Mass      HPI:   63 year-old male with an incidental renal mass that was initially indeterminate.    He had an acute PA visit here 06/09/2023 with complaints of right flank pain and gross hematuria for 1 day; KUB showed a calcification in the vicinity of the right distal ureter.   In follow-up, he was having persistent pain and a renal stone CT was ordered, which was performed on 07/04/2023. This did show bilateral non-obstructing renal calculi and multiple bilateral renal cysts.    He follows up today with an MRI that shows a right lower pole mass measuring 5.0 by 3.7 cm. This was read as a Bosniak 4 with cystic and solid components. A second mass within the right renal sinus measuring 1.9 by 1.7 cm was also visualized.   This been increasing in size since 2019 when first identified (~ 2 cm).   He is followed by Dr. Marcelino for CKD.  His baseline creatinine is 1.5.   He was previously seen by Dr. Twylla. He is here to discuss his surgical options today.      PMH:     Past Medical History:  Diagnosis Date   History of kidney stones     Hyperlipidemia     Hypertension            Surgical History:      Past Surgical History:  Procedure Laterality Date   BACK SURGERY       CHOLECYSTECTOMY       CYSTOSCOPY WITH LITHOLAPAXY N/A 12/18/2018    Procedure: CYSTOSCOPY WITH LITHOLAPAXY;  Surgeon: Twylla Glendia BROCKS, MD;  Location: [facility];  Service: Urology;  Laterality: N/A;   CYSTOSCOPY/URETEROSCOPY/HOLMIUM LASER/STENT PLACEMENT Left 10/12/2017    Procedure: CYSTOSCOPY/URETEROSCOPY/HOLMIUM LASER/STENT PLACEMENT;  Surgeon: Twylla Glendia BROCKS, MD;  Location: [facility];  Service: Urology;  Laterality: Left;   LUMBAR FUSION        L5S1   SHOULDER ARTHROSCOPY WITH SUBACROMIAL  DECOMPRESSION Right 10/27/2016    Procedure: SHOULDER ARTHROSCOPY WITH SUBACROMIAL DECOMPRESSION  AND DEBRIDEMENT;  Surgeon: Norleen JINNY Maltos, MD;  Location: [facility];  Service: Orthopedics;  Laterality: Right;   SHOULDER SURGERY Right 2003          Home Medications:  Allergies as of 08/25/2023   No Known Allergies         Medication List           Accurate as of August 25, 2023  9:56 AM. If you have any questions, ask your nurse or doctor.              allopurinol  100 MG tablet Commonly known as: ZYLOPRIM  Take 2 tablets (200 mg total) by mouth at bedtime.    hyoscyamine 0.125 MG SL tablet Commonly known as: LEVSIN SL Place under the tongue. Place 1 tablet (0.125 mg total) under the tongue every 6 (six) hours as needed for Cramping    losartan  50 MG tablet Commonly known as: COZAAR  Take 50 mg by mouth daily.    rosuvastatin  40 MG tablet Commonly known as: CRESTOR  Take 1 tablet (40 mg total) by mouth daily.  tamsulosin  0.4 MG Caps capsule Commonly known as: FLOMAX  Take 2 capsules (0.8 mg total) by mouth daily.               Family History:      Family History  Problem Relation Age of Onset   Heart disease Father     Prostate cancer Neg Hx     Bladder Cancer Neg Hx     Kidney cancer Neg Hx            Social History:  reports that he has never smoked. He has quit using smokeless tobacco.  His smokeless tobacco use included snuff. He reports that he does not drink alcohol and does not use drugs.     Physical Exam: BP 125/79 (BP Location: Left Arm, Patient Position: Sitting, Cuff Size: Large)   Pulse 88   Ht 6' (1.829 m)   Wt 225 lb 6.4 oz (102.2 kg)   BMI 30.57 kg/m   Constitutional:  Alert and oriented, No acute distress.  Accompanied by his wife today. HEENT: [place] AT, moist mucus membranes.  Trachea midline, no masses. Neurologic: Grossly intact, no focal deficits, moving all 4 extremities. Abdomen: Soft nontender no scarring  appreciated. Psychiatric: Normal mood and affect.     Pertinent Imaging: EXAM: MRI ABDOMEN WITHOUT AND WITH CONTRAST   TECHNIQUE: Multiplanar multisequence MR imaging of the abdomen was performed both before and after the administration of intravenous contrast.   CONTRAST:  10mL GADAVIST  GADOBUTROL  1 MMOL/ML IV SOLN   COMPARISON:  CT abdomen pelvis, 07/04/2023   FINDINGS: Lower chest: No acute abnormality.   Hepatobiliary: No solid liver abnormality is seen. Hepatic steatosis. Status post cholecystectomy. No biliary ductal dilatation.   Pancreas: Unremarkable. No pancreatic ductal dilatation or surrounding inflammatory changes.   Spleen: Normal in size without significant abnormality.   Adrenals/Urinary Tract: Adrenal glands are unremarkable. Heterogeneously enhancing mixed solid and cystic mass arising from the posterior inferior pole of the right kidney measuring 5.0 x 3.7 cm (series 14, image 66). Additional similar mass within the right renal sinus measuring 1.9 x 1.7 cm (series 14, image 54). Numerous additional simple and thinly septated, benign fluid signal renal cortical cysts, for which no further follow-up or characterization is required. Numerous small bilateral renal calculi not well appreciated by MR. No hydronephrosis.   Stomach/Bowel: Stomach is within normal limits. No evidence of bowel wall thickening, distention, or inflammatory changes. Pancolonic diverticulosis.   Vascular/Lymphatic: No significant vascular findings are present. No enlarged abdominal lymph nodes.   Other: No abdominal wall hernia or abnormality. No ascites.   Musculoskeletal: No acute or significant osseous findings.   IMPRESSION: 1. Heterogeneously enhancing mixed solid and cystic mass arising from the posterior inferior pole of the right kidney measuring 5.0 x 3.7 cm. 2. Additional similar mass within the right renal sinus measuring 1.9 x 1.7 cm. 3. Findings are consistent  with multifocal renal cell carcinoma (Bosniak category IV). 4. No evidence of renal vein invasion, lymphadenopathy, or metastatic disease in the abdomen. 5. Numerous small bilateral renal calculi not well appreciated by MR. No hydronephrosis. 6. Hepatic steatosis.   These results will be called to the ordering clinician or representative by the Radiologist Assistant, and communication documented in the PACS or [employer].     Electronically Signed   By: Marolyn JONETTA Jaksch M.D.   On: 08/23/2023 15:26   This was personally reviewed and I agree with the radiologic interpretation.  This was also compared to previous renal ultrasounds  and cross-sectional imaging for historical perspective.   Assessment & Plan:     1. Renal mass - He has two masses on the right kidney - The larger mass is located at the lower pole of the kidney classified as Bosniak 4 with cystic and solid enhancing components with significant interval growth since 2019.  There is also very similar central cystic solid lesion concerning for multifocality. - Proceed with right nephrectomy due to the high suspicion of cancer; unfortunately partial nephrectomy is not technically feasible given the location of the more central tumor and liver lesion is too large for ablative technique. - Discussed the risks of nephrectomy in detail including bleeding, infection, hernias, blood clot, stroke, heart attack, etc.  - CT of the chest without contrast to complete staging and rule out metastasis, particularly to the lungs. - Communicate with Dr. Marcelino regarding his renal status and management plan. - Schedule laparoscopic nephrectomy for January, post-holidays, to allow for his recovery and completion of other medical evaluations (e.g., colonoscopy). - Discussed the implications of living with one kidney, including the risk of chronic kidney disease progression and the importance of maintaining hydration and blood pressure control      2. CKD, Stage 3a - Monitor renal function post-nephrectomy, anticipating an increase in creatinine levels to approximately 2.1, indicating progression to stage 3B or 4.  We also discussed the risk of progression to end-stage renal disease. - Continue current blood pressure management to protect renal function. - Follow up with nephrology as needed for optimization of renal health.   Return for nephrectomy.   I have reviewed the above documentation for accuracy and completeness, and I agree with the above.    John Riis, MD       [facility] [address], [address] [place], [place] [address] [phone]

## 2023-10-16 NOTE — Anesthesia Procedure Notes (Signed)
 Procedure Name: Intubation Date/Time: 10/16/2023 10:23 AM  Performed by: Gillermo Spruce I, CRNAPre-anesthesia Checklist: Patient identified, Emergency Drugs available, Suction available, Patient being monitored and Timeout performed Patient Re-evaluated:Patient Re-evaluated prior to induction Oxygen Delivery Method: Circle system utilized Preoxygenation: Pre-oxygenation with 100% oxygen Induction Type: IV induction Ventilation: Mask ventilation without difficulty Laryngoscope Size: McGrath and 4 Grade View: Grade II Tube type: Oral Tube size: 7.5 mm Number of attempts: 1 Airway Equipment and Method: Stylet Placement Confirmation: ETT inserted through vocal cords under direct vision, positive ETCO2, CO2 detector and breath sounds checked- equal and bilateral Secured at: 24 cm Tube secured with: Tape Dental Injury: Teeth and Oropharynx as per pre-operative assessment

## 2023-10-17 ENCOUNTER — Encounter: Payer: Self-pay | Admitting: Urology

## 2023-10-17 LAB — BASIC METABOLIC PANEL
Anion gap: 10 (ref 5–15)
BUN: 21 mg/dL (ref 8–23)
CO2: 21 mmol/L — ABNORMAL LOW (ref 22–32)
Calcium: 8.2 mg/dL — ABNORMAL LOW (ref 8.9–10.3)
Chloride: 102 mmol/L (ref 98–111)
Creatinine, Ser: 2.15 mg/dL — ABNORMAL HIGH (ref 0.61–1.24)
GFR, Estimated: 34 mL/min — ABNORMAL LOW (ref >60)
Glucose, Bld: 150 mg/dL — ABNORMAL HIGH (ref 70–99)
Potassium: 4.8 mmol/L (ref 3.5–5.1)
Sodium: 133 mmol/L — ABNORMAL LOW (ref 135–145)

## 2023-10-17 LAB — CBC
HCT: 43.1 % (ref 39.0–52.0)
Hemoglobin: 14.3 g/dL (ref 13.0–17.0)
MCH: 28.8 pg (ref 26.0–34.0)
MCHC: 33.2 g/dL (ref 30.0–36.0)
MCV: 86.7 fL (ref 80.0–100.0)
Platelets: 284 10*3/uL (ref 150–400)
RBC: 4.97 MIL/uL (ref 4.22–5.81)
RDW: 15.4 % (ref 11.5–15.5)
WBC: 20.8 10*3/uL — ABNORMAL HIGH (ref 4.0–10.5)
nRBC: 0 % (ref 0.0–0.2)

## 2023-10-17 MED ORDER — FLUTICASONE PROPIONATE 50 MCG/ACT NA SUSP
1.0000 | Freq: Every day | NASAL | Status: DC
  Administered 2023-10-17: 1 via NASAL
  Filled 2023-10-17: qty 16

## 2023-10-17 MED ORDER — SALINE SPRAY 0.65 % NA SOLN
1.0000 | NASAL | Status: DC | PRN
  Filled 2023-10-17: qty 44

## 2023-10-17 MED FILL — Remifentanil HCl For IV Soln 1 MG: INTRAVENOUS | Qty: 1000 | Status: AC

## 2023-10-17 NOTE — TOC CM/SW Note (Signed)
 Transition of Care [facility]) - Inpatient Brief Assessment   Patient Details  Name: John [name] MRN: [id number] Date of Birth: Feb 12, 1961  Transition of Care [facility]) CM/SW Contact:    Corean ONEIDA Haddock, RN Phone Number: 10/17/2023, 9:44 AM   Clinical Narrative:   Transition of Care (TOC) Screening Note   Patient Details  Name: John [name] Date of Birth: [birth year]   Transition of Care [facility]) CM/SW Contact:    Corean ONEIDA Haddock, RN Phone Number: 10/17/2023, 9:44 AM    Transition of Care Department [facility]) has reviewed patient and no TOC needs have been identified at this time.  If new patient transition needs arise, please place a TOC consult.    Transition of Care Asessment: Insurance and Status: Insurance coverage has been reviewed Patient has primary care physician: Yes     Prior/Current Home Services: No current home services Social Drivers of Health Review: SDOH reviewed no interventions necessary Readmission risk has been reviewed: Yes Transition of care needs: no transition of care needs at this time

## 2023-10-17 NOTE — Progress Notes (Signed)
 Urology Inpatient Progress Note  Subjective: No acute events overnight.  He is afebrile, VSS. Hemoglobin down, 14.3.  WBC count up, 20.8.  Creatinine up, 2.15. He reports he had a difficult night with pain and nausea, though these have significantly improved as of this morning. Foley catheter in place draining clear, yellow urine.  Anti-infectives: Anti-infectives (From admission, onward)    Start     Dose/Rate Route Frequency Ordered Stop   10/16/23 1830  ceFAZolin  (ANCEF ) IVPB 1 g/50 mL premix        1 g 100 mL/hr over 30 Minutes Intravenous Every 8 hours 10/16/23 1733 10/17/23 0909   10/16/23 0923  ceFAZolin  (ANCEF ) IVPB 2g/100 mL premix        2 g 200 mL/hr over 30 Minutes Intravenous 30 min pre-op 10/16/23 9076 10/16/23 1041       Current Facility-Administered Medications  Medication Dose Route Frequency Provider Last Rate Last Admin   0.9 %  sodium chloride  infusion   Intravenous Continuous Penne Knee, MD 75 mL/hr at 10/17/23 0643 New Bag at 10/17/23 9356   acetaminophen  (TYLENOL ) tablet 650 mg  650 mg Oral Q4H PRN Penne Knee, MD       allopurinol  (ZYLOPRIM ) tablet 200 mg  200 mg Oral QHS Penne Knee, MD   200 mg at 10/16/23 2108   docusate sodium  (COLACE) capsule 100 mg  100 mg Oral BID Penne Knee, MD   100 mg at 10/17/23 0911   heparin  injection 5,000 Units  5,000 Units Subcutaneous Q8H Penne Knee, MD   5,000 Units at 10/17/23 0640   losartan  (COZAAR ) tablet 50 mg  50 mg Oral Daily Penne Knee, MD   50 mg at 10/17/23 0911   morphine  (PF) 2 MG/ML injection 2-4 mg  2-4 mg Intravenous Q2H PRN Penne Knee, MD   2 mg at 10/17/23 0640   ondansetron  (ZOFRAN ) injection 4 mg  4 mg Intravenous Q4H PRN Penne Knee, MD   4 mg at 10/17/23 9261   oxybutynin  (DITROPAN ) tablet 5 mg  5 mg Oral Q8H PRN Penne Knee, MD       oxyCODONE -acetaminophen  (PERCOCET/ROXICET) 5-325 MG per tablet 1-2 tablet  1-2 tablet Oral Q4H PRN Penne Knee, MD   2 tablet at  10/17/23 0335   rosuvastatin  (CRESTOR ) tablet 40 mg  40 mg Oral Daily Penne Knee, MD   40 mg at 10/17/23 9089   tamsulosin  (FLOMAX ) capsule 0.4 mg  0.4 mg Oral QPC supper Penne Knee, MD   0.4 mg at 10/16/23 1948   zolpidem  (AMBIEN ) tablet 5 mg  5 mg Oral QHS PRN,MR X 1 Penne Knee, MD   5 mg at 10/16/23 2108     Objective: Vital signs in last 24 hours: Temp:  [97.2 F (36.2 C)-98.1 F (36.7 C)] 98.1 F (36.7 C) (01/14 0739) Pulse Rate:  [79-113] 84 (01/14 0739) Resp:  [8-20] 16 (01/14 0739) BP: (106-154)/(73-91) 126/73 (01/14 0739) SpO2:  [92 %-98 %] 95 % (01/14 0739)  Intake/Output from previous day: 01/13 0701 - 01/14 0700 In: 2500 [I.V.:2500] Out: 405 [Urine:255; Blood:150] Intake/Output this shift: No intake/output data recorded.  Physical Exam Vitals and nursing note reviewed.  Constitutional:      General: He is not in acute distress.    Appearance: He is not ill-appearing, toxic-appearing or diaphoretic.  HENT:     Head: Normocephalic and atraumatic.  Pulmonary:     Effort: Pulmonary effort is normal. No respiratory distress.  Abdominal:     Comments: Abdomen  is soft with appropriate postoperative tenderness, without rigidity or rebound.  Surgical incisions are clean, dry, and intact with overlying surgical adhesive.  No flank ecchymosis.  Skin:    General: Skin is warm and dry.  Neurological:     Mental Status: He is alert and oriented to person, place, and time.  Psychiatric:        Mood and Affect: Mood normal.        Behavior: Behavior normal.    Lab Results:  Recent Labs    10/17/23 0436  WBC 20.8*  HGB 14.3  HCT 43.1  PLT 284   BMET Recent Labs    10/17/23 0436  NA 133*  K 4.8  CL 102  CO2 21*  GLUCOSE 150*  BUN 21  CREATININE 2.15*  CALCIUM  8.2*   Assessment & Plan: 63 year old male POD 1 from hand-assisted laparoscopic right radical nephrectomy with Dr. Penne for management of a right renal mass.  Appropriate postop  changes seen on a.m. labs.  Abdominal exam with no significant findings.  Will plan to keep him overnight tonight for continued pain control.  Plan: -Advance diet as tolerated -Ambulate with nursing -Pain control -Foley removal today -Will reassess tomorrow morning, tentatively anticipate discharge tomorrow pending pain control  Lucie Hones, PA-C 10/17/2023

## 2023-10-17 NOTE — Progress Notes (Signed)
 Patient dangled and stood at bedside with minimal asst, Min-mod pain level.

## 2023-10-17 NOTE — Anesthesia Preprocedure Evaluation (Signed)
 Anesthesia Evaluation  Patient identified by MRN, date of birth, ID band Patient awake    Reviewed: Allergy & Precautions, NPO status , Patient's Chart, lab work & pertinent test results  History of Anesthesia Complications Negative for: history of anesthetic complications  Airway Mallampati: III  TM Distance: >3 FB Neck ROM: full    Dental no notable dental hx.    Pulmonary neg pulmonary ROS   Pulmonary exam normal        Cardiovascular hypertension, On Medications negative cardio ROS Normal cardiovascular exam     Neuro/Psych  Neuromuscular disease  negative psych ROS   GI/Hepatic negative GI ROS,,,Gilbert's syndrome    Endo/Other  negative endocrine ROS    Renal/GU Renal disease     Musculoskeletal   Abdominal   Peds  Hematology negative hematology ROS (+)   Anesthesia Other Findings Past Medical History: No date: Benign prostatic hyperplasia without lower urinary tract  symptoms No date: Chronic kidney disease No date: Cubital tunnel syndrome on right No date: Degenerative tear of glenoid labrum of right shoulder No date: Disorder of calcium  metabolism No date: Displacement of lumbosacral intervertebral disc with  myelopathy No date: Gilbert's syndrome No date: Gout involving toe No date: History of kidney stones No date: Hyperlipidemia No date: Hypertension No date: Lumbar degenerative disc disease No date: Postlaminectomy syndrome of lumbar region No date: Right sided sciatica No date: Rotator cuff tendinitis, right  Past Surgical History: No date: BACK SURGERY No date: CHOLECYSTECTOMY 12/18/2018: CYSTOSCOPY WITH LITHOLAPAXY; N/A     Comment:  Procedure: CYSTOSCOPY WITH LITHOLAPAXY;  Surgeon:               Twylla Glendia BROCKS, MD;  Location: [facility];  Service:               Urology;  Laterality: N/A; 10/12/2017: CYSTOSCOPY/URETEROSCOPY/HOLMIUM LASER/STENT PLACEMENT; Left     Comment:  Procedure:  CYSTOSCOPY/URETEROSCOPY/HOLMIUM LASER/STENT               PLACEMENT;  Surgeon: Twylla Glendia BROCKS, MD;  Location:               [facility];  Service: Urology;  Laterality: Left; No date: LUMBAR FUSION     Comment:  L5S1 10/27/2016: SHOULDER ARTHROSCOPY WITH SUBACROMIAL DECOMPRESSION; Right     Comment:  Procedure: SHOULDER ARTHROSCOPY WITH SUBACROMIAL               DECOMPRESSION  AND DEBRIDEMENT;  Surgeon: Norleen JINNY Maltos,               MD;  Location: [facility] OR[facility];  Service: Orthopedics;                Laterality: Right; 2003: SHOULDER SURGERY; Right  BMI    Body Mass Index: 30.52 kg/m      Reproductive/Obstetrics negative OB ROS                             Anesthesia Physical Anesthesia Plan  ASA: 2  Anesthesia Plan: General ETT   Post-op Pain Management: Ofirmev  IV (intra-op)*, Toradol  IV (intra-op)* and Dilaudid  IV   Induction: Intravenous  PONV Risk Score and Plan: 2 and Ondansetron , Dexamethasone , Midazolam  and Treatment may vary due to age or medical condition  Airway Management Planned: Oral ETT  Additional Equipment:   Intra-op Plan:   Post-operative Plan: Extubation in OR  Informed Consent: I have reviewed the patients History and Physical, chart, labs  and discussed the procedure including the risks, benefits and alternatives for the proposed anesthesia with the patient or authorized representative who has indicated his/her understanding and acceptance.     Dental Advisory Given  Plan Discussed with: Anesthesiologist, CRNA and Surgeon  Anesthesia Plan Comments: (Patient consented for risks of anesthesia including but not limited to:  - adverse reactions to medications - damage to eyes, teeth, lips or other oral mucosa - nerve damage due to positioning  - sore throat or hoarseness - Damage to heart, brain, nerves, lungs, other parts of body or loss of life  Patient voiced understanding and assent.)       Anesthesia Quick Evaluation

## 2023-10-17 NOTE — Plan of Care (Signed)
  Problem: Education: Goal: Knowledge of General Education information will improve Description: Including pain rating scale, medication(s)/side effects and non-pharmacologic comfort measures Outcome: Progressing   Problem: Health Behavior/Discharge Planning: Goal: Ability to manage health-related needs will improve Outcome: Not Progressing   Problem: Clinical Measurements: Goal: Ability to maintain clinical measurements within normal limits will improve Outcome: Progressing Goal: Will remain free from infection Outcome: Progressing Goal: Diagnostic test results will improve Outcome: Progressing Goal: Respiratory complications will improve Outcome: Progressing Goal: Cardiovascular complication will be avoided Outcome: Progressing   Problem: Activity: Goal: Risk for activity intolerance will decrease Outcome: Progressing   Problem: Nutrition: Goal: Adequate nutrition will be maintained Outcome: Progressing   Problem: Coping: Goal: Level of anxiety will decrease Outcome: Progressing   Problem: Elimination: Goal: Will not experience complications related to bowel motility Outcome: Progressing Goal: Will not experience complications related to urinary retention Outcome: Progressing   Problem: Pain Management: Goal: General experience of comfort will improve Outcome: Progressing   Problem: Safety: Goal: Ability to remain free from injury will improve Outcome: Progressing   Problem: Skin Integrity: Goal: Risk for impaired skin integrity will decrease Outcome: Progressing   Problem: Bowel/Gastric: Goal: Gastrointestinal status for postoperative course will improve Outcome: Progressing   Problem: Clinical Measurements: Goal: Postoperative complications will be avoided or minimized Outcome: Progressing   Problem: Respiratory: Goal: Ability to achieve and maintain a regular respiratory rate will improve Outcome: Progressing   Problem: Skin Integrity: Goal:  Demonstration of wound healing without infection will improve Outcome: Progressing   Problem: Urinary Elimination: Goal: Ability to avoid or minimize complications of infection will improve Outcome: Progressing Goal: Ability to achieve and maintain urine output will improve Outcome: Progressing

## 2023-10-17 NOTE — Anesthesia Postprocedure Evaluation (Signed)
 Anesthesia Post Note  Patient: John [name]  Procedure(s) Performed: HAND ASSISTED LAPAROSCOPIC RADICAL NEPHRECTOMY (Right: Abdomen)  Patient location during evaluation: PACU Anesthesia Type: General Level of consciousness: awake and alert Pain management: pain level controlled Vital Signs Assessment: post-procedure vital signs reviewed and stable Respiratory status: spontaneous breathing, nonlabored ventilation, respiratory function stable and patient connected to nasal cannula oxygen Cardiovascular status: blood pressure returned to baseline and stable Postop Assessment: no apparent nausea or vomiting Anesthetic complications: no   No notable events documented.   Last Vitals:  Vitals:   10/17/23 0333 10/17/23 0739  BP: 129/77 126/73  Pulse: 90 84  Resp: 20 16  Temp: 36.7 C 36.7 C  SpO2: 94% 95%    Last Pain:  Vitals:   10/17/23 0739  TempSrc: Oral  PainSc:                  John [name]

## 2023-10-18 LAB — CBC
HCT: 39 % (ref 39.0–52.0)
Hemoglobin: 12.8 g/dL — ABNORMAL LOW (ref 13.0–17.0)
MCH: 28.6 pg (ref 26.0–34.0)
MCHC: 32.8 g/dL (ref 30.0–36.0)
MCV: 87.2 fL (ref 80.0–100.0)
Platelets: 233 10*3/uL (ref 150–400)
RBC: 4.47 MIL/uL (ref 4.22–5.81)
RDW: 16 % — ABNORMAL HIGH (ref 11.5–15.5)
WBC: 13.7 10*3/uL — ABNORMAL HIGH (ref 4.0–10.5)
nRBC: 0 % (ref 0.0–0.2)

## 2023-10-18 LAB — BASIC METABOLIC PANEL
Anion gap: 8 (ref 5–15)
BUN: 23 mg/dL (ref 8–23)
CO2: 24 mmol/L (ref 22–32)
Calcium: 8.1 mg/dL — ABNORMAL LOW (ref 8.9–10.3)
Chloride: 103 mmol/L (ref 98–111)
Creatinine, Ser: 2.39 mg/dL — ABNORMAL HIGH (ref 0.61–1.24)
GFR, Estimated: 30 mL/min — ABNORMAL LOW (ref >60)
Glucose, Bld: 127 mg/dL — ABNORMAL HIGH (ref 70–99)
Potassium: 4.1 mmol/L (ref 3.5–5.1)
Sodium: 135 mmol/L (ref 135–145)

## 2023-10-18 MED ORDER — ONDANSETRON HCL 4 MG PO TABS: 4.0000 mg | ORAL_TABLET | Freq: Three times a day (TID) | ORAL | 0 refills | Status: DC | PRN

## 2023-10-18 MED ORDER — OXYCODONE-ACETAMINOPHEN 5-325 MG PO TABS: 1.0000 | ORAL_TABLET | ORAL | 0 refills | Status: DC | PRN

## 2023-10-18 MED ORDER — DOCUSATE SODIUM 100 MG PO CAPS: 100.0000 mg | ORAL_CAPSULE | Freq: Two times a day (BID) | ORAL | 0 refills | Status: DC

## 2023-10-18 MED ORDER — ALLOPURINOL 100 MG PO TABS: 100.0000 mg | ORAL_TABLET | Freq: Every day | ORAL | 0 refills | Status: AC

## 2023-10-18 NOTE — Discharge Instructions (Addendum)
 Activity:  You are encouraged to ambulate frequently (about every hour during waking hours) to help prevent blood clots from forming in your legs or lungs.  However, you should not engage in any heavy lifting (> 5-10 lbs), strenuous activity, or straining.  Diet: You should advance your diet as instructed by your physician.  It will be normal to have some bloating, nausea, and abdominal discomfort intermittently.  Prescriptions:  You will be provided a prescription for pain medication to take as needed.  If your pain is not severe enough to require the prescription pain medication, you may take extra strength Tylenol  instead which will have less side effects.  You should also take a prescribed stool softener to avoid straining with bowel movements as the prescription pain medication may constipate you.  Incisions: You may remove your dressing bandages 48 hours after surgery if not removed in the hospital.  You will either have some small staples or special tissue glue at each of the incision sites. Once the bandages are removed (if present), the incisions may stay open to air.  You may start showering (but not soaking or bathing in water ) the 2nd day after surgery and the incisions simply need to be patted dry after the shower.  No additional care is needed.  What to call us  about: You should call the office if you develop fever > 101 or develop persistent vomiting, redness or draining around your incision, or any other concerning symptoms.    [facility] [address] [place], [place] [address] [phone]   We have also decreased your allopurinol  prescription from 200 mg at night to 100 mg at night until you have a follow-up with Dr. Ace Holder.  This is to allow for your left kidney more time normalized.

## 2023-10-18 NOTE — Plan of Care (Signed)
   Problem: Education: Goal: Knowledge of General Education information will improve Description: Including pain rating scale, medication(s)/side effects and non-pharmacologic comfort measures Outcome: Progressing   Problem: Health Behavior/Discharge Planning: Goal: Ability to manage health-related needs will improve Outcome: Progressing   Problem: Clinical Measurements: Goal: Ability to maintain clinical measurements within normal limits will improve Outcome: Progressing Goal: Will remain free from infection Outcome: Progressing Goal: Diagnostic test results will improve Outcome: Progressing Goal: Respiratory complications will improve Outcome: Progressing Goal: Cardiovascular complication will be avoided Outcome: Progressing   Problem: Activity: Goal: Risk for activity intolerance will decrease Outcome: Progressing   Problem: Nutrition: Goal: Adequate nutrition will be maintained Outcome: Progressing   Problem: Coping: Goal: Level of anxiety will decrease Outcome: Progressing   Problem: Elimination: Goal: Will not experience complications related to bowel motility Outcome: Progressing Goal: Will not experience complications related to urinary retention Outcome: Progressing   Problem: Pain Management: Goal: General experience of comfort will improve Outcome: Progressing   Problem: Safety: Goal: Ability to remain free from injury will improve Outcome: Progressing   Problem: Skin Integrity: Goal: Risk for impaired skin integrity will decrease Outcome: Progressing   Problem: Education: Goal: Knowledge of the prescribed therapeutic regimen will improve Outcome: Progressing   Problem: Bowel/Gastric: Goal: Gastrointestinal status for postoperative course will improve Outcome: Progressing   Problem: Clinical Measurements: Goal: Postoperative complications will be avoided or minimized Outcome: Progressing   Problem: Respiratory: Goal: Ability to achieve and  maintain a regular respiratory rate will improve Outcome: Progressing   Problem: Skin Integrity: Goal: Demonstration of wound healing without infection will improve Outcome: Progressing   Problem: Urinary Elimination: Goal: Ability to avoid or minimize complications of infection will improve Outcome: Progressing Goal: Ability to achieve and maintain urine output will improve Outcome: Progressing

## 2023-10-18 NOTE — Discharge Summary (Signed)
Date of admission: 10/16/2023  Date of discharge: 10/18/2023  Admission diagnosis: Right renal mass  Discharge diagnosis: Right renal mass; status post hand-assisted laparoscopic right radical nephrectomy  Secondary diagnoses:  Patient Active Problem List   Diagnosis Date Noted   Right renal mass 10/16/2023   Benign prostatic hyperplasia without lower urinary tract symptoms 10/29/2018   Gilbert's syndrome 10/29/2018   Gout involving toe 10/29/2018   Displacement of lumbosacral intervertebral disc with myelopathy 01/01/2018   Lumbar degenerative disc disease 01/01/2018   Right sided sciatica 01/01/2018   Postlaminectomy syndrome of lumbar region 01/01/2018   BMI 30.0-30.9,adult 12/28/2017   Chronic kidney disease, stage 3 unspecified (HCC) 12/28/2017   Hyperlipidemia, mixed 12/28/2017   Essential hypertension 12/28/2017   Tobacco use 12/28/2017   Renal stone 10/07/2017   Cubital tunnel syndrome on right 12/12/2016   Degenerative tear of glenoid labrum of right shoulder 10/27/2016   Rotator cuff tendinitis, right 09/09/2016   Disorder of calcium metabolism 07/09/2012    History and Physical: For full details, please see admission history and physical. Briefly, John [name] is a 63 y.o. year old patient with right renal mass suspicious for renal cell carcinoma who underwent a hand-assisted laparoscopic right radical nephrectomy on October 16, 2023 with Dr. Vanna Scotland.  On postoperative day 1 he was having some difficulty with pain control and ambulation.  Overnight, he was able to ambulate.  His pain is controlled.  He is tolerating a regular diet.  He is passing flatus.  Hospital Course: Patient tolerated the procedure well.  He was then transferred to the floor after an uneventful PACU stay.  His hospital course was uncomplicated.  On POD#2 he had met discharge criteria: was eating a regular diet, was up and ambulating independently,  pain was well controlled, was voiding without a  catheter, and was ready to for discharge.  Physical exam: Constitutional:  Well nourished. Alert and oriented, No acute distress. HEENT: [place] AT, moist mucus membranes.  Trachea midline, no masses. Cardiovascular: No clubbing, cyanosis, or edema. Respiratory: Normal respiratory effort, no increased work of breathing. GI: Abdomen is soft with appropriate postoperative tenderness, without rigidity or rebound.  Surgical incisions are clean and dry and intact with overlying surgical adhesive.  No flank ecchymosis. Neurologic: Grossly intact, no focal deficits, moving all 4 extremities. Psychiatric: Normal mood and affect.    Laboratory values:  Recent Labs    10/17/23 0436 10/18/23 0429  WBC 20.8* 13.7*  HGB 14.3 12.8*  HCT 43.1 39.0   Recent Labs    10/17/23 0436 10/18/23 0429  NA 133* 135  K 4.8 4.1  CL 102 103  CO2 21* 24  GLUCOSE 150* 127*  BUN 21 23  CREATININE 2.15* 2.39*  CALCIUM 8.2* 8.1*   No results for input(s): "LABPT", "INR" in the last 72 hours. No results for input(s): "LABURIN" in the last 72 hours. Results for orders placed or performed during the hospital encounter of 10/10/23  Urine Culture     Status: None   Collection Time: 10/10/23  9:15 AM   Specimen: Urine, Clean Catch  Result Value Ref Range Status   Specimen Description   Final    URINE, CLEAN CATCH Performed at [facility], [address], [place], [place] [address]    Special Requests   Final    NONE Performed at [facility], [address], [place], [place] [address]    Culture   Final    NO GROWTH Performed at  [facility], [address], [place], [place] [address]    Report Status 10/11/2023 FINAL  Final    Disposition: Home  Discharge instruction: The patient was instructed to be ambulatory but told to refrain from heavy lifting, strenuous activity, or driving.     Discharge medications:  Allergies as of 10/18/2023   No Known Allergies       Medication List     TAKE these medications    allopurinol 100 MG tablet Commonly known as: ZYLOPRIM Take 1 tablet (100 mg total) by mouth at bedtime. What changed: how much to take   docusate sodium 100 MG capsule Commonly known as: COLACE Take 1 capsule (100 mg total) by mouth 2 (two) times daily.   losartan 50 MG tablet Commonly known as: COZAAR Take 50 mg by mouth daily.   ondansetron 4 MG tablet Commonly known as: Zofran Take 1 tablet (4 mg total) by mouth every 8 (eight) hours as needed for nausea or vomiting.   oxyCODONE-acetaminophen 5-325 MG tablet Commonly known as: PERCOCET/ROXICET Take 1-2 tablets by mouth every 4 (four) hours as needed for moderate pain (pain score 4-6).   rosuvastatin 40 MG tablet Commonly known as: CRESTOR Take 1 tablet (40 mg total) by mouth daily.   tamsulosin 0.4 MG Caps capsule Commonly known as: FLOMAX TAKE 1 CAPSULE(0.4 MG) BY MOUTH AT BEDTIME        Followup:  Follow up scheduled for 11/21/2022 for postoperative follow up with Dr. Apolinar Junes

## 2023-10-19 LAB — SURGICAL PATHOLOGY

## 2023-10-23 ENCOUNTER — Encounter: Payer: Self-pay | Admitting: Urology

## 2023-11-14 DIAGNOSIS — N1831 Chronic kidney disease, stage 3a: Secondary | ICD-10-CM | POA: Diagnosis not present

## 2023-11-14 DIAGNOSIS — N200 Calculus of kidney: Secondary | ICD-10-CM | POA: Diagnosis not present

## 2023-11-14 DIAGNOSIS — N1832 Chronic kidney disease, stage 3b: Secondary | ICD-10-CM | POA: Diagnosis not present

## 2023-11-14 DIAGNOSIS — I10 Essential (primary) hypertension: Secondary | ICD-10-CM | POA: Diagnosis not present

## 2023-11-14 DIAGNOSIS — M109 Gout, unspecified: Secondary | ICD-10-CM | POA: Diagnosis not present

## 2023-11-21 ENCOUNTER — Ambulatory Visit: Payer: 59 | Admitting: Urology

## 2023-11-22 ENCOUNTER — Ambulatory Visit: Payer: 59 | Admitting: Urology

## 2023-11-30 ENCOUNTER — Ambulatory Visit (INDEPENDENT_AMBULATORY_CARE_PROVIDER_SITE_OTHER): Payer: 59 | Admitting: Physician Assistant

## 2023-11-30 DIAGNOSIS — C641 Malignant neoplasm of right kidney, except renal pelvis: Secondary | ICD-10-CM

## 2023-11-30 DIAGNOSIS — N50811 Right testicular pain: Secondary | ICD-10-CM | POA: Diagnosis not present

## 2023-11-30 NOTE — Progress Notes (Signed)
 11/30/2023 9:07 AM   John [name] 08-04-1961 [id number]  CC: Chief Complaint  Patient presents with   Routine Post Op    HPI: John [name] is a 63 y.o. male with PMH BPH with LUTS on Flomax and nephrolithiasis now s/p from hand assisted laparoscopic right radical nephrectomy with Dr. Apolinar Junes on 10/16/2023 for management of right renal mass who presents today for postop follow-up.  He is accompanied today by his wife, who contributes to HPI.  Surgical pathology has finalized with multifocal clear-cell renal cell carcinoma with invasion into the renal vein/segmental branches, however all margins were negative; pathologic stage pT3a.  Renal function panel dated 11/14/2023 with creatinine 2.48 with EGFR 28.  He saw Dr. Cherylann Ratel earlier this month, who is monitoring his renal function closely.  Today he reports he is recovering well from surgery.  They are very grateful for his care.  He has had some right testicular discomfort that radiates to the RLQ without dysuria or testicular swelling.  He has been wearing boxers.  He has also noticed some numbness at his largest surgical incision.  PMH: Past Medical History:  Diagnosis Date   Benign prostatic hyperplasia without lower urinary tract symptoms    Chronic kidney disease    Cubital tunnel syndrome on right    Degenerative tear of glenoid labrum of right shoulder    Disorder of calcium metabolism    Displacement of lumbosacral intervertebral disc with myelopathy    Gilbert's syndrome    Gout involving toe    History of kidney stones    Hyperlipidemia    Hypertension    Lumbar degenerative disc disease    Postlaminectomy syndrome of lumbar region    Right sided sciatica    Rotator cuff tendinitis, right     Surgical History: Past Surgical History:  Procedure Laterality Date   BACK SURGERY     CHOLECYSTECTOMY     CYSTOSCOPY WITH LITHOLAPAXY N/A 12/18/2018   Procedure: CYSTOSCOPY WITH LITHOLAPAXY;  Surgeon: Riki Altes, MD;   Location: [facility];  Service: Urology;  Laterality: N/A;   CYSTOSCOPY/URETEROSCOPY/HOLMIUM LASER/STENT PLACEMENT Left 10/12/2017   Procedure: CYSTOSCOPY/URETEROSCOPY/HOLMIUM LASER/STENT PLACEMENT;  Surgeon: Riki Altes, MD;  Location: [facility];  Service: Urology;  Laterality: Left;   LAPAROSCOPIC NEPHRECTOMY, HAND ASSISTED Right 10/16/2023   Procedure: HAND ASSISTED LAPAROSCOPIC RADICAL NEPHRECTOMY;  Surgeon: Vanna Scotland, MD;  Location: [facility];  Service: Urology;  Laterality: Right;   LUMBAR FUSION     L5S1   SHOULDER ARTHROSCOPY WITH SUBACROMIAL DECOMPRESSION Right 10/27/2016   Procedure: SHOULDER ARTHROSCOPY WITH SUBACROMIAL DECOMPRESSION  AND DEBRIDEMENT;  Surgeon: Christena Flake, MD;  Location: [facility];  Service: Orthopedics;  Laterality: Right;   SHOULDER SURGERY Right 2003    Home Medications:  Allergies as of 11/30/2023   No Known Allergies      Medication List        Accurate as of November 30, 2023  9:07 AM. If you have any questions, ask your nurse or doctor.          allopurinol 100 MG tablet Commonly known as: ZYLOPRIM Take 1 tablet (100 mg total) by mouth at bedtime.   docusate sodium 100 MG capsule Commonly known as: COLACE Take 1 capsule (100 mg total) by mouth 2 (two) times daily.   losartan 50 MG tablet Commonly known as: COZAAR Take 50 mg by mouth daily.   ondansetron 4 MG tablet Commonly known as: Zofran Take 1 tablet (4 mg total) by  mouth every 8 (eight) hours as needed for nausea or vomiting.   oxyCODONE-acetaminophen 5-325 MG tablet Commonly known as: PERCOCET/ROXICET Take 1-2 tablets by mouth every 4 (four) hours as needed for moderate pain (pain score 4-6).   rosuvastatin 40 MG tablet Commonly known as: CRESTOR Take 1 tablet (40 mg total) by mouth daily.   tamsulosin 0.4 MG Caps capsule Commonly known as: FLOMAX TAKE 1 CAPSULE(0.4 MG) BY MOUTH AT BEDTIME        Allergies:  No Known Allergies  Family History: Family History   Problem Relation Age of Onset   Heart disease Father    Prostate cancer Neg Hx    Bladder Cancer Neg Hx    Kidney cancer Neg Hx     Social History:   reports that he has never smoked. He has quit using smokeless tobacco.  His smokeless tobacco use included snuff. He reports that he does not drink alcohol and does not use drugs.  Physical Exam: There were no vitals taken for this visit.  Constitutional:  Alert and oriented, no acute distress, nontoxic appearing HEENT: [place], AT Cardiovascular: No clubbing, cyanosis, or edema Respiratory: Normal respiratory effort, no increased work of breathing Abdomen: Well-healed nephrectomy incisions. GU: Bilateral descended testicles, nonenlarged and nontender bilateral epididymides.  Some diffuse tenderness of the right hemiscrotum without erythema, fluctuance, crepitus, or scrotal thickening/edema. Skin: No rashes, bruises or suspicious lesions Neurologic: Grossly intact, no focal deficits, moving all 4 extremities Psychiatric: Normal mood and affect  Laboratory Data: Results for orders placed or performed during the hospital encounter of 10/16/23  Surgical pathology   Collection Time: 10/16/23 12:00 AM  Result Value Ref Range   SURGICAL PATHOLOGY      SURGICAL PATHOLOGY [facility] [address] [place], [place] [address] Telephone [phone] or [phone] Fax [phone]  REPORT OF SURGICAL PATHOLOGY   Accession #: [id number] Patient Name: John, [name] Visit # : [id number]  MRN: [id number] Physician: Vanna Scotland DOB/Age Apr 02, 1961 (Age: 63) Gender: M Collected Date: 10/16/2023 Received Date: 10/17/2023  FINAL DIAGNOSIS       1. Kidney, radical nephrectomy for tumor, right :       - CLEAR CELL RENAL CELL CARCINOMA, MULTIFOCAL.      - SEE CANCER SUMMARY BELOW.       Diagnosis Note : The carcinoma is positive for CAIX (membraneous), negative for      cytokeratin 7, and demonstrates patchy  staining with AMACR. This pattern of      immunoreactivity supports the above diagnosis. A PAS stain was examined to      evaluate the non-neoplastic renal parenchyma. Stain controls worked      appropriately.      Dr. Apolinar Junes was notified on 10/19/2023. This case underwent intradepar tmental      consultation and Dr. Maurice March concurs with the interpretation.      DATE SIGNED OUT: 10/19/2023 ELECTRONIC SIGNATURE : Oneita Kras Md, Delice Bison , Pathologist, Electronic Signature  MICROSCOPIC DESCRIPTION 1. CANCER CASE SUMMARY: KIDNEY Standard(s): AJCC-UICC 8  SPECIMEN Procedure: Radical nephrectomy Specimen Laterality: Right  TUMOR Tumor Focality: Multifocal (at least 3) Tumor Size: Greatest dimension 4.1 cm Histologic Type: Clear-cell renal cell carcinoma Histologic Grade (WHO / ISUP Grade): Grade 4 Tumor Extent: Extends into renal vein or its segmental branches Histologic Features: Sarcomatoid or rhabdoid features not identified Tumor Necrosis: Present, up to 5% necrosis Lymphatic and / or Vascular Invasion (excluding renal vein and its segmental branches and inferior vena  cava): Present MARGINS Margin Status: All margins negative for invasive carcinoma  REGIONAL LYMPH NODES Regional Lymph Node Status: Not applicable (no regional lymph nodes submitte d or found)  DISTANT METASTASIS Distant Site(s) Involved, if applicable: Not applicable  PATHOLOGIC STAGE CLASSIFICATION (pTNM, AJCC 8th Edition):  Modified Classification: Not applicable  pT3a T Suffix: (m) multiple primary synchronous tumors in a single organ pN - Not assigned (no nodes submitted or found) pM - Not applicable  ADDITIONAL FINDINGS Additional Pathologic Findings in Non-neoplastic Kidney: None (v4.2.0.0)  CASE COMMENTS STAINS USED IN DIAGNOSIS: H&E H&E H&E H&E H&E H&E H&E H&E H&E H&E H&E H&E H&E H&E H&E H&E *RECUT 2 SLIDES Universal Negative Control-DAB CA-IX *RECUT 2 SLIDES Stains used in  diagnosis 1 CA-IX, 1 CK-7, 1 P504S (RACEMASE), 1 PAS/H w/o Dig Some of these immunohistochemical stains may have been developed and the performance characteristics determined by [facility].  Some may not have been cleared or approved by the U.S. Food and Drug Administration.  The FDA has determined that such clearance or approva l is not necessary.  This test is used for clinical purposes.  It should not be regarded as investigational or for research.  This laboratory is certified under the Clinical Laboratory Improvement Amendments of 1988 (CLIA-88) as qualified to perform high complexity clinical laboratory testing.    CLINICAL HISTORY  SPECIMEN(S) OBTAINED 1. Kidney, radical nephrectomy for tumor, Right  SPECIMEN COMMENTS: SPECIMEN CLINICAL INFORMATION: 1. Right renal mass    Gross Description 1. "Right kidney", received fresh and placed formalin.      Specimen received: Total nephrectomy      Weight: 14490.3 (fresh)      Specimen dimensions: 30.0 x 17.2 x 8.8 cm      Kidney: 14.1 x 5.9 x 5.4 cm      Perirenal fat thickness: Up to 10.5 cm      Ureter Length: 9.1 cm      Ureter Circumference: 0.9 cm      Adrenal: Surgically absent.      Inking:      Perinephric fat: Blue      Gerota's fascia: Black      Tumor A: 4.1 x 4.0 x 3.3 cm; predominantly well circumscribed with a fibro tic      capsule      Cut surfaces: Heterogeneous; 50% hemorrhagic (primarily central); 40%      tan-yellow, soft, solid (primarily peripheral); 5% cystic; 5% necrotic      (yellow-tan, soft, friable)      Location: Parenchyma, upper pole      Vasculature: Not involved.      Distance to margins and anatomic landmarks:      Vascular margin: 4.2 cm      Ureter margin: 11.8 cm      Renal Pelvis: 0.4 cm      Renal Sinus: 0.1 cm      Capsule: Less than 0.1 cm  (abutting)      Perirenal fat margin: 1.5 cm      Gerota's fascia:  3.0 cm      Tumor B: 2.1 x 1.8 x 1.4 cm; encapsulated  with a 0.1 cm thick, gray-white,      fibrous rim      Cut surfaces: Heterogenous; 30% cystic; 35% hemorrhagic; 35% tan-yellow, solid;      scant (1%) necrosis.      Location: Renal sinus      Vasculature: Not involved.      Distance to margins  and anatomic landmarks:      Vascular margin: 3.9 cm      Ureter margin: 10.6 cm      Renal Pelvis: 0.3 cm      Capsule: 2.4 cm      Perirenal fat  margin: 5.0 cm      Gerota's fascia: 5.2 cm      Secondary findings: Multiple cysts (1.0-3.4 cm in greatest dimension) are      present, each with a smooth wall and thin, translucent fluid. The uninvolved      renal parenchyma is brown-gray, solid, and has an identifiable, distinct      cortical medullary junction. The capsule is gray-tan, smooth, and intact without      discrete scars or pitting. Discrete lymph nodes are not grossly identified.      Block summary:      1A: Vascular margins, en face      1B: Ureter margin, en face      1C-H: Tumor A, representative      1C: Renal sinus      1D: Renal capsule      1E-F: Uninvolved parenchyma      1G: Perinephric fat      1H: Central      1I-L: Tumor B, representative      1I-J: Renal sinus and pelvis      1K: Uninvolved parenchyma      1L: Central      46M: Gerota's fascia closest to tumor      1N: Cysts, representative      1O-P: Uninvolved parenchyma      Collection time: 10/16/23 at 1105      Time in formalin: 1 /13/25 at  1349      Cold ischemia time: 164 minutes      AMG 10/17/2023        Report signed out from the following location(s) [facility] [address], [place] [address] CLIA #: [id number]  [facility] [address] [place], [place] [address] CLIA #: [id number]   ABO/Rh   Collection Time: 10/16/23  9:38 AM  Result Value Ref Range   ABO/RH(D)      O NEG Performed at [facility], [address] [place], [place] [address]   CBC   Collection Time: 10/17/23   4:36 AM  Result Value Ref Range   WBC 20.8 (H) 4.0 - 10.5 K/uL   RBC 4.97 4.22 - 5.81 MIL/uL   Hemoglobin 14.3 13.0 - 17.0 g/dL   HCT 53.6 64.4 - 03.4 %   MCV 86.7 80.0 - 100.0 fL   MCH 28.8 26.0 - 34.0 pg   MCHC 33.2 30.0 - 36.0 g/dL   RDW 74.2 59.5 - 63.8 %   Platelets 284 150 - 400 K/uL   nRBC 0.0 0.0 - 0.2 %  Basic metabolic panel   Collection Time: 10/17/23  4:36 AM  Result Value Ref Range   Sodium 133 (L) 135 - 145 mmol/L   Potassium 4.8 3.5 - 5.1 mmol/L   Chloride 102 98 - 111 mmol/L   CO2 21 (L) 22 - 32 mmol/L   Glucose, Bld 150 (H) 70 - 99 mg/dL   BUN 21 8 - 23 mg/dL   Creatinine, Ser 7.56 (H) 0.61 - 1.24 mg/dL   Calcium 8.2 (L) 8.9 - 10.3 mg/dL   GFR, Estimated 34 (L) >60 mL/min   Anion gap 10 5 - 15  Basic metabolic  panel   Collection Time: 10/18/23  4:29 AM  Result Value Ref Range   Sodium 135 135 - 145 mmol/L   Potassium 4.1 3.5 - 5.1 mmol/L   Chloride 103 98 - 111 mmol/L   CO2 24 22 - 32 mmol/L   Glucose, Bld 127 (H) 70 - 99 mg/dL   BUN 23 8 - 23 mg/dL   Creatinine, Ser 9.60 (H) 0.61 - 1.24 mg/dL   Calcium 8.1 (L) 8.9 - 10.3 mg/dL   GFR, Estimated 30 (L) >60 mL/min   Anion gap 8 5 - 15  CBC   Collection Time: 10/18/23  4:29 AM  Result Value Ref Range   WBC 13.7 (H) 4.0 - 10.5 K/uL   RBC 4.47 4.22 - 5.81 MIL/uL   Hemoglobin 12.8 (L) 13.0 - 17.0 g/dL   HCT 45.4 09.8 - 11.9 %   MCV 87.2 80.0 - 100.0 fL   MCH 28.6 26.0 - 34.0 pg   MCHC 32.8 30.0 - 36.0 g/dL   RDW 14.7 (H) 82.9 - 56.2 %   Platelets 233 150 - 400 K/uL   nRBC 0.0 0.0 - 0.2 %   Assessment & Plan:   1. Renal cell carcinoma of right kidney (HCC) (Primary) pT3a clear-cell renal cell carcinoma of the right kidney, s/p right radical nephrectomy with negative surgical margins.  CKD closely followed by nephrology.  We discussed that he will likely have long-term sensory changes at his incision, though would be normal to have tingling/burning/itching as some of the cutaneous nerves reconnect  postoperatively.   We discussed his surgical pathology.  Specifically, we discussed that options include initial surveillance with imaging every 6 months x 3 years with the addition of possible adjuvant therapy.  I offered them a referral to the cancer center to discuss this.  Overall, we discussed that this therapy is associated with GI side effects and while time to radiologic recurrence is prolonged with therapy, overall survival remains unchanged whether or not he pursues this treatment.  He would like to focus on quality of life and so does not wish to pursue adjuvant therapy, which is reasonable.  Will defer cancer center referral unless he changes his mind on this.  We will see him back in 6 months, will obtain MR abdomen with and without contrast given renal function.  No contraindications to MRI. - MR Abdomen W Wo Contrast; Future - DG Chest 2 View; Future  2. Right testicular pain No significant findings on physical exam, anticipate inflammatory.  We discussed scrotal support and I advised him to wear tight, supportive underwear in the short-term to help relieve any tension on his spermatic cord.  Return in about 6 months (around 05/29/2024) for RCC f/u with Dr. Apolinar Junes, MR and CXR prior.  Carman Ching, PA-C  [facility] [place] [address] [place], [place] [address] [phone]

## 2023-12-14 ENCOUNTER — Other Ambulatory Visit: Payer: Self-pay | Admitting: Urology

## 2023-12-14 DIAGNOSIS — N200 Calculus of kidney: Secondary | ICD-10-CM

## 2023-12-21 ENCOUNTER — Encounter: Payer: Self-pay | Admitting: Urology

## 2023-12-27 ENCOUNTER — Ambulatory Visit: Payer: 59 | Admitting: Urology

## 2024-03-14 DIAGNOSIS — M109 Gout, unspecified: Secondary | ICD-10-CM | POA: Diagnosis not present

## 2024-03-14 DIAGNOSIS — I10 Essential (primary) hypertension: Secondary | ICD-10-CM | POA: Diagnosis not present

## 2024-03-14 DIAGNOSIS — N200 Calculus of kidney: Secondary | ICD-10-CM | POA: Diagnosis not present

## 2024-03-14 DIAGNOSIS — N184 Chronic kidney disease, stage 4 (severe): Secondary | ICD-10-CM | POA: Diagnosis not present

## 2024-03-20 DIAGNOSIS — S4991XA Unspecified injury of right shoulder and upper arm, initial encounter: Secondary | ICD-10-CM | POA: Diagnosis not present

## 2024-03-20 DIAGNOSIS — M7531 Calcific tendinitis of right shoulder: Secondary | ICD-10-CM | POA: Diagnosis not present

## 2024-04-26 ENCOUNTER — Encounter: Payer: Self-pay | Admitting: Urology

## 2024-05-15 ENCOUNTER — Ambulatory Visit
Admission: RE | Admit: 2024-05-15 | Discharge: 2024-05-15 | Disposition: A | Discharge status: Home / Self Care | Source: Ambulatory Visit | Attending: Physician Assistant | Admitting: Physician Assistant

## 2024-05-15 ENCOUNTER — Other Ambulatory Visit

## 2024-05-15 DIAGNOSIS — C641 Malignant neoplasm of right kidney, except renal pelvis: Secondary | ICD-10-CM

## 2024-05-15 DIAGNOSIS — K760 Fatty (change of) liver, not elsewhere classified: Secondary | ICD-10-CM | POA: Diagnosis not present

## 2024-05-15 DIAGNOSIS — Z9049 Acquired absence of other specified parts of digestive tract: Secondary | ICD-10-CM | POA: Diagnosis not present

## 2024-05-15 DIAGNOSIS — R82998 Other abnormal findings in urine: Secondary | ICD-10-CM | POA: Diagnosis not present

## 2024-05-15 DIAGNOSIS — Z905 Acquired absence of kidney: Secondary | ICD-10-CM | POA: Diagnosis not present

## 2024-05-15 MED ORDER — GADOBUTROL 1 MMOL/ML IV SOLN
10.0000 mL | Freq: Once | INTRAVENOUS | Status: AC | PRN
  Administered 2024-05-15 (×2): 10 mL via INTRAVENOUS

## 2024-05-21 DIAGNOSIS — D2272 Melanocytic nevi of left lower limb, including hip: Secondary | ICD-10-CM | POA: Diagnosis not present

## 2024-05-21 DIAGNOSIS — L821 Other seborrheic keratosis: Secondary | ICD-10-CM | POA: Diagnosis not present

## 2024-05-21 DIAGNOSIS — Z85828 Personal history of other malignant neoplasm of skin: Secondary | ICD-10-CM | POA: Diagnosis not present

## 2024-05-21 DIAGNOSIS — D225 Melanocytic nevi of trunk: Secondary | ICD-10-CM | POA: Diagnosis not present

## 2024-05-21 DIAGNOSIS — L905 Scar conditions and fibrosis of skin: Secondary | ICD-10-CM | POA: Diagnosis not present

## 2024-05-21 DIAGNOSIS — D0461 Carcinoma in situ of skin of right upper limb, including shoulder: Secondary | ICD-10-CM | POA: Diagnosis not present

## 2024-05-21 DIAGNOSIS — D485 Neoplasm of uncertain behavior of skin: Secondary | ICD-10-CM | POA: Diagnosis not present

## 2024-05-21 DIAGNOSIS — D2262 Melanocytic nevi of left upper limb, including shoulder: Secondary | ICD-10-CM | POA: Diagnosis not present

## 2024-05-21 DIAGNOSIS — L2989 Other pruritus: Secondary | ICD-10-CM | POA: Diagnosis not present

## 2024-05-21 DIAGNOSIS — D2261 Melanocytic nevi of right upper limb, including shoulder: Secondary | ICD-10-CM | POA: Diagnosis not present

## 2024-05-21 DIAGNOSIS — L570 Actinic keratosis: Secondary | ICD-10-CM | POA: Diagnosis not present

## 2024-05-21 DIAGNOSIS — L820 Inflamed seborrheic keratosis: Secondary | ICD-10-CM | POA: Diagnosis not present

## 2024-05-29 ENCOUNTER — Ambulatory Visit (INDEPENDENT_AMBULATORY_CARE_PROVIDER_SITE_OTHER): Payer: 59 | Admitting: Urology

## 2024-05-29 VITALS — BP 136/81 | HR 73 | Ht 72.0 in | Wt 225.0 lb

## 2024-05-29 DIAGNOSIS — N2889 Other specified disorders of kidney and ureter: Secondary | ICD-10-CM | POA: Diagnosis not present

## 2024-05-29 DIAGNOSIS — C649 Malignant neoplasm of unspecified kidney, except renal pelvis: Secondary | ICD-10-CM

## 2024-05-29 NOTE — Progress Notes (Signed)
 05/29/2024 7:20 AM   John LABOR [name] [birth year] [id number]  Referring provider: Corlis Honor BROCKS, MD [address]   [place],  [place] [address]  Chief Complaint  Patient presents with   Follow-up    Urologic history:  1.  Renal cell carcinoma Hand-assisted laparoscopic right radical nephrectomy Dr. Penne 10/16/2023 Clear-cell carcinoma-pT3a   2. Recurrent stone disease Bilateral nonobstructing renal calculi Cystolitholapaxy 12/2018 Ureteroscopic stone removal 10/2017   3.  BPH with LUTS Tamsulosin  0.4 mg daily  HPI: John [name] is a 63 y.o. male who presents for 61-month follow-up.  He presents today with his wife.  No significant changes since last visit MRI abdomen with/without contrast 05/15/2024 showed no adenopathy or evidence of metastatic disease Chest x-ray 05/15/2024 without evidence of metastatic disease CBC and basic metabolic panel with PCP 03/14/2024 with creatinine/GFR 2.63/27 otherwise no abnormalities   PMH: Past Medical History:  Diagnosis Date   Benign prostatic hyperplasia without lower urinary tract symptoms    Chronic kidney disease    Cubital tunnel syndrome on right    Degenerative tear of glenoid labrum of right shoulder    Disorder of calcium  metabolism    Displacement of lumbosacral intervertebral disc with myelopathy    Gilbert's syndrome    Gout involving toe    History of kidney stones    Hyperlipidemia    Hypertension    Lumbar degenerative disc disease    Postlaminectomy syndrome of lumbar region    Right sided sciatica    Rotator cuff tendinitis, right     Surgical History: Past Surgical History:  Procedure Laterality Date   BACK SURGERY     CHOLECYSTECTOMY     CYSTOSCOPY WITH LITHOLAPAXY N/A 12/18/2018   Procedure: CYSTOSCOPY WITH LITHOLAPAXY;  Surgeon: John John BROCKS, MD;  Location: [facility];  Service: Urology;  Laterality: N/A;   CYSTOSCOPY/URETEROSCOPY/HOLMIUM LASER/STENT PLACEMENT Left 10/12/2017   Procedure:  CYSTOSCOPY/URETEROSCOPY/HOLMIUM LASER/STENT PLACEMENT;  Surgeon: John John BROCKS, MD;  Location: [facility];  Service: Urology;  Laterality: Left;   LAPAROSCOPIC NEPHRECTOMY, HAND ASSISTED Right 10/16/2023   Procedure: HAND ASSISTED LAPAROSCOPIC RADICAL NEPHRECTOMY;  Surgeon: John Knee, MD;  Location: [facility];  Service: Urology;  Laterality: Right;   LUMBAR FUSION     L5S1   SHOULDER ARTHROSCOPY WITH SUBACROMIAL DECOMPRESSION Right 10/27/2016   Procedure: SHOULDER ARTHROSCOPY WITH SUBACROMIAL DECOMPRESSION  AND DEBRIDEMENT;  Surgeon: John JINNY Maltos, MD;  Location: [facility];  Service: Orthopedics;  Laterality: Right;   SHOULDER SURGERY Right 2003    Home Medications:  Allergies as of 05/29/2024   No Known Allergies      Medication List        Accurate as of May 29, 2024 11:59 PM. If you have any questions, ask your nurse or doctor.          STOP taking these medications    docusate sodium  100 MG capsule Commonly known as: COLACE Stopped by: John [name]   ondansetron  4 MG tablet Commonly known as: Zofran  Stopped by: John [name]   oxyCODONE -acetaminophen  5-325 MG tablet Commonly known as: PERCOCET/ROXICET Stopped by: John [name]       TAKE these medications    allopurinol  100 MG tablet Commonly known as: ZYLOPRIM  Take 1 tablet (100 mg total) by mouth at bedtime.   losartan  50 MG tablet Commonly known as: COZAAR  Take 50 mg by mouth daily.   rosuvastatin  40 MG tablet Commonly known as: CRESTOR  Take 1 tablet (40 mg total)  by mouth daily.   tamsulosin  0.4 MG Caps capsule Commonly known as: FLOMAX  TAKE 1 CAPSULE(0.4 MG) BY MOUTH AT BEDTIME        Allergies: No Known Allergies  Family History: Family History  Problem Relation Age of Onset   Heart disease Father    Prostate cancer Neg Hx    Bladder Cancer Neg Hx    Kidney cancer Neg Hx     Social History:  reports that he has never smoked. He has quit using smokeless tobacco.  His  smokeless tobacco use included snuff. He reports that he does not drink alcohol and does not use drugs.   Physical Exam: BP 136/81   Pulse 73   Ht 6' (1.829 m)   Wt 225 lb (102.1 kg)   BMI 30.52 kg/m   Constitutional:  Alert, No acute distress. HEENT: [place] AT Respiratory: Normal respiratory effort, no increased work of breathing. Psychiatric: Normal mood and affect.   Assessment & Plan:    1.  Renal cell carcinoma Comprehensive metabolic panel drawn today No evidence of metastatic disease chest x-ray/abdominal MRI 35-month follow-up with MRI abdomen, chest x-ray, CBC and comprehensive metabolic panel   John JAYSON Barba, MD  [facility] [address] [place], [place] [address] [phone]

## 2024-05-30 LAB — COMPREHENSIVE METABOLIC PANEL WITH GFR
ALT: 30 IU/L (ref 0–44)
AST: 20 IU/L (ref 0–40)
Albumin: 4.4 g/dL (ref 3.9–4.9)
Alkaline Phosphatase: 81 IU/L (ref 44–121)
BUN/Creatinine Ratio: 9 — ABNORMAL LOW (ref 10–24)
BUN: 24 mg/dL (ref 8–27)
Bilirubin Total: 1.6 mg/dL — ABNORMAL HIGH (ref 0.0–1.2)
CO2: 18 mmol/L — ABNORMAL LOW (ref 20–29)
Calcium: 9.1 mg/dL (ref 8.6–10.2)
Chloride: 106 mmol/L (ref 96–106)
Creatinine, Ser: 2.69 mg/dL — ABNORMAL HIGH (ref 0.76–1.27)
Globulin, Total: 1.9 g/dL (ref 1.5–4.5)
Glucose: 150 mg/dL — ABNORMAL HIGH (ref 70–99)
Potassium: 4.8 mmol/L (ref 3.5–5.2)
Sodium: 140 mmol/L (ref 134–144)
Total Protein: 6.3 g/dL (ref 6.0–8.5)
eGFR: 26 mL/min/1.73 — ABNORMAL LOW (ref >59)

## 2024-05-31 ENCOUNTER — Ambulatory Visit: Payer: Self-pay | Admitting: Urology

## 2024-06-18 ENCOUNTER — Other Ambulatory Visit: Payer: Self-pay | Admitting: Physician Assistant

## 2024-06-18 DIAGNOSIS — E782 Mixed hyperlipidemia: Secondary | ICD-10-CM

## 2024-07-15 DIAGNOSIS — I10 Essential (primary) hypertension: Secondary | ICD-10-CM | POA: Diagnosis not present

## 2024-07-15 DIAGNOSIS — N184 Chronic kidney disease, stage 4 (severe): Secondary | ICD-10-CM | POA: Diagnosis not present

## 2024-07-15 DIAGNOSIS — N200 Calculus of kidney: Secondary | ICD-10-CM | POA: Diagnosis not present

## 2024-07-22 DIAGNOSIS — R208 Other disturbances of skin sensation: Secondary | ICD-10-CM | POA: Diagnosis not present

## 2024-07-22 DIAGNOSIS — D0461 Carcinoma in situ of skin of right upper limb, including shoulder: Secondary | ICD-10-CM | POA: Diagnosis not present

## 2024-07-22 DIAGNOSIS — L538 Other specified erythematous conditions: Secondary | ICD-10-CM | POA: Diagnosis not present

## 2024-07-22 DIAGNOSIS — L820 Inflamed seborrheic keratosis: Secondary | ICD-10-CM | POA: Diagnosis not present

## 2024-07-22 DIAGNOSIS — L2989 Other pruritus: Secondary | ICD-10-CM | POA: Diagnosis not present

## 2024-09-16 ENCOUNTER — Ambulatory Visit: Payer: Self-pay

## 2024-09-16 DIAGNOSIS — Z0000 Encounter for general adult medical examination without abnormal findings: Secondary | ICD-10-CM

## 2024-09-16 LAB — POCT URINE DIPSTICK
Bilirubin, UA: NEGATIVE
Glucose, UA: NEGATIVE mg/dL
Ketones, POC UA: NEGATIVE mg/dL
Leukocytes, UA: NEGATIVE
Nitrite, UA: NEGATIVE
Spec Grav, UA: 1.02 (ref 1.010–1.025)
Urobilinogen, UA: 0.2 U/dL
pH, UA: 6 (ref 5.0–8.0)

## 2024-09-17 LAB — CMP12+LP+TP+TSH+6AC+PSA+CBC…
ALT: 42 IU/L (ref 0–44)
AST: 24 IU/L (ref 0–40)
Albumin: 4.6 g/dL (ref 3.9–4.9)
Alkaline Phosphatase: 85 IU/L (ref 47–123)
BUN/Creatinine Ratio: 9 — ABNORMAL LOW (ref 10–24)
BUN: 22 mg/dL (ref 8–27)
Basophils Absolute: 0.1 x10E3/uL (ref 0.0–0.2)
Basos: 1 %
Bilirubin Total: 1.8 mg/dL — ABNORMAL HIGH (ref 0.0–1.2)
Calcium: 9.4 mg/dL (ref 8.6–10.2)
Chloride: 106 mmol/L (ref 96–106)
Chol/HDL Ratio: 3.4 ratio (ref 0.0–5.0)
Cholesterol, Total: 104 mg/dL (ref 100–199)
Creatinine, Ser: 2.51 mg/dL — ABNORMAL HIGH (ref 0.76–1.27)
EOS (ABSOLUTE): 0.2 x10E3/uL (ref 0.0–0.4)
Eos: 2 %
Estimated CHD Risk: 0.5 times avg. (ref 0.0–1.0)
Free Thyroxine Index: 2.2 (ref 1.2–4.9)
GGT: 36 IU/L (ref 0–65)
Globulin, Total: 2.4 g/dL (ref 1.5–4.5)
Glucose: 114 mg/dL — ABNORMAL HIGH (ref 70–99)
HDL: 31 mg/dL — ABNORMAL LOW (ref >39)
Hematocrit: 50.7 % (ref 37.5–51.0)
Hemoglobin: 16.7 g/dL (ref 13.0–17.7)
Immature Grans (Abs): 0.1 x10E3/uL (ref 0.0–0.1)
Immature Granulocytes: 1 %
Iron: 85 ug/dL (ref 38–169)
LDH: 172 IU/L (ref 121–224)
LDL Chol Calc (NIH): 34 mg/dL (ref 0–99)
Lymphocytes Absolute: 2.4 x10E3/uL (ref 0.7–3.1)
Lymphs: 25 %
MCH: 30.5 pg (ref 26.6–33.0)
MCHC: 32.9 g/dL (ref 31.5–35.7)
MCV: 93 fL (ref 79–97)
Monocytes Absolute: 0.8 x10E3/uL (ref 0.1–0.9)
Monocytes: 9 %
Neutrophils Absolute: 5.8 x10E3/uL (ref 1.4–7.0)
Neutrophils: 62 %
Phosphorus: 3.2 mg/dL (ref 2.8–4.1)
Platelets: 259 x10E3/uL (ref 150–450)
Potassium: 4.8 mmol/L (ref 3.5–5.2)
Prostate Specific Ag, Serum: 3.5 ng/mL (ref 0.0–4.0)
RBC: 5.48 x10E6/uL (ref 4.14–5.80)
RDW: 14.2 % (ref 11.6–15.4)
Sodium: 143 mmol/L (ref 134–144)
T3 Uptake Ratio: 31 % (ref 24–39)
T4, Total: 7 ug/dL (ref 4.5–12.0)
TSH: 1.93 u[IU]/mL (ref 0.450–4.500)
Total Protein: 7 g/dL (ref 6.0–8.5)
Triglycerides: 258 mg/dL — ABNORMAL HIGH (ref 0–149)
Uric Acid: 6 mg/dL (ref 3.8–8.4)
VLDL Cholesterol Cal: 39 mg/dL (ref 5–40)
WBC: 9.3 x10E3/uL (ref 3.4–10.8)
eGFR: 28 mL/min/1.73 — ABNORMAL LOW (ref >59)

## 2024-09-20 DIAGNOSIS — M67813 Other specified disorders of tendon, right shoulder: Secondary | ICD-10-CM | POA: Diagnosis not present

## 2024-09-20 DIAGNOSIS — M19011 Primary osteoarthritis, right shoulder: Secondary | ICD-10-CM | POA: Diagnosis not present

## 2024-09-20 DIAGNOSIS — S46211A Strain of muscle, fascia and tendon of other parts of biceps, right arm, initial encounter: Secondary | ICD-10-CM | POA: Diagnosis not present

## 2024-09-20 DIAGNOSIS — M7581 Other shoulder lesions, right shoulder: Secondary | ICD-10-CM | POA: Diagnosis not present

## 2024-09-20 DIAGNOSIS — X58XXXA Exposure to other specified factors, initial encounter: Secondary | ICD-10-CM | POA: Diagnosis not present

## 2024-09-21 LAB — HGB A1C W/O EAG: Hgb A1c MFr Bld: 6.3 % — ABNORMAL HIGH (ref 4.8–5.6)

## 2024-09-21 LAB — SPECIMEN STATUS REPORT

## 2024-09-23 ENCOUNTER — Ambulatory Visit: Payer: Self-pay | Admitting: Physician Assistant

## 2024-09-23 ENCOUNTER — Encounter: Payer: Self-pay | Admitting: Physician Assistant

## 2024-09-23 VITALS — BP 135/74 | HR 87 | Temp 97.6°F | Resp 16 | Ht 72.0 in | Wt 235.0 lb

## 2024-09-23 DIAGNOSIS — Z0000 Encounter for general adult medical examination without abnormal findings: Secondary | ICD-10-CM

## 2024-09-23 NOTE — Progress Notes (Signed)
 Pt presents today to complete physical, pt did not voice any concerns at this time. Gretel Acre

## 2024-09-23 NOTE — Progress Notes (Signed)
 "  [facility]  ____________________________________________   None    (approximate)  I have reviewed the triage vital signs and the nursing notes.   HISTORY  Chief Complaint Annual Exam   HPI John [name] is a 63 y.o. male patient presents for annual physical exam.  Patient with no concerning complaints.         Past Medical History:  Diagnosis Date   Benign prostatic hyperplasia without lower urinary tract symptoms    Chronic kidney disease    Cubital tunnel syndrome on right    Degenerative tear of glenoid labrum of right shoulder    Disorder of calcium  metabolism    Displacement of lumbosacral intervertebral disc with myelopathy    Gilbert's syndrome    Gout involving toe    History of kidney stones    Hyperlipidemia    Hypertension    Lumbar degenerative disc disease    Postlaminectomy syndrome of lumbar region    Right sided sciatica    Rotator cuff tendinitis, right     Patient Active Problem List   Diagnosis Date Noted   Right renal mass 10/16/2023   Benign prostatic hyperplasia without lower urinary tract symptoms 10/29/2018   Gilbert's syndrome 10/29/2018   Gout involving toe 10/29/2018   Displacement of lumbosacral intervertebral disc with myelopathy 01/01/2018   Lumbar degenerative disc disease 01/01/2018   Right sided sciatica 01/01/2018   Postlaminectomy syndrome of lumbar region 01/01/2018   BMI 30.0-30.9,adult 12/28/2017   Chronic kidney disease, stage 3 unspecified (HCC) 12/28/2017   Hyperlipidemia, mixed 12/28/2017   Essential hypertension 12/28/2017   Tobacco use 12/28/2017   Renal stone 10/07/2017   Cubital tunnel syndrome on right 12/12/2016   Degenerative tear of glenoid labrum of right shoulder 10/27/2016   Rotator cuff tendinitis, right 09/09/2016   Disorder of calcium  metabolism 07/09/2012    Past Surgical History:  Procedure Laterality Date   BACK SURGERY     CHOLECYSTECTOMY     CYSTOSCOPY  WITH LITHOLAPAXY N/A 12/18/2018   Procedure: CYSTOSCOPY WITH LITHOLAPAXY;  Surgeon: Twylla Glendia BROCKS, MD;  Location: [facility];  Service: Urology;  Laterality: N/A;   CYSTOSCOPY/URETEROSCOPY/HOLMIUM LASER/STENT PLACEMENT Left 10/12/2017   Procedure: CYSTOSCOPY/URETEROSCOPY/HOLMIUM LASER/STENT PLACEMENT;  Surgeon: Twylla Glendia BROCKS, MD;  Location: [facility];  Service: Urology;  Laterality: Left;   LAPAROSCOPIC NEPHRECTOMY, HAND ASSISTED Right 10/16/2023   Procedure: HAND ASSISTED LAPAROSCOPIC RADICAL NEPHRECTOMY;  Surgeon: Penne Knee, MD;  Location: [facility];  Service: Urology;  Laterality: Right;   LUMBAR FUSION     L5S1   SHOULDER ARTHROSCOPY WITH SUBACROMIAL DECOMPRESSION Right 10/27/2016   Procedure: SHOULDER ARTHROSCOPY WITH SUBACROMIAL DECOMPRESSION  AND DEBRIDEMENT;  Surgeon: Norleen JINNY Maltos, MD;  Location: [facility];  Service: Orthopedics;  Laterality: Right;   SHOULDER SURGERY Right 2003    Prior to Admission medications  Medication Sig Start Date End Date Taking? Authorizing Provider  allopurinol  (ZYLOPRIM ) 100 MG tablet Take 1 tablet (100 mg total) by mouth at bedtime. 10/18/23   Helon Kirsch A, PA-C  losartan  (COZAAR ) 50 MG tablet Take 50 mg by mouth daily. 06/19/21   [provider]  rosuvastatin  (CRESTOR ) 40 MG tablet TAKE 1 TABLET BY MOUTH ONCE A DAY 06/18/24   Claudene Tanda POUR, PA-C  tamsulosin  (FLOMAX ) 0.4 MG CAPS capsule TAKE 1 CAPSULE(0.4 MG) BY MOUTH AT BEDTIME 10/02/23   Stoioff, Glendia BROCKS, MD    Allergies Patient has no known allergies.  Family History  Problem Relation Age of  Onset   Heart disease Father    Prostate cancer Neg Hx    Bladder Cancer Neg Hx    Kidney cancer Neg Hx     Social History Social History[1]  Review of Systems Constitutional: No fever/chills Eyes: No visual changes. ENT: No sore throat. Cardiovascular: Denies chest pain. Respiratory: Denies shortness of breath. Gastrointestinal: No abdominal pain.  No nausea, no vomiting.  No  diarrhea.  No constipation. Genitourinary: Negative for dysuria.  BPH Musculoskeletal: Negative for back pain. Skin: Negative for rash. Neurological: Negative for headaches, focal weakness or numbness. Endocrine: Chronic kidney disease and hyperlipidemia.  ____________________________________________   PHYSICAL EXAM:  VITAL SIGNS: BP 135/74  Cuff Size Large  Pulse Rate 87  Temp 97.6 F (36.4 C)  Temp Source Temporal  Weight 235 lb (106.6 kg)  Height 6' (1.829 m)  Resp 16  SpO2 97 %   BMI: 31.87 kg/m2  BSA: 2.33 m2    Constitutional: Alert and oriented. Well appearing and in no acute distress. Eyes: Conjunctivae are normal. PERRL. EOMI. Head: Atraumatic. Nose: No congestion/rhinnorhea. Mouth/Throat: Mucous membranes are moist.  Oropharynx non-erythematous. Neck: No stridor.  No cervical spine tenderness to palpation. Hematological/Lymphatic/Immunilogical: No cervical lymphadenopathy. Cardiovascular: Normal rate, regular rhythm. Grossly normal heart sounds.  Good peripheral circulation. Respiratory: Normal respiratory effort.  No retractions. Lungs CTAB. Gastrointestinal: Soft and nontender. No distention. No abdominal bruits. No CVA tenderness. Genitourinary: Deferred Musculoskeletal: No lower extremity tenderness nor edema.  No joint effusions. Neurologic:  Normal speech and language. No gross focal neurologic deficits are appreciated. No gait instability. Skin:  Skin is warm, dry and intact. No rash noted. Psychiatric: Mood and affect are normal. Speech and behavior are normal.  ____________________________________________   LABS _          Component Ref Range & Units (hover) 7 d ago (09/16/24) 1 yr ago (06/27/23) 1 yr ago (06/09/23) 1 yr ago (05/09/23) 2 yr ago (09/16/22) 3 yr ago (07/30/21) 3 yr ago (11/30/20)  Color, UA yellow Yellow R Red Abnormal  R, CM yellow R Dark Yellow R Dark Yellow R yellow R  Clarity, UA clear   clear R Clear R Clear R cloudy R   Glucose, UA negative   Negative R Negative R Negative R Negative R  Bilirubin, UA negative Negative R  neg R Negative R Negative R negative R  Ketones, POC UA negative Negative R CANCELED R, CM neg R Negative R Negative R negative R  Spec Grav, UA 1.020 1.025 R CANCELED R, CM 1.020 >=1.030 Abnormal  1.025 >=1.030 Abnormal   Blood, UA trace-lysed Abnormal  Trace Abnormal  R  neg R Positive R, CM Positive R, CM +- R, CM  Comment: 1+  pH, UA 6.0 5.5 R CANCELED R, CM 6.0 5.5 6.0 5.5  POC PROTEIN,UA trace        Urobilinogen, UA 0.2   0.2 0.2 0.2 0.2  Nitrite, UA Negative Negative  neg R Negative R Negative R negative R  Leukocytes, UA Negative Negative  Negative Negative Negative Negative                         Component Ref Range & Units (hover) 7 d ago (09/16/24) 3 mo ago (05/29/24) 11 mo ago (10/18/23) 11 mo ago (10/18/23) 11 mo ago (10/17/23) 11 mo ago (10/17/23) 11 mo ago (10/10/23) 11 mo ago (10/10/23)  Glucose 114 High  150 High  127 High  CM  150  High  CM  105 High  CM   Uric Acid 6.0         Comment:            Therapeutic target for gout patients: <6.0  BUN 22 24 23  R  21 R  15 R   Creatinine, Ser 2.51 High  2.69 High  2.39 High  R  2.15 High  R  1.41 High  R   eGFR 28 Low  26 Low         BUN/Creatinine Ratio 9 Low  9 Low         Sodium 143 140 135 R  133 Low  R  139 R   Potassium 4.8 4.8 4.1 R  4.8 R  4.2 R   Chloride 106 106 103 R  102 R  103 R   Calcium  9.4 9.1 8.1 Low  R  8.2 Low  R  9.4 R   Phosphorus 3.2         Total Protein 7.0 6.3     8.1 R   Albumin 4.6 4.4     4.2 R   Globulin, Total 2.4 1.9        Bilirubin Total 1.8 High  1.6 High      1.9 High    Alkaline Phosphatase 85 81 R     76 R   LDH 172         AST 24 20     27  R   ALT 42 30     41 R   GGT 36         Iron 85         Cholesterol, Total 104         Triglycerides 258 High          HDL 31 Low          VLDL Cholesterol Cal 39         LDL Chol Calc (NIH) 34         Chol/HDL Ratio 3.4          Comment:                                   T. Chol/HDL Ratio                                             Men  Women                               1/2 Avg.Risk  3.4    3.3                                   Avg.Risk  5.0    4.4                                2X Avg.Risk  9.6    7.1                                3X Avg.Risk 23.4  11.0  Estimated CHD Risk 0.5         Comment: The CHD Risk is based on the T. Chol/HDL ratio. Other factors affect CHD Risk such as hypertension, smoking, diabetes, severe obesity, and family history of premature CHD.  TSH 1.930         T4, Total 7.0         T3 Uptake Ratio 31         Free Thyroxine Index 2.2         Prostate Specific Ag, Serum 3.5         Comment: Roche ECLIA methodology. According to the American Urological Association, Serum PSA should decrease and remain at undetectable levels after radical prostatectomy. The AUA defines biochemical recurrence as an initial PSA value 0.2 ng/mL or greater followed by a subsequent confirmatory PSA value 0.2 ng/mL or greater. Values obtained with different assay methods or kits cannot be used interchangeably. Results cannot be interpreted as absolute evidence of the presence or absence of malignant disease.  WBC 9.3   13.7 High  R  20.8 High  R  9.2 R  RBC 5.48   4.47 R  4.97 R  5.93 High  R  Hemoglobin 16.7   12.8 Low  R  14.3 R  16.7 R  Hematocrit 50.7   39.0 R  43.1 R  51.2 R  MCV 93   87.2 R  86.7 R  86.3 R  MCH 30.5   28.6 R  28.8 R  28.2 R  MCHC 32.9   32.8 R  33.2 R  32.6 R  RDW 14.2   16.0 High  R  15.4 R  15.1 R  Platelets 259   233 R  284 R  300 R  Neutrophils 62         Lymphs 25         Monocytes 9         Eos 2         Basos 1         Neutrophils Absolute 5.8         Lymphocytes Absolute 2.4         Monocytes Absolute 0.8         EOS (ABSOLUTE) 0.2         Basophils Absolute 0.1         Immature Granulocytes 1         Immature Grans (Abs) 0.1                       _____________________________  EKG  Sinus rhythm at 77 bpm ____________________________________________    ____________________________________________   INITIAL IMPRESSION / ASSESSMENT AND PLAN  As part of my medical decision making, I reviewed the following data within the electronic MEDICAL RECORD NUMBER      No acute findings on physical exam and EKG.  Triglycerides elevated.  Patient patient will be transferring to Medicare and advised to follow-up with treating doctor for chronic kidney disease.      ____________________________________________   FINAL CLINICAL IMPRESSION  Well exam   ED Discharge Orders     None        Note:  This document was prepared using Dragon voice recognition software and may include unintentional dictation errors.     [1]  Social History Tobacco Use   Smoking status: Never   Smokeless tobacco: Former    Types: Snuff   Tobacco comments:  Quit 08/2021  Vaping Use   Vaping status: Never Used  Substance Use Topics   Alcohol use: No   Drug use: No   "

## 2024-10-01 ENCOUNTER — Other Ambulatory Visit: Payer: Self-pay | Admitting: Physician Assistant

## 2024-10-01 DIAGNOSIS — E782 Mixed hyperlipidemia: Secondary | ICD-10-CM

## 2024-10-31 ENCOUNTER — Ambulatory Visit

## 2024-11-04 ENCOUNTER — Ambulatory Visit
Admission: RE | Admit: 2024-11-04 | Discharge: 2024-11-04 | Disposition: A | Source: Ambulatory Visit | Attending: Urology | Admitting: Urology

## 2024-11-04 ENCOUNTER — Other Ambulatory Visit: Payer: Self-pay | Admitting: Urology

## 2024-11-04 DIAGNOSIS — C641 Malignant neoplasm of right kidney, except renal pelvis: Secondary | ICD-10-CM

## 2024-11-04 DIAGNOSIS — C649 Malignant neoplasm of unspecified kidney, except renal pelvis: Secondary | ICD-10-CM

## 2024-11-04 MED ORDER — GADOBUTROL 1 MMOL/ML IV SOLN
10.0000 mL | Freq: Once | INTRAVENOUS | Status: AC | PRN
  Administered 2024-11-04: 10 mL via INTRAVENOUS

## 2024-11-07 ENCOUNTER — Ambulatory Visit: Payer: Self-pay | Admitting: Urology

## 2024-11-08 ENCOUNTER — Other Ambulatory Visit: Payer: Self-pay | Admitting: *Deleted

## 2024-11-08 DIAGNOSIS — C641 Malignant neoplasm of right kidney, except renal pelvis: Secondary | ICD-10-CM

## 2024-11-28 ENCOUNTER — Ambulatory Visit: Admitting: Family Medicine

## 2024-11-29 ENCOUNTER — Other Ambulatory Visit
